# Patient Record
Sex: Female | Born: 1990 | Race: White | Hispanic: No | Marital: Married | Smoking: Never smoker
Health system: Southern US, Community
[De-identification: ages and names within clinical notes are randomized; demographics above are authoritative.]

## PROBLEM LIST (undated history)

## (undated) ENCOUNTER — Inpatient Hospital Stay (HOSPITAL_COMMUNITY): Payer: Self-pay

## (undated) DIAGNOSIS — O1490 Unspecified pre-eclampsia, unspecified trimester: Secondary | ICD-10-CM

## (undated) DIAGNOSIS — Z87442 Personal history of urinary calculi: Secondary | ICD-10-CM

## (undated) DIAGNOSIS — Z8759 Personal history of other complications of pregnancy, childbirth and the puerperium: Secondary | ICD-10-CM

## (undated) DIAGNOSIS — N921 Excessive and frequent menstruation with irregular cycle: Secondary | ICD-10-CM

## (undated) DIAGNOSIS — O2300 Infections of kidney in pregnancy, unspecified trimester: Secondary | ICD-10-CM

## (undated) DIAGNOSIS — D649 Anemia, unspecified: Secondary | ICD-10-CM

## (undated) DIAGNOSIS — N200 Calculus of kidney: Secondary | ICD-10-CM

## (undated) DIAGNOSIS — K5900 Constipation, unspecified: Secondary | ICD-10-CM

## (undated) DIAGNOSIS — N939 Abnormal uterine and vaginal bleeding, unspecified: Secondary | ICD-10-CM

## (undated) DIAGNOSIS — N83209 Unspecified ovarian cyst, unspecified side: Secondary | ICD-10-CM

## (undated) DIAGNOSIS — O139 Gestational [pregnancy-induced] hypertension without significant proteinuria, unspecified trimester: Secondary | ICD-10-CM

## (undated) HISTORY — PX: DILATION AND CURETTAGE OF UTERUS: SHX78

## (undated) HISTORY — DX: Gestational (pregnancy-induced) hypertension without significant proteinuria, unspecified trimester: O13.9

## (undated) HISTORY — DX: Unspecified pre-eclampsia, unspecified trimester: O14.90

## (undated) HISTORY — DX: Calculus of kidney: N20.0

## (undated) HISTORY — PX: TONSILLECTOMY: SUR1361

## (undated) HISTORY — PX: WISDOM TOOTH EXTRACTION: SHX21

---
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
## 2008-06-18 ENCOUNTER — Emergency Department: Payer: Self-pay | Admitting: Unknown Physician Specialty

## 2008-10-05 HISTORY — PX: TONSILLECTOMY AND ADENOIDECTOMY: SUR1326

## 2009-06-19 ENCOUNTER — Ambulatory Visit: Payer: Self-pay | Admitting: Otolaryngology

## 2009-11-22 ENCOUNTER — Ambulatory Visit: Payer: Self-pay | Admitting: Family Medicine

## 2010-01-16 ENCOUNTER — Ambulatory Visit: Payer: Self-pay | Admitting: Obstetrics & Gynecology

## 2010-01-20 ENCOUNTER — Encounter: Payer: Self-pay | Admitting: Obstetrics & Gynecology

## 2010-01-20 ENCOUNTER — Ambulatory Visit: Payer: Self-pay | Admitting: Obstetrics and Gynecology

## 2010-01-20 LAB — CONVERTED CEMR LAB
Antibody Screen: NEGATIVE
Basophils Absolute: 0 10*3/uL (ref 0.0–0.1)
Eosinophils Relative: 1 % (ref 0–5)
HCT: 42.5 % (ref 36.0–46.0)
Hemoglobin: 13.9 g/dL (ref 12.0–15.0)
Lymphocytes Relative: 22 % (ref 12–46)
MCHC: 32.7 g/dL (ref 30.0–36.0)
Monocytes Absolute: 0.4 10*3/uL (ref 0.1–1.0)
RDW: 15.7 % — ABNORMAL HIGH (ref 11.5–15.5)
Rh Type: NEGATIVE
hCG, Beta Chain, Quant, S: 212.6 milliintl units/mL

## 2010-02-03 ENCOUNTER — Encounter: Payer: Self-pay | Admitting: Obstetrics & Gynecology

## 2010-02-03 ENCOUNTER — Ambulatory Visit: Payer: Self-pay | Admitting: Obstetrics & Gynecology

## 2010-02-13 ENCOUNTER — Ambulatory Visit (HOSPITAL_COMMUNITY)
Admission: RE | Admit: 2010-02-13 | Discharge: 2010-02-13 | Payer: Self-pay | Source: Home / Self Care | Admitting: Obstetrics & Gynecology

## 2010-02-17 ENCOUNTER — Ambulatory Visit: Payer: Self-pay | Admitting: Obstetrics and Gynecology

## 2010-02-27 ENCOUNTER — Ambulatory Visit: Payer: Self-pay | Admitting: Obstetrics & Gynecology

## 2010-03-24 ENCOUNTER — Ambulatory Visit: Payer: Self-pay | Admitting: Obstetrics & Gynecology

## 2010-04-23 ENCOUNTER — Ambulatory Visit: Payer: Self-pay | Admitting: Obstetrics and Gynecology

## 2010-04-24 ENCOUNTER — Encounter: Payer: Self-pay | Admitting: Obstetrics & Gynecology

## 2010-04-30 ENCOUNTER — Ambulatory Visit (HOSPITAL_COMMUNITY)
Admission: RE | Admit: 2010-04-30 | Discharge: 2010-04-30 | Payer: Self-pay | Source: Home / Self Care | Admitting: Obstetrics & Gynecology

## 2010-05-22 ENCOUNTER — Ambulatory Visit: Payer: Self-pay | Admitting: Obstetrics & Gynecology

## 2010-06-24 ENCOUNTER — Ambulatory Visit: Payer: Self-pay | Admitting: Family Medicine

## 2010-06-24 ENCOUNTER — Encounter (INDEPENDENT_AMBULATORY_CARE_PROVIDER_SITE_OTHER): Payer: Self-pay | Admitting: *Deleted

## 2010-06-24 LAB — CONVERTED CEMR LAB
MCHC: 31.9 g/dL (ref 30.0–36.0)
MCV: 83.3 fL (ref 78.0–100.0)
Platelets: 285 10*3/uL (ref 150–400)
RDW: 14.8 % (ref 11.5–15.5)
WBC: 12 10*3/uL — ABNORMAL HIGH (ref 4.0–10.5)

## 2010-07-09 ENCOUNTER — Ambulatory Visit: Payer: Self-pay | Admitting: Family Medicine

## 2010-07-11 ENCOUNTER — Ambulatory Visit: Payer: Self-pay | Admitting: Family Medicine

## 2010-07-30 ENCOUNTER — Ambulatory Visit: Payer: Self-pay | Admitting: Obstetrics & Gynecology

## 2010-08-04 ENCOUNTER — Inpatient Hospital Stay (HOSPITAL_COMMUNITY): Admission: AD | Admit: 2010-08-04 | Discharge: 2010-08-04 | Payer: Self-pay | Admitting: Obstetrics and Gynecology

## 2010-08-04 ENCOUNTER — Ambulatory Visit: Payer: Self-pay | Admitting: Obstetrics and Gynecology

## 2010-08-05 ENCOUNTER — Encounter (INDEPENDENT_AMBULATORY_CARE_PROVIDER_SITE_OTHER): Payer: Self-pay | Admitting: *Deleted

## 2010-08-06 ENCOUNTER — Ambulatory Visit: Payer: Self-pay | Admitting: Obstetrics & Gynecology

## 2010-08-07 ENCOUNTER — Encounter (INDEPENDENT_AMBULATORY_CARE_PROVIDER_SITE_OTHER): Payer: Self-pay | Admitting: *Deleted

## 2010-08-07 ENCOUNTER — Ambulatory Visit: Payer: Self-pay | Admitting: Obstetrics & Gynecology

## 2010-08-07 LAB — CONVERTED CEMR LAB
Collection Interval-CRCL: 24 hr
Protein, Ur: 108 mg/24hr — ABNORMAL HIGH (ref 50–100)

## 2010-08-08 ENCOUNTER — Ambulatory Visit: Payer: Self-pay | Admitting: Obstetrics & Gynecology

## 2010-08-12 ENCOUNTER — Ambulatory Visit (HOSPITAL_COMMUNITY)
Admission: RE | Admit: 2010-08-12 | Discharge: 2010-08-12 | Payer: Self-pay | Source: Home / Self Care | Admitting: Family Medicine

## 2010-08-14 ENCOUNTER — Ambulatory Visit: Payer: Self-pay | Admitting: Obstetrics & Gynecology

## 2010-08-21 ENCOUNTER — Ambulatory Visit: Payer: Self-pay | Admitting: Obstetrics & Gynecology

## 2010-09-02 ENCOUNTER — Encounter (INDEPENDENT_AMBULATORY_CARE_PROVIDER_SITE_OTHER): Payer: Self-pay | Admitting: *Deleted

## 2010-09-02 ENCOUNTER — Ambulatory Visit: Payer: Self-pay | Admitting: Obstetrics & Gynecology

## 2010-09-02 LAB — CONVERTED CEMR LAB
Chlamydia, DNA Probe: NEGATIVE
GC Probe Amp, Genital: NEGATIVE

## 2010-09-08 ENCOUNTER — Ambulatory Visit: Payer: Self-pay | Admitting: Obstetrics and Gynecology

## 2010-09-11 ENCOUNTER — Ambulatory Visit: Payer: Self-pay | Admitting: Obstetrics & Gynecology

## 2010-09-14 ENCOUNTER — Inpatient Hospital Stay (HOSPITAL_COMMUNITY)
Admission: AD | Admit: 2010-09-14 | Discharge: 2010-09-14 | Payer: Self-pay | Source: Home / Self Care | Attending: Family Medicine | Admitting: Family Medicine

## 2010-09-16 ENCOUNTER — Ambulatory Visit: Payer: Self-pay | Admitting: Family Medicine

## 2010-09-17 ENCOUNTER — Ambulatory Visit: Payer: Self-pay | Admitting: Obstetrics & Gynecology

## 2010-09-18 ENCOUNTER — Inpatient Hospital Stay (HOSPITAL_COMMUNITY)
Admission: AD | Admit: 2010-09-18 | Discharge: 2010-09-21 | Payer: Self-pay | Source: Home / Self Care | Attending: Obstetrics & Gynecology | Admitting: Obstetrics & Gynecology

## 2010-09-18 ENCOUNTER — Ambulatory Visit: Payer: Self-pay | Admitting: Obstetrics and Gynecology

## 2010-10-26 ENCOUNTER — Encounter: Payer: Self-pay | Admitting: Obstetrics & Gynecology

## 2010-11-06 ENCOUNTER — Ambulatory Visit: Payer: Self-pay | Admitting: Obstetrics & Gynecology

## 2010-11-18 ENCOUNTER — Encounter: Payer: Self-pay | Admitting: Nurse Practitioner

## 2010-12-15 LAB — RH IMMUNE GLOB WKUP(>/=20WKS)(NOT WOMEN'S HOSP)
Fetal Screen: NEGATIVE
Unit division: 0

## 2010-12-15 LAB — PROTEIN / CREATININE RATIO, URINE
Creatinine, Urine: 116.8 mg/dL
Total Protein, Urine: 132 mg/dL

## 2010-12-15 LAB — URINALYSIS, ROUTINE W REFLEX MICROSCOPIC
Nitrite: NEGATIVE
Protein, ur: 100 mg/dL — AB
Urobilinogen, UA: 0.2 mg/dL (ref 0.0–1.0)

## 2010-12-15 LAB — CBC
HCT: 25.8 % — ABNORMAL LOW (ref 36.0–46.0)
Hemoglobin: 10.1 g/dL — ABNORMAL LOW (ref 12.0–15.0)
Hemoglobin: 9.8 g/dL — ABNORMAL LOW (ref 12.0–15.0)
MCH: 23.6 pg — ABNORMAL LOW (ref 26.0–34.0)
MCH: 23.7 pg — ABNORMAL LOW (ref 26.0–34.0)
MCHC: 32 g/dL (ref 30.0–36.0)
MCV: 72.7 fL — ABNORMAL LOW (ref 78.0–100.0)
Platelets: 332 10*3/uL (ref 150–400)
RBC: 3.55 MIL/uL — ABNORMAL LOW (ref 3.87–5.11)
RBC: 4.15 MIL/uL (ref 3.87–5.11)
RDW: 16.4 % — ABNORMAL HIGH (ref 11.5–15.5)
WBC: 14.8 10*3/uL — ABNORMAL HIGH (ref 4.0–10.5)

## 2010-12-15 LAB — URINE MICROSCOPIC-ADD ON

## 2010-12-15 LAB — COMPREHENSIVE METABOLIC PANEL
AST: 12 U/L (ref 0–37)
CO2: 24 mEq/L (ref 19–32)
Calcium: 9.2 mg/dL (ref 8.4–10.5)
Creatinine, Ser: 0.78 mg/dL (ref 0.4–1.2)
GFR calc Af Amer: >60 mL/min (ref >60)
GFR calc non Af Amer: >60 mL/min (ref >60)
Total Protein: 7.4 g/dL (ref 6.0–8.3)

## 2010-12-15 LAB — RPR: RPR Ser Ql: NONREACTIVE

## 2010-12-16 LAB — URINALYSIS, ROUTINE W REFLEX MICROSCOPIC
Bilirubin Urine: NEGATIVE
Glucose, UA: NEGATIVE mg/dL
Ketones, ur: NEGATIVE mg/dL
pH: 6 (ref 5.0–8.0)

## 2010-12-16 LAB — URINE MICROSCOPIC-ADD ON

## 2010-12-17 LAB — URINALYSIS, ROUTINE W REFLEX MICROSCOPIC
Bilirubin Urine: NEGATIVE
Glucose, UA: NEGATIVE mg/dL
Hgb urine dipstick: NEGATIVE
Specific Gravity, Urine: 1.01 (ref 1.005–1.030)
Urobilinogen, UA: 0.2 mg/dL (ref 0.0–1.0)
pH: 7 (ref 5.0–8.0)

## 2010-12-17 LAB — COMPREHENSIVE METABOLIC PANEL
ALT: 14 U/L (ref 0–35)
Albumin: 2.9 g/dL — ABNORMAL LOW (ref 3.5–5.2)
Alkaline Phosphatase: 80 U/L (ref 39–117)
BUN: 2 mg/dL — ABNORMAL LOW (ref 6–23)
Chloride: 103 mEq/L (ref 96–112)
Potassium: 4 mEq/L (ref 3.5–5.1)
Total Bilirubin: 0.3 mg/dL (ref 0.3–1.2)

## 2010-12-17 LAB — URINE MICROSCOPIC-ADD ON

## 2010-12-17 LAB — CBC
HCT: 32.5 % — ABNORMAL LOW (ref 36.0–46.0)
MCV: 80.2 fL (ref 78.0–100.0)
Platelets: 299 10*3/uL (ref 150–400)
RBC: 4.05 MIL/uL (ref 3.87–5.11)
WBC: 12.5 10*3/uL — ABNORMAL HIGH (ref 4.0–10.5)

## 2010-12-17 LAB — TSH: TSH: 1.537 u[IU]/mL (ref 0.350–4.500)

## 2011-12-15 ENCOUNTER — Ambulatory Visit (INDEPENDENT_AMBULATORY_CARE_PROVIDER_SITE_OTHER): Payer: BC Managed Care – PPO | Admitting: Family Medicine

## 2011-12-15 ENCOUNTER — Encounter: Payer: Self-pay | Admitting: Family Medicine

## 2011-12-15 DIAGNOSIS — Z1272 Encounter for screening for malignant neoplasm of vagina: Secondary | ICD-10-CM

## 2011-12-15 DIAGNOSIS — G43909 Migraine, unspecified, not intractable, without status migrainosus: Secondary | ICD-10-CM

## 2011-12-15 DIAGNOSIS — Z01419 Encounter for gynecological examination (general) (routine) without abnormal findings: Secondary | ICD-10-CM

## 2011-12-15 DIAGNOSIS — N920 Excessive and frequent menstruation with regular cycle: Secondary | ICD-10-CM

## 2011-12-15 DIAGNOSIS — Z113 Encounter for screening for infections with a predominantly sexual mode of transmission: Secondary | ICD-10-CM

## 2011-12-15 NOTE — Progress Notes (Signed)
Here today for GYN exam.  Needs pap smear and STD testing. She had a recent episode of bleeding for many days and 2 cycles in one month.  Previous cycles are average, monthly.  She is using condoms for [facility].  Does not think she is pregnant.  Past Medical History  Diagnosis Date  . Preeclampsia    History reviewed. No pertinent past surgical history. Family History  Problem Relation Age of Onset  . Cancer Mother     cervial  . Ovarian cysts Mother   . Cancer Maternal Aunt     cervical  . Cancer Maternal Grandmother 40    Cervical and Breast one breast  . Ovarian cysts Maternal Grandmother    History   Social History  . Marital Status: Married    Spouse Name: N/A    Number of Children: N/A  . Years of Education: N/A   Social History Main Topics  . Smoking status: Never Smoker   . Smokeless tobacco: Never Used  . Alcohol Use: No  . Drug Use: No  . Sexually Active: Yes -- Female partner(s)    Birth Control/ Protection: Condom   Other Topics Concern  . None   Social History Narrative  . None   No current outpatient prescriptions on file prior to visit.   Allergies  Allergen Reactions  . Codeine Hives   Review of Systems  Constitutional: Negative for fever, chills, weight loss and malaise/fatigue.  HENT: Negative for congestion.   Respiratory: Negative for sputum production and wheezing.   Cardiovascular: Negative for palpitations and leg swelling.  Gastrointestinal: Negative for nausea, vomiting, abdominal pain and blood in stool.  Genitourinary: Negative for dysuria, urgency and hematuria.  Musculoskeletal: Negative for back pain and joint pain.  Neurological: Negative for dizziness, speech change, focal weakness and loss of consciousness.  Psychiatric/Behavioral: Negative for depression. The patient is not nervous/anxious.   Physical Exam  Vitals reviewed. Constitutional: She is oriented to person, place, and time. She appears well-developed and well-nourished.    HENT:  Head: Normocephalic and atraumatic.  Eyes: No scleral icterus.  Neck: Neck supple. No thyromegaly present.  Cardiovascular: Normal rate and regular rhythm.   No murmur heard. Pulmonary/Chest: Effort normal and breath sounds normal. She has no wheezes.  Abdominal: Soft. She exhibits no distension. There is no tenderness.  Musculoskeletal: Normal range of motion.  Neurological: She is alert and oriented to person, place, and time.  Skin: Skin is warm and dry. No rash noted.  Psychiatric: She has a normal mood and affect.  Breasts - breasts appear normal, no suspicious masses, no skin or nipple changes or axillary nodes Pelvic - normal external genitalia, vulva, vagina, cervix, uterus and adnexa, VULVA: normal appearing vulva with no masses, tenderness or lesions, VAGINA: normal appearing vagina with normal color and discharge, no lesions, CERVIX: normal appearing cervix without discharge or lesions, UTERUS: uterus is normal size, shape, consistency and nontender, ADNEXA: normal adnexa in size, nontender and no masses  1. Migraine    2. Menorrhagia  TSH  3. Routine gynecological examination  Cytology - PAP   Watch cycles, if continue to be abnl, consider hormonal manipulation.

## 2011-12-15 NOTE — Progress Notes (Signed)
Patient is here today because she had two cycles this past month.  She bled for 17 days accompanied by fatigue.  She is getting hot flashes that make her feel like she has a fever.  She also gets a bad headache along with all of this.

## 2011-12-15 NOTE — Patient Instructions (Signed)

## 2011-12-16 LAB — TSH: TSH: 1.784 u[IU]/mL (ref 0.350–4.500)

## 2011-12-16 NOTE — Progress Notes (Signed)
Try calling patient . Patient phone number not in service.

## 2012-01-12 ENCOUNTER — Ambulatory Visit: Payer: BC Managed Care – PPO | Admitting: Family Medicine

## 2012-06-28 ENCOUNTER — Ambulatory Visit: Payer: BC Managed Care – PPO | Admitting: Obstetrics & Gynecology

## 2012-10-05 NOTE — L&D Delivery Note (Signed)
Delivery Note At 8:04 AM a viable female was delivered via Vaginal, Spontaneous Delivery (Presentation: ; Occiput Anterior).  APGAR: 9, 9; weight 6 lb 12.5 oz (3075 g).   Placenta status: Intact, Spontaneous.  Cord: 3 vessels with the following complications: None.  Cord pH: NA  Anesthesia: Epidural  Episiotomy: None Lacerations: Vaginal, hemostatic, no repair Suture Repair: NA Est. Blood Loss (mL): 200  Mom to postpartum.  Baby to nursery-stable. Placenta to: BS Feeding: Breast Circ: NA  Jmarion Christiano 06/07/2013, 9:37 AM

## 2012-10-31 ENCOUNTER — Ambulatory Visit: Payer: BC Managed Care – PPO | Admitting: Gynecology

## 2012-10-31 VITALS — BP 112/87 | Wt 164.0 lb

## 2012-10-31 DIAGNOSIS — Z3480 Encounter for supervision of other normal pregnancy, unspecified trimester: Secondary | ICD-10-CM

## 2012-10-31 LAB — POCT URINE PREGNANCY: Preg Test, Ur: NEGATIVE

## 2012-10-31 NOTE — Patient Instructions (Signed)
Patient was seen today for prenatal work up visit. Blood work was done and patient history was taken. Patient has a follow up appointment on 11/15/12 to see the doctor for her initial visit.

## 2012-11-01 ENCOUNTER — Encounter: Payer: Self-pay | Admitting: Family Medicine

## 2012-11-01 DIAGNOSIS — Z6791 Unspecified blood type, Rh negative: Secondary | ICD-10-CM | POA: Insufficient documentation

## 2012-11-01 DIAGNOSIS — O26899 Other specified pregnancy related conditions, unspecified trimester: Secondary | ICD-10-CM

## 2012-11-01 LAB — CULTURE, URINE COMPREHENSIVE
Colony Count: NO GROWTH
Organism ID, Bacteria: NO GROWTH

## 2012-11-02 LAB — OBSTETRIC PANEL
Basophils Absolute: 0.1 10*3/uL (ref 0.0–0.1)
Hepatitis B Surface Ag: NEGATIVE
Lymphocytes Relative: 26 % (ref 12–46)
Lymphs Abs: 2.2 10*3/uL (ref 0.7–4.0)
Neutrophils Relative %: 65 % (ref 43–77)
Platelets: 289 10*3/uL (ref 150–400)
RBC: 5.01 MIL/uL (ref 3.87–5.11)
RDW: 15.3 % (ref 11.5–15.5)
Rubella: 13.4 Index — ABNORMAL HIGH (ref <0.90)
WBC: 8.5 10*3/uL (ref 4.0–10.5)

## 2012-11-04 LAB — CYSTIC FIBROSIS DIAGNOSTIC STUDY

## 2012-11-13 ENCOUNTER — Ambulatory Visit: Payer: Self-pay | Admitting: Family Medicine

## 2012-11-14 ENCOUNTER — Other Ambulatory Visit: Payer: Self-pay | Admitting: Obstetrics & Gynecology

## 2012-11-14 ENCOUNTER — Telehealth: Payer: Self-pay | Admitting: Gynecology

## 2012-11-14 ENCOUNTER — Ambulatory Visit (HOSPITAL_COMMUNITY)
Admission: RE | Admit: 2012-11-14 | Discharge: 2012-11-14 | Disposition: A | Discharge status: Home / Self Care | Payer: 59 | Source: Ambulatory Visit | Attending: Obstetrics & Gynecology | Admitting: Obstetrics & Gynecology

## 2012-11-14 DIAGNOSIS — Z3480 Encounter for supervision of other normal pregnancy, unspecified trimester: Secondary | ICD-10-CM

## 2012-11-14 DIAGNOSIS — O3680X0 Pregnancy with inconclusive fetal viability, not applicable or unspecified: Secondary | ICD-10-CM | POA: Insufficient documentation

## 2012-11-14 DIAGNOSIS — K829 Disease of gallbladder, unspecified: Secondary | ICD-10-CM

## 2012-11-14 DIAGNOSIS — O2620 Pregnancy care for patient with recurrent pregnancy loss, unspecified trimester: Secondary | ICD-10-CM | POA: Insufficient documentation

## 2012-11-14 NOTE — Telephone Encounter (Signed)
Message receive from Dr. Elsie Lincoln to order an tranvag. ultrasoud and ref. Patient to [facility].

## 2012-11-15 ENCOUNTER — Encounter: Payer: Self-pay | Admitting: Family Medicine

## 2012-11-15 ENCOUNTER — Ambulatory Visit (INDEPENDENT_AMBULATORY_CARE_PROVIDER_SITE_OTHER): Payer: 59 | Admitting: Family Medicine

## 2012-11-15 VITALS — BP 124/92 | Wt 163.0 lb

## 2012-11-15 DIAGNOSIS — O360990 Maternal care for other rhesus isoimmunization, unspecified trimester, not applicable or unspecified: Secondary | ICD-10-CM

## 2012-11-15 DIAGNOSIS — K8020 Calculus of gallbladder without cholecystitis without obstruction: Secondary | ICD-10-CM | POA: Insufficient documentation

## 2012-11-15 DIAGNOSIS — Z23 Encounter for immunization: Secondary | ICD-10-CM

## 2012-11-15 DIAGNOSIS — Z3480 Encounter for supervision of other normal pregnancy, unspecified trimester: Secondary | ICD-10-CM

## 2012-11-15 DIAGNOSIS — O0990 Supervision of high risk pregnancy, unspecified, unspecified trimester: Secondary | ICD-10-CM | POA: Insufficient documentation

## 2012-11-15 DIAGNOSIS — O09299 Supervision of pregnancy with other poor reproductive or obstetric history, unspecified trimester: Secondary | ICD-10-CM | POA: Insufficient documentation

## 2012-11-15 DIAGNOSIS — O26899 Other specified pregnancy related conditions, unspecified trimester: Secondary | ICD-10-CM

## 2012-11-15 MED ORDER — INFLUENZA VIRUS VACC SPLIT PF IM SUSP: 0.5000 mL | Freq: Once | INTRAMUSCULAR | Status: DC

## 2012-11-15 NOTE — Patient Instructions (Addendum)
Pregnancy - First Trimester During sexual intercourse, millions of sperm go into the vagina. Only 1 sperm will penetrate and fertilize the female egg while it is in the Fallopian tube. One week later, the fertilized egg implants into the wall of the uterus. An embryo begins to develop into a baby. At 6 to 8 weeks, the eyes and face are formed and the heartbeat can be seen on ultrasound. At the end of 12 weeks (first trimester), all the baby's organs are formed. Now that you are pregnant, you will want to do everything you can to have a healthy baby. Two of the most important things are to get good prenatal care and follow your caregiver's instructions. Prenatal care is all the medical care you receive before the baby's birth. It is given to prevent, find, and treat problems during the pregnancy and childbirth. PRENATAL EXAMS  During prenatal visits, your weight, blood pressure and urine are checked. This is done to make sure you are healthy and progressing normally during the pregnancy.  A pregnant woman should gain 25 to 35 pounds during the pregnancy. However, if you are over weight or underweight, your caregiver will advise you regarding your weight.  Your caregiver will ask and answer questions for you.  Blood work, cervical cultures, other necessary tests and a Pap test are done during your prenatal exams. These tests are done to check on your health and the probable health of your baby. Tests are strongly recommended and done for HIV with your permission. This is the virus that causes AIDS. These tests are done because medications can be given to help prevent your baby from being born with this infection should you have been infected without knowing it. Blood work is also used to find out your blood type, previous infections and follow your blood levels (hemoglobin).  Low hemoglobin (anemia) is common during pregnancy. Iron and vitamins are given to help prevent this. Later in the pregnancy,  blood tests for diabetes will be done along with any other tests if any problems develop. You may need tests to make sure you and the baby are doing well.  You may need other tests to make sure you and the baby are doing well. CHANGES DURING THE FIRST TRIMESTER (THE FIRST 3 MONTHS OF PREGNANCY) Your body goes through many changes during pregnancy. They vary from person to person. Talk to your caregiver about changes you notice and are concerned about. Changes can include:  Your menstrual period stops.  The egg and sperm carry the genes that determine what you look like. Genes from you and your partner are forming a baby. The female genes determine whether the baby is a boy or a girl.  Your body increases in girth and you may feel bloated.  Feeling sick to your stomach (nauseous) and throwing up (vomiting). If the vomiting is uncontrollable, call your caregiver.  Your breasts will begin to enlarge and become tender.  Your nipples may stick out more and become darker.  The need to urinate more. Painful urination may mean you have a bladder infection.  Tiring easily.  Loss of appetite.  Cravings for certain kinds of food.  At first, you may gain or lose a couple of pounds.  You may have changes in your emotions from day to day (excited to be pregnant or concerned something may go wrong with the pregnancy and baby).  You may have more vivid and strange dreams. HOME CARE INSTRUCTIONS   It is very important   to avoid all smoking, alcohol and un-prescribed drugs during your pregnancy. These affect the formation and growth of the baby. Avoid chemicals while pregnant to ensure the delivery of a healthy infant.  Start your prenatal visits by the 12th week of pregnancy. They are usually scheduled monthly at first, then more often in the last 2 months before delivery. Keep your caregiver's appointments. Follow your caregiver's instructions regarding medication use, blood and lab tests, exercise,  and diet.  During pregnancy, you are providing food for you and your baby. Eat regular, well-balanced meals. Choose foods such as meat, fish, milk and other low fat dairy products, vegetables, fruits, and whole-grain breads and cereals. Your caregiver will tell you of the ideal weight gain.  You can help morning sickness by keeping soda crackers at the bedside. Eat a couple before arising in the morning. You may want to use the crackers without salt on them.  Eating 4 to 5 small meals rather than 3 large meals a day also may help the nausea and vomiting.  Drinking liquids between meals instead of during meals also seems to help nausea and vomiting.  A physical sexual relationship may be continued throughout pregnancy if there are no other problems. Problems may be early (premature) leaking of amniotic fluid from the membranes, vaginal bleeding, or belly (abdominal) pain.  Exercise regularly if there are no restrictions. Check with your caregiver or physical therapist if you are unsure of the safety of some of your exercises. Greater weight gain will occur in the last 2 trimesters of pregnancy. Exercising will help:  Control your weight.  Keep you in shape.  Prepare you for labor and delivery.  Help you lose your pregnancy weight after you deliver your baby.  Wear a good support or jogging bra for breast tenderness during pregnancy. This may help if worn during sleep too.  Ask when prenatal classes are available. Begin classes when they are offered.  Do not use hot tubs, steam rooms or saunas.  Wear your seat belt when driving. This protects you and your baby if you are in an accident.  Avoid raw meat, uncooked cheese, cat litter boxes and soil used by cats throughout the pregnancy. These carry germs that can cause birth defects in the baby.  The first trimester is a good time to visit your dentist for your dental health. Getting your teeth cleaned is OK. Use a softer toothbrush and  brush gently during pregnancy.  Ask for help if you have financial, counseling or nutritional needs during pregnancy. Your caregiver will be able to offer counseling for these needs as well as refer you for other special needs.  Do not take any medications or herbs unless told by your caregiver.  Inform your caregiver if there is any mental or physical domestic violence.  Make a list of emergency phone numbers of family, friends, hospital, and police and fire departments.  Write down your questions. Take them to your prenatal visit.  Do not douche.  Do not cross your legs.  If you have to stand for long periods of time, rotate you feet or take small steps in a circle.  You may have more vaginal secretions that may require a sanitary pad. Do not use tampons or scented sanitary pads. MEDICATIONS AND DRUG USE IN PREGNANCY  Take prenatal vitamins as directed. The vitamin should contain 1 milligram of folic acid. Keep all vitamins out of reach of children. Only a couple vitamins or tablets containing iron may be   fatal to a baby or young child when ingested.  Avoid use of all medications, including herbs, over-the-counter medications, not prescribed or suggested by your caregiver. Only take over-the-counter or prescription medicines for pain, discomfort, or fever as directed by your caregiver. Do not use aspirin, ibuprofen, or naproxen unless directed by your caregiver.  Let your caregiver also know about herbs you may be using.  Alcohol is related to a number of birth defects. This includes fetal alcohol syndrome. All alcohol, in any form, should be avoided completely. Smoking will cause low birth rate and premature babies.  Street or illegal drugs are very harmful to the baby. They are absolutely forbidden. A baby born to an addicted mother will be addicted at birth. The baby will go through the same withdrawal an adult does.  Let your caregiver know about any medications that you have to  take and for what reason you take them. MISCARRIAGE IS COMMON DURING PREGNANCY A miscarriage does not mean you did something wrong. It is not a reason to worry about getting pregnant again. Your caregiver will help you with questions you may have. If you have a miscarriage, you may need minor surgery. SEEK MEDICAL CARE IF:  You have any concerns or worries during your pregnancy. It is better to call with your questions if you feel they cannot wait, rather than worry about them. SEEK IMMEDIATE MEDICAL CARE IF:   An unexplained oral temperature above 102 F (38.9 C) develops, or as your caregiver suggests.  You have leaking of fluid from the vagina (birth canal). If leaking membranes are suspected, take your temperature and inform your caregiver of this when you call.  There is vaginal spotting or bleeding. Notify your caregiver of the amount and how many pads are used.  You develop a bad smelling vaginal discharge with a change in the color.  You continue to feel sick to your stomach (nauseated) and have no relief from remedies suggested. You vomit blood or coffee ground-like materials.  You lose more than 2 pounds of weight in 1 week.  You gain more than 2 pounds of weight in 1 week and you notice swelling of your face, hands, feet, or legs.  You gain 5 pounds or more in 1 week (even if you do not have swelling of your hands, face, legs, or feet).  You get exposed to German measles and have never had them.  You are exposed to fifth disease or chickenpox.  You develop belly (abdominal) pain. Round ligament discomfort is a common non-cancerous (benign) cause of abdominal pain in pregnancy. Your caregiver still must evaluate this.  You develop headache, fever, diarrhea, pain with urination, or shortness of breath.  You fall or are in a car accident or have any kind of trauma.  There is mental or physical violence in your home. Document Released: 09/15/2001 Document Revised: 12/14/2011  Document Reviewed: 03/19/2009 Exit[employer] Patient Information 2013 [employer].  Breastfeeding Deciding to breastfeed is one of the best choices you can make for you and your baby. The information that follows gives a brief overview of the benefits of breastfeeding as well as common topics surrounding breastfeeding. BENEFITS OF BREASTFEEDING For the baby  The first milk (colostrum) helps the baby's digestive system function better.   There are antibodies in the mother's milk that help the baby fight off infections.   The baby has a lower incidence of asthma, allergies, and sudden infant death syndrome (SIDS).   The nutrients in breast milk   are better for the baby than infant formulas, and breast milk helps the baby's brain grow better.   Babies who breastfeed have less gas, colic, and constipation.  For the mother  Breastfeeding helps develop a very special bond between the mother and her baby.   Breastfeeding is convenient, always available at the correct temperature, and costs nothing.   Breastfeeding burns calories in the mother and helps her lose weight that was gained during pregnancy.   Breastfeeding makes the uterus contract back down to normal size faster and slows bleeding following delivery.   Breastfeeding mothers have a lower risk of developing breast cancer.  BREASTFEEDING FREQUENCY  A healthy, full-term baby may breastfeed as often as every hour or space his or her feedings to every 3 hours.   Watch your baby for signs of hunger. Nurse your baby if he or she shows signs of hunger. How often you nurse will vary from baby to baby.   Nurse as often as the baby requests, or when you feel the need to reduce the fullness of your breasts.   Awaken the baby if it has been 3 4 hours since the last feeding.   Frequent feeding will help the mother make more milk and will help prevent problems, such as sore nipples and engorgement of the breasts.  BABY'S  POSITION AT THE BREAST  Whether lying down or sitting, be sure that the baby's tummy is facing your tummy.   Support the breast with 4 fingers underneath the breast and the thumb above. Make sure your fingers are well away from the nipple and baby's mouth.   Stroke the baby's lips gently with your finger or nipple.   When the baby's mouth is open wide enough, place all of your nipple and as much of the areola as possible into your baby's mouth.   Pull the baby in close so the tip of the nose and the baby's cheeks touch the breast during the feeding.  FEEDINGS AND SUCTION  The length of each feeding varies from baby to baby and from feeding to feeding.   The baby must suck about 2 3 minutes for your milk to get to him or her. This is called a "let down." For this reason, allow the baby to feed on each breast as long as he or she wants. Your baby will end the feeding when he or she has received the right balance of nutrients.   To break the suction, put your finger into the corner of the baby's mouth and slide it between his or her gums before removing your breast from his or her mouth. This will help prevent sore nipples.  HOW TO TELL WHETHER YOUR BABY IS GETTING ENOUGH BREAST MILK. Wondering whether or not your baby is getting enough milk is a common concern among mothers. You can be assured that your baby is getting enough milk if:   Your baby is actively sucking and you hear swallowing.   Your baby seems relaxed and satisfied after a feeding.   Your baby nurses at least 8 12 times in a 24 hour time period. Nurse your baby until he or she unlatches or falls asleep at the first breast (at least 10 20 minutes), then offer the second side.   Your baby is wetting 5 6 disposable diapers (6 8 cloth diapers) in a 24 hour period by 5 6 days of age.   Your baby is having at least 3 4 stools every 24 hours   for the first 6 weeks. The stool should be soft and yellow.   Your baby  should gain 4 7 ounces per week after he or she is 4 days old.   Your breasts feel softer after nursing.  REDUCING BREAST ENGORGEMENT  In the first week after your baby is born, you may experience signs of breast engorgement. When breasts are engorged, they feel heavy, warm, full, and may be tender to the touch. You can reduce engorgement if you:   Nurse frequently, every 2 3 hours. Mothers who breastfeed early and often have fewer problems with engorgement.   Place light ice packs on your breasts for 10 20 minutes between feedings. This reduces swelling. Wrap the ice packs in a lightweight towel to protect your skin. Bags of frozen vegetables work well for this purpose.   Take a warm shower or apply warm, moist heat to your breast for 5 10 minutes just before each feeding. This increases circulation and helps the milk flow.   Gently massage your breast before and during the feeding. Using your finger tips, massage from the chest wall towards your nipple in a circular motion.   Make sure that the baby empties at least one breast at every feeding before switching sides.   Use a breast pump to empty the breasts if your baby is sleepy or not nursing well. You may also want to pump if you are returning to work oryou feel you are getting engorged.   Avoid bottle feeds, pacifiers, or supplemental feedings of water or juice in place of breastfeeding. Breast milk is all the food your baby needs. It is not necessary for your baby to have water or formula. In fact, to help your breasts make more milk, it is best not to give your baby supplemental feedings during the early weeks.   Be sure the baby is latched on and positioned properly while breastfeeding.   Wear a supportive bra, avoiding underwire styles.   Eat a balanced diet with enough fluids.   Rest often, relax, and take your prenatal vitamins to prevent fatigue, stress, and anemia.  If you follow these suggestions, your  engorgement should improve in 24 48 hours. If you are still experiencing difficulty, call your lactation consultant or caregiver.  CARING FOR YOURSELF Take care of your breasts  Bathe or shower daily.   Avoid using soap on your nipples.   Start feedings on your left breast at one feeding and on your right breast at the next feeding.   You will notice an increase in your milk supply 2 5 days after delivery. You may feel some discomfort from engorgement, which makes your breasts very firm and often tender. Engorgement "peaks" out within 24 48 hours. In the meantime, apply warm moist towels to your breasts for 5 10 minutes before feeding. Gentle massage and expression of some milk before feeding will soften your breasts, making it easier for your baby to latch on.   Wear a well-fitting nursing bra, and air dry your nipples for a 3 4minutes after each feeding.   Only use cotton bra pads.   Only use pure lanolin on your nipples after nursing. You do not need to wash it off before feeding the baby again. Another option is to express a few drops of breast milk and gently massage it into your nipples.  Take care of yourself  Eat well-balanced meals and nutritious snacks.   Drinking milk, fruit juice, and water to satisfy your thirst (  about 8 glasses a day).   Get plenty of rest.  Avoid foods that you notice affect the baby in a bad way.  SEEK MEDICAL CARE IF:   You have difficulty with breastfeeding and need help.   You have a hard, red, sore area on your breast that is accompanied by a fever.   Your baby is too sleepy to eat well or is having trouble sleeping.   Your baby is wetting less than 6 diapers a day, by 5 days of age.   Your baby's skin or white part of his or her eyes is more yellow than it was in the hospital.   You feel depressed.  Document Released: 09/21/2005 Document Revised: 03/22/2012 Document Reviewed: 12/20/2011 [employer] Patient Information 2013  [employer].  

## 2012-11-15 NOTE — Progress Notes (Signed)
  Subjective:    Angel Pratt is a 22 y.o. [phone] [redacted]w[redacted]d being seen today for her obstetrical visit.  Patient reports nausea, vomiting and RUQ pain and requiring Vicodin.  Reports being seen in [facility] for gall stones..   Fetal movement: absent.  Past Medical History  Diagnosis Date  . Preeclampsia   . Pregnancy induced hypertension   . Cholelithiasis    Past Surgical History  Procedure Laterality Date  . Tonsillectomy      2009   Family History  Problem Relation Age of Onset  . Cancer Mother     cervial dysplasia  . Ovarian cysts Mother   . Cancer Maternal Aunt     cervical  . Cancer Maternal Grandmother 40    Cervical and Breast one breast  . Ovarian cysts Maternal Grandmother   . Diabetes Maternal Grandfather    History   Social History  . Marital Status: Married    Spouse Name: N/A    Number of Children: N/A  . Years of Education: N/A   Occupational History  . Not on file.   Social History Main Topics  . Smoking status: Never Smoker   . Smokeless tobacco: Never Used  . Alcohol Use: No  . Drug Use: No  . Sexually Active: Yes -- Female partner(s)    Birth Control/ Protection: Condom   Other Topics Concern  . Not on file   Social History Narrative  . No narrative on file   No current outpatient prescriptions on file prior to visit.   No current facility-administered medications on file prior to visit.   Allergies  Allergen Reactions  . Codeine Hives  . Latex Hives   OB History   Grav Para Term Preterm Abortions TAB SAB Ect Mult Living   5 1 1  3  3   1      Genetic and OB hx reviewed.  Objective:    BP 124/92  Wt 163 lb (73.936 kg)  BMI 29.81 kg/m2  LMP 09/01/2012  Physical Exam  Exam Physical Examination: General appearance - alert, well appearing, and in no distress CV-RRR no r/g/m Lungs- clear bilaterally Abd-soft, + RUQ tenderness, no rebound, no guarding. Ext-no c/c/e  Assessment:    Pregnancy:  [id number]    Plan:    Patient  Active Problem List  Diagnosis  . Rh negative state in antepartum period  . Cholelithiasis  . Supervision of other normal pregnancy  . Hx of preeclampsia, prior pregnancy, currently pregnant   To consider genetic testing. Discussed surgical options with Gen Surg.  We prefer elective cases in the second trimester after 13 wks, if possible.  For emergencies, please do everything necessary to treat the patient accordingly.

## 2012-11-15 NOTE — Progress Notes (Signed)
p-88 

## 2012-11-21 ENCOUNTER — Telehealth: Payer: Self-pay | Admitting: *Deleted

## 2012-11-21 DIAGNOSIS — IMO0002 Reserved for concepts with insufficient information to code with codable children: Secondary | ICD-10-CM

## 2012-11-21 MED ORDER — RABEPRAZOLE SODIUM 20 MG PO TBEC: 20.0000 mg | DELAYED_RELEASE_TABLET | Freq: Every day | ORAL | Status: DC

## 2012-11-21 NOTE — Telephone Encounter (Signed)
Patient called and stated that she was not going to need surgery, that her gallbladder was just inflamed due to an ulcer and Dr. Wynelle Bourgeois would be calling to confirm this and to let us know what medication he would like to prescribe to her.    Dr. Wynelle Bourgeois the surgeon called and did confirm this information and asked if we could give her a prescription for Aciphex or any other high strenth acid reducer to help heal the ulcer.

## 2012-12-14 ENCOUNTER — Encounter: Payer: Self-pay | Admitting: Family Medicine

## 2012-12-14 ENCOUNTER — Ambulatory Visit (INDEPENDENT_AMBULATORY_CARE_PROVIDER_SITE_OTHER): Payer: 59 | Admitting: Family Medicine

## 2012-12-14 VITALS — BP 129/82 | Wt 161.6 lb

## 2012-12-14 DIAGNOSIS — Z3481 Encounter for supervision of other normal pregnancy, first trimester: Secondary | ICD-10-CM

## 2012-12-14 DIAGNOSIS — Z3480 Encounter for supervision of other normal pregnancy, unspecified trimester: Secondary | ICD-10-CM

## 2012-12-14 NOTE — Progress Notes (Signed)
P = 97  Pt reports daily vomiting and nausea after eating

## 2012-12-14 NOTE — Progress Notes (Signed)
Surgeon put her on Aciphex for presumed PUD. Not taking anything for nausea at present.--Unisom and b6 3 lb weight loss total. Schedule first screen.

## 2012-12-14 NOTE — Patient Instructions (Addendum)
Morning Sickness Morning sickness is when you feel sick to your stomach (nauseous) during pregnancy. This nauseous feeling may or may not come with throwing up (vomiting). It often occurs in the morning, but can be a problem any time of day. While morning sickness is unpleasant, it is usually harmless unless you develop severe and continual vomiting (hyperemesis gravidarum). This condition requires more intense treatment. CAUSES  The cause of morning sickness is not completely known but seems to be related to a sudden increase of two hormones:   Human chorionic gonadotropin (hCG).  Estrogen hormone. These are elevated in the first part of the pregnancy. TREATMENT  Do not use any medicines (prescription, over-the-counter, or herbal) for morning sickness without first talking to your caregiver. Some patients are helped by the following:  Vitamin B6 (25mg  every 8 hours)   An antihistamine called doxylamine Unisom (10mg  every 8 hours).  The herbal medication ginger. HOME CARE INSTRUCTIONS   Taking multivitamins before getting pregnant can prevent or decrease the severity of morning sickness in most women.  Eat a piece of dry toast or unsalted crackers before getting out of bed in the morning.  Eat 5 or 6 small meals a day.  Eat dry and bland foods (rice, baked potato).  Do not drink liquids with your meals. Drink liquids between meals.  Avoid greasy, fatty, and spicy foods.  Get someone to cook for you if the smell of any food causes nausea and vomiting.  Avoid vitamin pills with iron because iron can cause nausea.  Snack on protein foods between meals if you are hungry.  Eat unsweetened gelatins for deserts.  Wear an acupressure wristband (worn for sea sickness) may be helpful.  Acupuncture may be helpful.  Do not smoke.  Get a humidifier to keep the air in your house free of odors. SEEK MEDICAL CARE IF:   Your home remedies are not working and you need medication.  You  feel dizzy or lightheaded.  You are losing weight.  You need help with your diet. SEEK IMMEDIATE MEDICAL CARE IF:   You have persistent and uncontrolled nausea and vomiting.  You pass out (faint).  You have a fever. MAKE SURE YOU:   Understand these instructions.  Will watch your condition.  Will get help right away if you are not doing well or get worse. Document Released: 11/12/2006 Document Revised: 12/14/2011 Document Reviewed: 09/09/2007 [facility] Patient Information 2013 [place], [place].  Pregnancy - Second Trimester The second trimester of pregnancy (3 to 6 months) is a period of rapid growth for you and your baby. At the end of the sixth month, your baby is about 9 inches long and weighs 1 1/2 pounds. You will begin to feel the baby move between 18 and 20 weeks of the pregnancy. This is called quickening. Weight gain is faster. A clear fluid (colostrum) may leak out of your breasts. You may feel small contractions of the womb (uterus). This is known as false labor or Braxton-Hicks contractions. This is like a practice for labor when the baby is ready to be born. Usually, the problems with morning sickness have usually passed by the end of your first trimester. Some women develop small dark blotches (called cholasma, mask of pregnancy) on their face that usually goes away after the baby is born. Exposure to the sun makes the blotches worse. Acne may also develop in some pregnant women and pregnant women who have acne, may find that it goes away. PRENATAL EXAMS  Blood  work may continue to be done during prenatal exams. These tests are done to check on your health and the probable health of your baby. Blood work is used to follow your blood levels (hemoglobin). Anemia (low hemoglobin) is common during pregnancy. Iron and vitamins are given to help prevent this. You will also be checked for diabetes between 24 and 28 weeks of the pregnancy. Some of the previous blood tests may be  repeated.  The size of the uterus is measured during each visit. This is to make sure that the baby is continuing to grow properly according to the dates of the pregnancy.  Your blood pressure is checked every prenatal visit. This is to make sure you are not getting toxemia.  Your urine is checked to make sure you do not have an infection, diabetes or protein in the urine.  Your weight is checked often to make sure gains are happening at the suggested rate. This is to ensure that both you and your baby are growing normally.  Sometimes, an ultrasound is performed to confirm the proper growth and development of the baby. This is a test which bounces harmless sound waves off the baby so your caregiver can more accurately determine due dates. Sometimes, a specialized test is done on the amniotic fluid surrounding the baby. This test is called an amniocentesis. The amniotic fluid is obtained by sticking a needle into the belly (abdomen). This is done to check the chromosomes in instances where there is a concern about possible genetic problems with the baby. It is also sometimes done near the end of pregnancy if an early delivery is required. In this case, it is done to help make sure the baby's lungs are mature enough for the baby to live outside of the womb. CHANGES OCCURING IN THE SECOND TRIMESTER OF PREGNANCY Your body goes through many changes during pregnancy. They vary from person to person. Talk to your caregiver about changes you notice that you are concerned about.  During the second trimester, you will likely have an increase in your appetite. It is normal to have cravings for certain foods. This varies from person to person and pregnancy to pregnancy.  Your lower abdomen will begin to bulge.  You may have to urinate more often because the uterus and baby are pressing on your bladder. It is also common to get more bladder infections during pregnancy (pain with urination). You can help this  by drinking lots of fluids and emptying your bladder before and after intercourse.  You may begin to get stretch marks on your hips, abdomen, and breasts. These are normal changes in the body during pregnancy. There are no exercises or medications to take that prevent this change.  You may begin to develop swollen and bulging veins (varicose veins) in your legs. Wearing support hose, elevating your feet for 15 minutes, 3 to 4 times a day and limiting salt in your diet helps lessen the problem.  Heartburn may develop as the uterus grows and pushes up against the stomach. Antacids recommended by your caregiver helps with this problem. Also, eating smaller meals 4 to 5 times a day helps.  Constipation can be treated with a stool softener or adding bulk to your diet. Drinking lots of fluids, vegetables, fruits, and whole grains are helpful.  Exercising is also helpful. If you have been very active up until your pregnancy, most of these activities can be continued during your pregnancy. If you have been less active, it is  helpful to start an exercise program such as walking.  Hemorrhoids (varicose veins in the rectum) may develop at the end of the second trimester. Warm sitz baths and hemorrhoid cream recommended by your caregiver helps hemorrhoid problems.  Backaches may develop during this time of your pregnancy. Avoid heavy lifting, wear low heal shoes and practice good posture to help with backache problems.  Some pregnant women develop tingling and numbness of their hand and fingers because of swelling and tightening of ligaments in the wrist (carpel tunnel syndrome). This goes away after the baby is born.  As your breasts enlarge, you may have to get a bigger bra. Get a comfortable, cotton, support bra. Do not get a nursing bra until the last month of the pregnancy if you will be nursing the baby.  You may get a dark line from your belly button to the pubic area called the linea nigra.  You  may develop rosy cheeks because of increase blood flow to the face.  You may develop spider looking lines of the face, neck, arms and chest. These go away after the baby is born. HOME CARE INSTRUCTIONS   It is extremely important to avoid all smoking, herbs, alcohol, and unprescribed drugs during your pregnancy. These chemicals affect the formation and growth of the baby. Avoid these chemicals throughout the pregnancy to ensure the delivery of a healthy infant.  Most of your home care instructions are the same as suggested for the first trimester of your pregnancy. Keep your caregiver's appointments. Follow your caregiver's instructions regarding medication use, exercise and diet.  During pregnancy, you are providing food for you and your baby. Continue to eat regular, well-balanced meals. Choose foods such as meat, fish, milk and other low fat dairy products, vegetables, fruits, and whole-grain breads and cereals. Your caregiver will tell you of the ideal weight gain.  A physical sexual relationship may be continued up until near the end of pregnancy if there are no other problems. Problems could include early (premature) leaking of amniotic fluid from the membranes, vaginal bleeding, abdominal pain, or other medical or pregnancy problems.  Exercise regularly if there are no restrictions. Check with your caregiver if you are unsure of the safety of some of your exercises. The greatest weight gain will occur in the last 2 trimesters of pregnancy. Exercise will help you:  Control your weight.  Get you in shape for labor and delivery.  Lose weight after you have the baby.  Wear a good support or jogging bra for breast tenderness during pregnancy. This may help if worn during sleep. Pads or tissues may be used in the bra if you are leaking colostrum.  Do not use hot tubs, steam rooms or saunas throughout the pregnancy.  Wear your seat belt at all times when driving. This protects you and your  baby if you are in an accident.  Avoid raw meat, uncooked cheese, cat litter boxes and soil used by cats. These carry germs that can cause birth defects in the baby.  The second trimester is also a good time to visit your dentist for your dental health if this has not been done yet. Getting your teeth cleaned is OK. Use a soft toothbrush. Brush gently during pregnancy.  It is easier to loose urine during pregnancy. Tightening up and strengthening the pelvic muscles will help with this problem. Practice stopping your urination while you are going to the bathroom. These are the same muscles you need to strengthen. It is  also the muscles you would use as if you were trying to stop from passing gas. You can practice tightening these muscles up 10 times a set and repeating this about 3 times per day. Once you know what muscles to tighten up, do not perform these exercises during urination. It is more likely to contribute to an infection by backing up the urine.  Ask for help if you have financial, counseling or nutritional needs during pregnancy. Your caregiver will be able to offer counseling for these needs as well as refer you for other special needs.  Your skin may become oily. If so, wash your face with mild soap, use non-greasy moisturizer and oil or cream based makeup. MEDICATIONS AND DRUG USE IN PREGNANCY  Take prenatal vitamins as directed. The vitamin should contain 1 milligram of folic acid. Keep all vitamins out of reach of children. Only a couple vitamins or tablets containing iron may be fatal to a baby or young child when ingested.  Avoid use of all medications, including herbs, over-the-counter medications, not prescribed or suggested by your caregiver. Only take over-the-counter or prescription medicines for pain, discomfort, or fever as directed by your caregiver. Do not use aspirin.  Let your caregiver also know about herbs you may be using.  Alcohol is related to a number of birth  defects. This includes fetal alcohol syndrome. All alcohol, in any form, should be avoided completely. Smoking will cause low birth rate and premature babies.  Street or illegal drugs are very harmful to the baby. They are absolutely forbidden. A baby born to an addicted mother will be addicted at birth. The baby will go through the same withdrawal an adult does. SEEK MEDICAL CARE IF:  You have any concerns or worries during your pregnancy. It is better to call with your questions if you feel they cannot wait, rather than worry about them. SEEK IMMEDIATE MEDICAL CARE IF:   An unexplained oral temperature above 102 F (38.9 C) develops, or as your caregiver suggests.  You have leaking of fluid from the vagina (birth canal). If leaking membranes are suspected, take your temperature and tell your caregiver of this when you call.  There is vaginal spotting, bleeding, or passing clots. Tell your caregiver of the amount and how many pads are used. Light spotting in pregnancy is common, especially following intercourse.  You develop a bad smelling vaginal discharge with a change in the color from clear to white.  You continue to feel sick to your stomach (nauseated) and have no relief from remedies suggested. You vomit blood or coffee ground-like materials.  You lose more than 2 pounds of weight or gain more than 2 pounds of weight over 1 week, or as suggested by your caregiver.  You notice swelling of your face, hands, feet, or legs.  You get exposed to [place] measles and have never had them.  You are exposed to fifth disease or chickenpox.  You develop belly (abdominal) pain. Round ligament discomfort is a common non-cancerous (benign) cause of abdominal pain in pregnancy. Your caregiver still must evaluate you.  You develop a bad headache that does not go away.  You develop fever, diarrhea, pain with urination, or shortness of breath.  You develop visual problems, blurry, or double  vision.  You fall or are in a car accident or any kind of trauma.  There is mental or physical violence at home. Document Released: 09/15/2001 Document Revised: 12/14/2011 Document Reviewed: 03/20/2009 [facility] Patient Information 2013 [place], [place].  Breastfeeding Deciding to breastfeed is one of the best choices you can make for you and your baby. The information that follows gives a brief overview of the benefits of breastfeeding as well as common topics surrounding breastfeeding. BENEFITS OF BREASTFEEDING For the baby  The first milk (colostrum) helps the baby's digestive system function better.   There are antibodies in the mother's milk that help the baby fight off infections.   The baby has a lower incidence of asthma, allergies, and sudden infant death syndrome (SIDS).   The nutrients in breast milk are better for the baby than infant formulas, and breast milk helps the baby's brain grow better.   Babies who breastfeed have less gas, colic, and constipation.  For the mother  Breastfeeding helps develop a very special bond between the mother and her baby.   Breastfeeding is convenient, always available at the correct temperature, and costs nothing.   Breastfeeding burns calories in the mother and helps her lose weight that was gained during pregnancy.   Breastfeeding makes the uterus contract back down to normal size faster and slows bleeding following delivery.   Breastfeeding mothers have a lower risk of developing breast cancer.  BREASTFEEDING FREQUENCY  A healthy, full-term baby may breastfeed as often as every hour or space his or her feedings to every 3 hours.   Watch your baby for signs of hunger. Nurse your baby if he or she shows signs of hunger. How often you nurse will vary from baby to baby.   Nurse as often as the baby requests, or when you feel the need to reduce the fullness of your breasts.   Awaken the baby if it has been 3 4 hours since  the last feeding.   Frequent feeding will help the mother make more milk and will help prevent problems, such as sore nipples and engorgement of the breasts.  BABY'S POSITION AT THE BREAST  Whether lying down or sitting, be sure that the baby's tummy is facing your tummy.   Support the breast with 4 fingers underneath the breast and the thumb above. Make sure your fingers are well away from the nipple and baby's mouth.   Stroke the baby's lips gently with your finger or nipple.   When the baby's mouth is open wide enough, place all of your nipple and as much of the areola as possible into your baby's mouth.   Pull the baby in close so the tip of the nose and the baby's cheeks touch the breast during the feeding.  FEEDINGS AND SUCTION  The length of each feeding varies from baby to baby and from feeding to feeding.   The baby must suck about 2 3 minutes for your milk to get to him or her. This is called a "let down." For this reason, allow the baby to feed on each breast as long as he or she wants. Your baby will end the feeding when he or she has received the right balance of nutrients.   To break the suction, put your finger into the corner of the baby's mouth and slide it between his or her gums before removing your breast from his or her mouth. This will help prevent sore nipples.  HOW TO TELL WHETHER YOUR BABY IS GETTING ENOUGH BREAST MILK. Wondering whether or not your baby is getting enough milk is a common concern among mothers. You can be assured that your baby is getting enough milk if:   Your baby is actively  sucking and you hear swallowing.   Your baby seems relaxed and satisfied after a feeding.   Your baby nurses at least 8 12 times in a 24 hour time period. Nurse your baby until he or she unlatches or falls asleep at the first breast (at least 10 20 minutes), then offer the second side.   Your baby is wetting 5 6 disposable diapers (6 8 cloth diapers) in a 24  hour period by 58 54 days of age.   Your baby is having at least 3 4 stools every 24 hours for the first 6 weeks. The stool should be soft and yellow.   Your baby should gain 4 7 ounces per week after he or she is 58 days old.   Your breasts feel softer after nursing.  REDUCING BREAST ENGORGEMENT  In the first week after your baby is born, you may experience signs of breast engorgement. When breasts are engorged, they feel heavy, warm, full, and may be tender to the touch. You can reduce engorgement if you:   Nurse frequently, every 2 3 hours. Mothers who breastfeed early and often have fewer problems with engorgement.   Place light ice packs on your breasts for 10 20 minutes between feedings. This reduces swelling. Wrap the ice packs in a lightweight towel to protect your skin. Bags of frozen vegetables work well for this purpose.   Take a warm shower or apply warm, moist heat to your breast for 5 10 minutes just before each feeding. This increases circulation and helps the milk flow.   Gently massage your breast before and during the feeding. Using your finger tips, massage from the chest wall towards your nipple in a circular motion.   Make sure that the baby empties at least one breast at every feeding before switching sides.   Use a breast pump to empty the breasts if your baby is sleepy or not nursing well. You may also want to pump if you are returning to work oryou feel you are getting engorged.   Avoid bottle feeds, pacifiers, or supplemental feedings of water or juice in place of breastfeeding. Breast milk is all the food your baby needs. It is not necessary for your baby to have water or formula. In fact, to help your breasts make more milk, it is best not to give your baby supplemental feedings during the early weeks.   Be sure the baby is latched on and positioned properly while breastfeeding.   Wear a supportive bra, avoiding underwire styles.   Eat a balanced  diet with enough fluids.   Rest often, relax, and take your prenatal vitamins to prevent fatigue, stress, and anemia.  If you follow these suggestions, your engorgement should improve in 24 48 hours. If you are still experiencing difficulty, call your lactation consultant or caregiver.  CARING FOR YOURSELF Take care of your breasts  Bathe or shower daily.   Avoid using soap on your nipples.   Start feedings on your left breast at one feeding and on your right breast at the next feeding.   You will notice an increase in your milk supply 2 5 days after delivery. You may feel some discomfort from engorgement, which makes your breasts very firm and often tender. Engorgement "peaks" out within 24 48 hours. In the meantime, apply warm moist towels to your breasts for 5 10 minutes before feeding. Gentle massage and expression of some milk before feeding will soften your breasts, making it easier for your  baby to latch on.   Wear a well-fitting nursing bra, and air dry your nipples for a 3 after each feeding.   Only use cotton bra pads.   Only use pure lanolin on your nipples after nursing. You do not need to wash it off before feeding the baby again. Another option is to express a few drops of breast milk and gently massage it into your nipples.  Take care of yourself  Eat well-balanced meals and nutritious snacks.   Drinking milk, fruit juice, and water to satisfy your thirst (about 8 glasses a day).   Get plenty of rest.  Avoid foods that you notice affect the baby in a bad way.  SEEK MEDICAL CARE IF:   You have difficulty with breastfeeding and need help.   You have a hard, red, sore area on your breast that is accompanied by a fever.   Your baby is too sleepy to eat well or is having trouble sleeping.   Your baby is wetting less than 6 diapers a day, by 13 days of age.   Your baby's skin or white part of his or her eyes is more yellow than it was in the  hospital.   You feel depressed.  Document Released: 09/21/2005 Document Revised: 03/22/2012 Document Reviewed: 12/20/2011 [facility] Patient Information 2013 [place], [place].

## 2012-12-16 ENCOUNTER — Other Ambulatory Visit: Payer: Self-pay

## 2012-12-16 ENCOUNTER — Ambulatory Visit (HOSPITAL_COMMUNITY)
Admission: RE | Admit: 2012-12-16 | Discharge: 2012-12-16 | Disposition: A | Discharge status: Home / Self Care | Payer: 59 | Source: Ambulatory Visit | Attending: Family Medicine | Admitting: Family Medicine

## 2012-12-16 ENCOUNTER — Encounter: Payer: Self-pay | Admitting: Family Medicine

## 2012-12-16 DIAGNOSIS — O351XX0 Maternal care for (suspected) chromosomal abnormality in fetus, not applicable or unspecified: Secondary | ICD-10-CM | POA: Insufficient documentation

## 2012-12-16 DIAGNOSIS — O09299 Supervision of pregnancy with other poor reproductive or obstetric history, unspecified trimester: Secondary | ICD-10-CM | POA: Insufficient documentation

## 2012-12-16 DIAGNOSIS — O2620 Pregnancy care for patient with recurrent pregnancy loss, unspecified trimester: Secondary | ICD-10-CM | POA: Insufficient documentation

## 2012-12-16 DIAGNOSIS — Z3481 Encounter for supervision of other normal pregnancy, first trimester: Secondary | ICD-10-CM

## 2012-12-16 DIAGNOSIS — Z3689 Encounter for other specified antenatal screening: Secondary | ICD-10-CM | POA: Insufficient documentation

## 2012-12-16 DIAGNOSIS — O3510X0 Maternal care for (suspected) chromosomal abnormality in fetus, unspecified, not applicable or unspecified: Secondary | ICD-10-CM | POA: Insufficient documentation

## 2012-12-16 NOTE — Progress Notes (Signed)
DANASHA MELMAN  was seen today for an ultrasound appointment.  See full report in [facility].  Impression: Single IUP at 12 4/7 weeks NT of 1.5 mm noted; nasal bone visualized First trimester aneuploidy screen performed as noted above.    Recommendations: Please do not draw triple/quad screen, though patient should be offered MSAFP for neural tube defect screening.  Recommend ultrasound for fetal anatomy at approximately [redacted] weeks gestation.  Alpha Gula, MD

## 2012-12-26 ENCOUNTER — Other Ambulatory Visit: Payer: Self-pay

## 2013-01-10 ENCOUNTER — Ambulatory Visit (INDEPENDENT_AMBULATORY_CARE_PROVIDER_SITE_OTHER): Payer: 59 | Admitting: Family Medicine

## 2013-01-10 VITALS — BP 115/82 | Wt 166.0 lb

## 2013-01-10 DIAGNOSIS — Z3480 Encounter for supervision of other normal pregnancy, unspecified trimester: Secondary | ICD-10-CM

## 2013-01-10 DIAGNOSIS — Z3482 Encounter for supervision of other normal pregnancy, second trimester: Secondary | ICD-10-CM

## 2013-01-10 NOTE — Progress Notes (Signed)
Stomach pain is improved.  Nml first trim. Screen.  AFP today.  Anatomy u/s scheduled.

## 2013-01-10 NOTE — Patient Instructions (Signed)
Pregnancy - Second Trimester The second trimester of pregnancy (3 to 6 months) is a period of rapid growth for you and your baby. At the end of the sixth month, your baby is about 9 inches long and weighs 1 1/2 pounds. You will begin to feel the baby move between 18 and 20 weeks of the pregnancy. This is called quickening. Weight gain is faster. A clear fluid (colostrum) may leak out of your breasts. You may feel small contractions of the womb (uterus). This is known as false labor or Braxton-Hicks contractions. This is like a practice for labor when the baby is ready to be born. Usually, the problems with morning sickness have usually passed by the end of your first trimester. Some women develop small dark blotches (called cholasma, mask of pregnancy) on their face that usually goes away after the baby is born. Exposure to the sun makes the blotches worse. Acne may also develop in some pregnant women and pregnant women who have acne, may find that it goes away. PRENATAL EXAMS  Blood work may continue to be done during prenatal exams. These tests are done to check on your health and the probable health of your baby. Blood work is used to follow your blood levels (hemoglobin). Anemia (low hemoglobin) is common during pregnancy. Iron and vitamins are given to help prevent this. You will also be checked for diabetes between 24 and 28 weeks of the pregnancy. Some of the previous blood tests may be repeated.  The size of the uterus is measured during each visit. This is to make sure that the baby is continuing to grow properly according to the dates of the pregnancy.  Your blood pressure is checked every prenatal visit. This is to make sure you are not getting toxemia.  Your urine is checked to make sure you do not have an infection, diabetes or protein in the urine.  Your weight is checked often to make sure gains are happening at the suggested rate. This is to ensure that both you and your baby are  growing normally.  Sometimes, an ultrasound is performed to confirm the proper growth and development of the baby. This is a test which bounces harmless sound waves off the baby so your caregiver can more accurately determine due dates. Sometimes, a specialized test is done on the amniotic fluid surrounding the baby. This test is called an amniocentesis. The amniotic fluid is obtained by sticking a needle into the belly (abdomen). This is done to check the chromosomes in instances where there is a concern about possible genetic problems with the baby. It is also sometimes done near the end of pregnancy if an early delivery is required. In this case, it is done to help make sure the baby's lungs are mature enough for the baby to live outside of the womb. CHANGES OCCURING IN THE SECOND TRIMESTER OF PREGNANCY Your body goes through many changes during pregnancy. They vary from person to person. Talk to your caregiver about changes you notice that you are concerned about.  During the second trimester, you will likely have an increase in your appetite. It is normal to have cravings for certain foods. This varies from person to person and pregnancy to pregnancy.  Your lower abdomen will begin to bulge.  You may have to urinate more often because the uterus and baby are pressing on your bladder. It is also common to get more bladder infections during pregnancy (pain with urination). You can help this by   drinking lots of fluids and emptying your bladder before and after intercourse.  You may begin to get stretch marks on your hips, abdomen, and breasts. These are normal changes in the body during pregnancy. There are no exercises or medications to take that prevent this change.  You may begin to develop swollen and bulging veins (varicose veins) in your legs. Wearing support hose, elevating your feet for 15 minutes, 3 to 4 times a day and limiting salt in your diet helps lessen the problem.  Heartburn may  develop as the uterus grows and pushes up against the stomach. Antacids recommended by your caregiver helps with this problem. Also, eating smaller meals 4 to 5 times a day helps.  Constipation can be treated with a stool softener or adding bulk to your diet. Drinking lots of fluids, vegetables, fruits, and whole grains are helpful.  Exercising is also helpful. If you have been very active up until your pregnancy, most of these activities can be continued during your pregnancy. If you have been less active, it is helpful to start an exercise program such as walking.  Hemorrhoids (varicose veins in the rectum) may develop at the end of the second trimester. Warm sitz baths and hemorrhoid cream recommended by your caregiver helps hemorrhoid problems.  Backaches may develop during this time of your pregnancy. Avoid heavy lifting, wear low heal shoes and practice good posture to help with backache problems.  Some pregnant women develop tingling and numbness of their hand and fingers because of swelling and tightening of ligaments in the wrist (carpel tunnel syndrome). This goes away after the baby is born.  As your breasts enlarge, you may have to get a bigger bra. Get a comfortable, cotton, support bra. Do not get a nursing bra until the last month of the pregnancy if you will be nursing the baby.  You may get a dark line from your belly button to the pubic area called the linea nigra.  You may develop rosy cheeks because of increase blood flow to the face.  You may develop spider looking lines of the face, neck, arms and chest. These go away after the baby is born. HOME CARE INSTRUCTIONS   It is extremely important to avoid all smoking, herbs, alcohol, and unprescribed drugs during your pregnancy. These chemicals affect the formation and growth of the baby. Avoid these chemicals throughout the pregnancy to ensure the delivery of a healthy infant.  Most of your home care instructions are the same  as suggested for the first trimester of your pregnancy. Keep your caregiver's appointments. Follow your caregiver's instructions regarding medication use, exercise and diet.  During pregnancy, you are providing food for you and your baby. Continue to eat regular, well-balanced meals. Choose foods such as meat, fish, milk and other low fat dairy products, vegetables, fruits, and whole-grain breads and cereals. Your caregiver will tell you of the ideal weight gain.  A physical sexual relationship may be continued up until near the end of pregnancy if there are no other problems. Problems could include early (premature) leaking of amniotic fluid from the membranes, vaginal bleeding, abdominal pain, or other medical or pregnancy problems.  Exercise regularly if there are no restrictions. Check with your caregiver if you are unsure of the safety of some of your exercises. The greatest weight gain will occur in the last 2 trimesters of pregnancy. Exercise will help you:  Control your weight.  Get you in shape for labor and delivery.  Lose weight   after you have the baby.  Wear a good support or jogging bra for breast tenderness during pregnancy. This may help if worn during sleep. Pads or tissues may be used in the bra if you are leaking colostrum.  Do not use hot tubs, steam rooms or saunas throughout the pregnancy.  Wear your seat belt at all times when driving. This protects you and your baby if you are in an accident.  Avoid raw meat, uncooked cheese, cat litter boxes and soil used by cats. These carry germs that can cause birth defects in the baby.  The second trimester is also a good time to visit your dentist for your dental health if this has not been done yet. Getting your teeth cleaned is OK. Use a soft toothbrush. Brush gently during pregnancy.  It is easier to loose urine during pregnancy. Tightening up and strengthening the pelvic muscles will help with this problem. Practice stopping  your urination while you are going to the bathroom. These are the same muscles you need to strengthen. It is also the muscles you would use as if you were trying to stop from passing gas. You can practice tightening these muscles up 10 times a set and repeating this about 3 times per day. Once you know what muscles to tighten up, do not perform these exercises during urination. It is more likely to contribute to an infection by backing up the urine.  Ask for help if you have financial, counseling or nutritional needs during pregnancy. Your caregiver will be able to offer counseling for these needs as well as refer you for other special needs.  Your skin may become oily. If so, wash your face with mild soap, use non-greasy moisturizer and oil or cream based makeup. MEDICATIONS AND DRUG USE IN PREGNANCY  Take prenatal vitamins as directed. The vitamin should contain 1 milligram of folic acid. Keep all vitamins out of reach of children. Only a couple vitamins or tablets containing iron may be fatal to a baby or young child when ingested.  Avoid use of all medications, including herbs, over-the-counter medications, not prescribed or suggested by your caregiver. Only take over-the-counter or prescription medicines for pain, discomfort, or fever as directed by your caregiver. Do not use aspirin.  Let your caregiver also know about herbs you may be using.  Alcohol is related to a number of birth defects. This includes fetal alcohol syndrome. All alcohol, in any form, should be avoided completely. Smoking will cause low birth rate and premature babies.  Street or illegal drugs are very harmful to the baby. They are absolutely forbidden. A baby born to an addicted mother will be addicted at birth. The baby will go through the same withdrawal an adult does. SEEK MEDICAL CARE IF:  You have any concerns or worries during your pregnancy. It is better to call with your questions if you feel they cannot wait,  rather than worry about them. SEEK IMMEDIATE MEDICAL CARE IF:   An unexplained oral temperature above 102 F (38.9 C) develops, or as your caregiver suggests.  You have leaking of fluid from the vagina (birth canal). If leaking membranes are suspected, take your temperature and tell your caregiver of this when you call.  There is vaginal spotting, bleeding, or passing clots. Tell your caregiver of the amount and how many pads are used. Light spotting in pregnancy is common, especially following intercourse.  You develop a bad smelling vaginal discharge with a change in the color from clear   to white.  You continue to feel sick to your stomach (nauseated) and have no relief from remedies suggested. You vomit blood or coffee ground-like materials.  You lose more than 2 pounds of weight or gain more than 2 pounds of weight over 1 week, or as suggested by your caregiver.  You notice swelling of your face, hands, feet, or legs.  You get exposed to German measles and have never had them.  You are exposed to fifth disease or chickenpox.  You develop belly (abdominal) pain. Round ligament discomfort is a common non-cancerous (benign) cause of abdominal pain in pregnancy. Your caregiver still must evaluate you.  You develop a bad headache that does not go away.  You develop fever, diarrhea, pain with urination, or shortness of breath.  You develop visual problems, blurry, or double vision.  You fall or are in a car accident or any kind of trauma.  There is mental or physical violence at home. Document Released: 09/15/2001 Document Revised: 12/14/2011 Document Reviewed: 03/20/2009 [employer] Patient Information 2013 [employer].  Breastfeeding Deciding to breastfeed is one of the best choices you can make for you and your baby. The information that follows gives a brief overview of the benefits of breastfeeding as well as common topics surrounding breastfeeding. BENEFITS OF  BREASTFEEDING For the baby  The first milk (colostrum) helps the baby's digestive system function better.   There are antibodies in the mother's milk that help the baby fight off infections.   The baby has a lower incidence of asthma, allergies, and sudden infant death syndrome (SIDS).   The nutrients in breast milk are better for the baby than infant formulas, and breast milk helps the baby's brain grow better.   Babies who breastfeed have less gas, colic, and constipation.  For the mother  Breastfeeding helps develop a very special bond between the mother and her baby.   Breastfeeding is convenient, always available at the correct temperature, and costs nothing.   Breastfeeding burns calories in the mother and helps her lose weight that was gained during pregnancy.   Breastfeeding makes the uterus contract back down to normal size faster and slows bleeding following delivery.   Breastfeeding mothers have a lower risk of developing breast cancer.  BREASTFEEDING FREQUENCY  A healthy, full-term baby may breastfeed as often as every hour or space his or her feedings to every 3 hours.   Watch your baby for signs of hunger. Nurse your baby if he or she shows signs of hunger. How often you nurse will vary from baby to baby.   Nurse as often as the baby requests, or when you feel the need to reduce the fullness of your breasts.   Awaken the baby if it has been 3 4 hours since the last feeding.   Frequent feeding will help the mother make more milk and will help prevent problems, such as sore nipples and engorgement of the breasts.  BABY'S POSITION AT THE BREAST  Whether lying down or sitting, be sure that the baby's tummy is facing your tummy.   Support the breast with 4 fingers underneath the breast and the thumb above. Make sure your fingers are well away from the nipple and baby's mouth.   Stroke the baby's lips gently with your finger or nipple.   When the  baby's mouth is open wide enough, place all of your nipple and as much of the areola as possible into your baby's mouth.   Pull the baby in   close so the tip of the nose and the baby's cheeks touch the breast during the feeding.  FEEDINGS AND SUCTION  The length of each feeding varies from baby to baby and from feeding to feeding.   The baby must suck about 2 3 minutes for your milk to get to him or her. This is called a "let down." For this reason, allow the baby to feed on each breast as long as he or she wants. Your baby will end the feeding when he or she has received the right balance of nutrients.   To break the suction, put your finger into the corner of the baby's mouth and slide it between his or her gums before removing your breast from his or her mouth. This will help prevent sore nipples.  HOW TO TELL WHETHER YOUR BABY IS GETTING ENOUGH BREAST MILK. Wondering whether or not your baby is getting enough milk is a common concern among mothers. You can be assured that your baby is getting enough milk if:   Your baby is actively sucking and you hear swallowing.   Your baby seems relaxed and satisfied after a feeding.   Your baby nurses at least 8 12 times in a 24 hour time period. Nurse your baby until he or she unlatches or falls asleep at the first breast (at least 10 20 minutes), then offer the second side.   Your baby is wetting 5 6 disposable diapers (6 8 cloth diapers) in a 24 hour period by 5 6 days of age.   Your baby is having at least 3 4 stools every 24 hours for the first 6 weeks. The stool should be soft and yellow.   Your baby should gain 4 7 ounces per week after he or she is 4 days old.   Your breasts feel softer after nursing.  REDUCING BREAST ENGORGEMENT  In the first week after your baby is born, you may experience signs of breast engorgement. When breasts are engorged, they feel heavy, warm, full, and may be tender to the touch. You can reduce  engorgement if you:   Nurse frequently, every 2 3 hours. Mothers who breastfeed early and often have fewer problems with engorgement.   Place light ice packs on your breasts for 10 20 minutes between feedings. This reduces swelling. Wrap the ice packs in a lightweight towel to protect your skin. Bags of frozen vegetables work well for this purpose.   Take a warm shower or apply warm, moist heat to your breast for 5 10 minutes just before each feeding. This increases circulation and helps the milk flow.   Gently massage your breast before and during the feeding. Using your finger tips, massage from the chest wall towards your nipple in a circular motion.   Make sure that the baby empties at least one breast at every feeding before switching sides.   Use a breast pump to empty the breasts if your baby is sleepy or not nursing well. You may also want to pump if you are returning to work oryou feel you are getting engorged.   Avoid bottle feeds, pacifiers, or supplemental feedings of water or juice in place of breastfeeding. Breast milk is all the food your baby needs. It is not necessary for your baby to have water or formula. In fact, to help your breasts make more milk, it is best not to give your baby supplemental feedings during the early weeks.   Be sure the baby is latched   on and positioned properly while breastfeeding.   Wear a supportive bra, avoiding underwire styles.   Eat a balanced diet with enough fluids.   Rest often, relax, and take your prenatal vitamins to prevent fatigue, stress, and anemia.  If you follow these suggestions, your engorgement should improve in 24 48 hours. If you are still experiencing difficulty, call your lactation consultant or caregiver.  CARING FOR YOURSELF Take care of your breasts  Bathe or shower daily.   Avoid using soap on your nipples.   Start feedings on your left breast at one feeding and on your right breast at the next  feeding.   You will notice an increase in your milk supply 2 5 days after delivery. You may feel some discomfort from engorgement, which makes your breasts very firm and often tender. Engorgement "peaks" out within 24 48 hours. In the meantime, apply warm moist towels to your breasts for 5 10 minutes before feeding. Gentle massage and expression of some milk before feeding will soften your breasts, making it easier for your baby to latch on.   Wear a well-fitting nursing bra, and air dry your nipples for a 3 4minutes after each feeding.   Only use cotton bra pads.   Only use pure lanolin on your nipples after nursing. You do not need to wash it off before feeding the baby again. Another option is to express a few drops of breast milk and gently massage it into your nipples.  Take care of yourself  Eat well-balanced meals and nutritious snacks.   Drinking milk, fruit juice, and water to satisfy your thirst (about 8 glasses a day).   Get plenty of rest.  Avoid foods that you notice affect the baby in a bad way.  SEEK MEDICAL CARE IF:   You have difficulty with breastfeeding and need help.   You have a hard, red, sore area on your breast that is accompanied by a fever.   Your baby is too sleepy to eat well or is having trouble sleeping.   Your baby is wetting less than 6 diapers a day, by 5 days of age.   Your baby's skin or white part of his or her eyes is more yellow than it was in the hospital.   You feel depressed.  Document Released: 09/21/2005 Document Revised: 03/22/2012 Document Reviewed: 12/20/2011 [employer] Patient Information 2013 [employer].  

## 2013-01-13 LAB — ALPHA FETOPROTEIN, MATERNAL
Curr Gest Age: 16 wks.days
Osb Risk: 1:14300 {titer}

## 2013-01-17 ENCOUNTER — Encounter: Payer: Self-pay | Admitting: Family Medicine

## 2013-01-27 ENCOUNTER — Ambulatory Visit (HOSPITAL_COMMUNITY)
Admission: RE | Admit: 2013-01-27 | Discharge: 2013-01-27 | Disposition: A | Discharge status: Home / Self Care | Payer: 59 | Source: Ambulatory Visit | Attending: Family Medicine | Admitting: Family Medicine

## 2013-01-27 ENCOUNTER — Encounter: Payer: Self-pay | Admitting: Family Medicine

## 2013-01-27 DIAGNOSIS — Z3482 Encounter for supervision of other normal pregnancy, second trimester: Secondary | ICD-10-CM

## 2013-01-27 DIAGNOSIS — Z3689 Encounter for other specified antenatal screening: Secondary | ICD-10-CM | POA: Insufficient documentation

## 2013-02-08 ENCOUNTER — Ambulatory Visit (INDEPENDENT_AMBULATORY_CARE_PROVIDER_SITE_OTHER): Payer: 59 | Admitting: Family Medicine

## 2013-02-08 ENCOUNTER — Encounter: Payer: Self-pay | Admitting: Family Medicine

## 2013-02-08 VITALS — BP 121/83 | Wt 171.0 lb

## 2013-02-08 DIAGNOSIS — Z3480 Encounter for supervision of other normal pregnancy, unspecified trimester: Secondary | ICD-10-CM

## 2013-02-08 DIAGNOSIS — Z3482 Encounter for supervision of other normal pregnancy, second trimester: Secondary | ICD-10-CM

## 2013-02-08 NOTE — Patient Instructions (Signed)
Pregnancy - Second Trimester The second trimester of pregnancy (3 to 6 months) is a period of rapid growth for you and your baby. At the end of the sixth month, your baby is about 9 inches long and weighs 1 1/2 pounds. You will begin to feel the baby move between 18 and 20 weeks of the pregnancy. This is called quickening. Weight gain is faster. A clear fluid (colostrum) may leak out of your breasts. You may feel small contractions of the womb (uterus). This is known as false labor or Braxton-Hicks contractions. This is like a practice for labor when the baby is ready to be born. Usually, the problems with morning sickness have usually passed by the end of your first trimester. Some women develop small dark blotches (called cholasma, mask of pregnancy) on their face that usually goes away after the baby is born. Exposure to the sun makes the blotches worse. Acne may also develop in some pregnant women and pregnant women who have acne, may find that it goes away. PRENATAL EXAMS  Blood work may continue to be done during prenatal exams. These tests are done to check on your health and the probable health of your baby. Blood work is used to follow your blood levels (hemoglobin). Anemia (low hemoglobin) is common during pregnancy. Iron and vitamins are given to help prevent this. You will also be checked for diabetes between 24 and 28 weeks of the pregnancy. Some of the previous blood tests may be repeated.  The size of the uterus is measured during each visit. This is to make sure that the baby is continuing to grow properly according to the dates of the pregnancy.  Your blood pressure is checked every prenatal visit. This is to make sure you are not getting toxemia.  Your urine is checked to make sure you do not have an infection, diabetes or protein in the urine.  Your weight is checked often to make sure gains are happening at the suggested rate. This is to ensure that both you and your baby are  growing normally.  Sometimes, an ultrasound is performed to confirm the proper growth and development of the baby. This is a test which bounces harmless sound waves off the baby so your caregiver can more accurately determine due dates. Sometimes, a specialized test is done on the amniotic fluid surrounding the baby. This test is called an amniocentesis. The amniotic fluid is obtained by sticking a needle into the belly (abdomen). This is done to check the chromosomes in instances where there is a concern about possible genetic problems with the baby. It is also sometimes done near the end of pregnancy if an early delivery is required. In this case, it is done to help make sure the baby's lungs are mature enough for the baby to live outside of the womb. CHANGES OCCURING IN THE SECOND TRIMESTER OF PREGNANCY Your body goes through many changes during pregnancy. They vary from person to person. Talk to your caregiver about changes you notice that you are concerned about.  During the second trimester, you will likely have an increase in your appetite. It is normal to have cravings for certain foods. This varies from person to person and pregnancy to pregnancy.  Your lower abdomen will begin to bulge.  You may have to urinate more often because the uterus and baby are pressing on your bladder. It is also common to get more bladder infections during pregnancy (pain with urination). You can help this by   drinking lots of fluids and emptying your bladder before and after intercourse.  You may begin to get stretch marks on your hips, abdomen, and breasts. These are normal changes in the body during pregnancy. There are no exercises or medications to take that prevent this change.  You may begin to develop swollen and bulging veins (varicose veins) in your legs. Wearing support hose, elevating your feet for 15 minutes, 3 to 4 times a day and limiting salt in your diet helps lessen the problem.  Heartburn may  develop as the uterus grows and pushes up against the stomach. Antacids recommended by your caregiver helps with this problem. Also, eating smaller meals 4 to 5 times a day helps.  Constipation can be treated with a stool softener or adding bulk to your diet. Drinking lots of fluids, vegetables, fruits, and whole grains are helpful.  Exercising is also helpful. If you have been very active up until your pregnancy, most of these activities can be continued during your pregnancy. If you have been less active, it is helpful to start an exercise program such as walking.  Hemorrhoids (varicose veins in the rectum) may develop at the end of the second trimester. Warm sitz baths and hemorrhoid cream recommended by your caregiver helps hemorrhoid problems.  Backaches may develop during this time of your pregnancy. Avoid heavy lifting, wear low heal shoes and practice good posture to help with backache problems.  Some pregnant women develop tingling and numbness of their hand and fingers because of swelling and tightening of ligaments in the wrist (carpel tunnel syndrome). This goes away after the baby is born.  As your breasts enlarge, you may have to get a bigger bra. Get a comfortable, cotton, support bra. Do not get a nursing bra until the last month of the pregnancy if you will be nursing the baby.  You may get a dark line from your belly button to the pubic area called the linea nigra.  You may develop rosy cheeks because of increase blood flow to the face.  You may develop spider looking lines of the face, neck, arms and chest. These go away after the baby is born. HOME CARE INSTRUCTIONS   It is extremely important to avoid all smoking, herbs, alcohol, and unprescribed drugs during your pregnancy. These chemicals affect the formation and growth of the baby. Avoid these chemicals throughout the pregnancy to ensure the delivery of a healthy infant.  Most of your home care instructions are the same  as suggested for the first trimester of your pregnancy. Keep your caregiver's appointments. Follow your caregiver's instructions regarding medication use, exercise and diet.  During pregnancy, you are providing food for you and your baby. Continue to eat regular, well-balanced meals. Choose foods such as meat, fish, milk and other low fat dairy products, vegetables, fruits, and whole-grain breads and cereals. Your caregiver will tell you of the ideal weight gain.  A physical sexual relationship may be continued up until near the end of pregnancy if there are no other problems. Problems could include early (premature) leaking of amniotic fluid from the membranes, vaginal bleeding, abdominal pain, or other medical or pregnancy problems.  Exercise regularly if there are no restrictions. Check with your caregiver if you are unsure of the safety of some of your exercises. The greatest weight gain will occur in the last 2 trimesters of pregnancy. Exercise will help you:  Control your weight.  Get you in shape for labor and delivery.  Lose weight   after you have the baby.  Wear a good support or jogging bra for breast tenderness during pregnancy. This may help if worn during sleep. Pads or tissues may be used in the bra if you are leaking colostrum.  Do not use hot tubs, steam rooms or saunas throughout the pregnancy.  Wear your seat belt at all times when driving. This protects you and your baby if you are in an accident.  Avoid raw meat, uncooked cheese, cat litter boxes and soil used by cats. These carry germs that can cause birth defects in the baby.  The second trimester is also a good time to visit your dentist for your dental health if this has not been done yet. Getting your teeth cleaned is OK. Use a soft toothbrush. Brush gently during pregnancy.  It is easier to loose urine during pregnancy. Tightening up and strengthening the pelvic muscles will help with this problem. Practice stopping  your urination while you are going to the bathroom. These are the same muscles you need to strengthen. It is also the muscles you would use as if you were trying to stop from passing gas. You can practice tightening these muscles up 10 times a set and repeating this about 3 times per day. Once you know what muscles to tighten up, do not perform these exercises during urination. It is more likely to contribute to an infection by backing up the urine.  Ask for help if you have financial, counseling or nutritional needs during pregnancy. Your caregiver will be able to offer counseling for these needs as well as refer you for other special needs.  Your skin may become oily. If so, wash your face with mild soap, use non-greasy moisturizer and oil or cream based makeup. MEDICATIONS AND DRUG USE IN PREGNANCY  Take prenatal vitamins as directed. The vitamin should contain 1 milligram of folic acid. Keep all vitamins out of reach of children. Only a couple vitamins or tablets containing iron may be fatal to a baby or young child when ingested.  Avoid use of all medications, including herbs, over-the-counter medications, not prescribed or suggested by your caregiver. Only take over-the-counter or prescription medicines for pain, discomfort, or fever as directed by your caregiver. Do not use aspirin.  Let your caregiver also know about herbs you may be using.  Alcohol is related to a number of birth defects. This includes fetal alcohol syndrome. All alcohol, in any form, should be avoided completely. Smoking will cause low birth rate and premature babies.  Street or illegal drugs are very harmful to the baby. They are absolutely forbidden. A baby born to an addicted mother will be addicted at birth. The baby will go through the same withdrawal an adult does. SEEK MEDICAL CARE IF:  You have any concerns or worries during your pregnancy. It is better to call with your questions if you feel they cannot wait,  rather than worry about them. SEEK IMMEDIATE MEDICAL CARE IF:   An unexplained oral temperature above 102 F (38.9 C) develops, or as your caregiver suggests.  You have leaking of fluid from the vagina (birth canal). If leaking membranes are suspected, take your temperature and tell your caregiver of this when you call.  There is vaginal spotting, bleeding, or passing clots. Tell your caregiver of the amount and how many pads are used. Light spotting in pregnancy is common, especially following intercourse.  You develop a bad smelling vaginal discharge with a change in the color from clear   to white.  You continue to feel sick to your stomach (nauseated) and have no relief from remedies suggested. You vomit blood or coffee ground-like materials.  You lose more than 2 pounds of weight or gain more than 2 pounds of weight over 1 week, or as suggested by your caregiver.  You notice swelling of your face, hands, feet, or legs.  You get exposed to German measles and have never had them.  You are exposed to fifth disease or chickenpox.  You develop belly (abdominal) pain. Round ligament discomfort is a common non-cancerous (benign) cause of abdominal pain in pregnancy. Your caregiver still must evaluate you.  You develop a bad headache that does not go away.  You develop fever, diarrhea, pain with urination, or shortness of breath.  You develop visual problems, blurry, or double vision.  You fall or are in a car accident or any kind of trauma.  There is mental or physical violence at home. Document Released: 09/15/2001 Document Revised: 12/14/2011 Document Reviewed: 03/20/2009 [employer] Patient Information 2013 [employer].  Breastfeeding Deciding to breastfeed is one of the best choices you can make for you and your baby. The information that follows gives a brief overview of the benefits of breastfeeding as well as common topics surrounding breastfeeding. BENEFITS OF  BREASTFEEDING For the baby  The first milk (colostrum) helps the baby's digestive system function better.   There are antibodies in the mother's milk that help the baby fight off infections.   The baby has a lower incidence of asthma, allergies, and sudden infant death syndrome (SIDS).   The nutrients in breast milk are better for the baby than infant formulas, and breast milk helps the baby's brain grow better.   Babies who breastfeed have less gas, colic, and constipation.  For the mother  Breastfeeding helps develop a very special bond between the mother and her baby.   Breastfeeding is convenient, always available at the correct temperature, and costs nothing.   Breastfeeding burns calories in the mother and helps her lose weight that was gained during pregnancy.   Breastfeeding makes the uterus contract back down to normal size faster and slows bleeding following delivery.   Breastfeeding mothers have a lower risk of developing breast cancer.  BREASTFEEDING FREQUENCY  A healthy, full-term baby may breastfeed as often as every hour or space his or her feedings to every 3 hours.   Watch your baby for signs of hunger. Nurse your baby if he or she shows signs of hunger. How often you nurse will vary from baby to baby.   Nurse as often as the baby requests, or when you feel the need to reduce the fullness of your breasts.   Awaken the baby if it has been 3 4 hours since the last feeding.   Frequent feeding will help the mother make more milk and will help prevent problems, such as sore nipples and engorgement of the breasts.  BABY'S POSITION AT THE BREAST  Whether lying down or sitting, be sure that the baby's tummy is facing your tummy.   Support the breast with 4 fingers underneath the breast and the thumb above. Make sure your fingers are well away from the nipple and baby's mouth.   Stroke the baby's lips gently with your finger or nipple.   When the  baby's mouth is open wide enough, place all of your nipple and as much of the areola as possible into your baby's mouth.   Pull the baby in   close so the tip of the nose and the baby's cheeks touch the breast during the feeding.  FEEDINGS AND SUCTION  The length of each feeding varies from baby to baby and from feeding to feeding.   The baby must suck about 2 3 minutes for your milk to get to him or her. This is called a "let down." For this reason, allow the baby to feed on each breast as long as he or she wants. Your baby will end the feeding when he or she has received the right balance of nutrients.   To break the suction, put your finger into the corner of the baby's mouth and slide it between his or her gums before removing your breast from his or her mouth. This will help prevent sore nipples.  HOW TO TELL WHETHER YOUR BABY IS GETTING ENOUGH BREAST MILK. Wondering whether or not your baby is getting enough milk is a common concern among mothers. You can be assured that your baby is getting enough milk if:   Your baby is actively sucking and you hear swallowing.   Your baby seems relaxed and satisfied after a feeding.   Your baby nurses at least 8 12 times in a 24 hour time period. Nurse your baby until he or she unlatches or falls asleep at the first breast (at least 10 20 minutes), then offer the second side.   Your baby is wetting 5 6 disposable diapers (6 8 cloth diapers) in a 24 hour period by 5 6 days of age.   Your baby is having at least 3 4 stools every 24 hours for the first 6 weeks. The stool should be soft and yellow.   Your baby should gain 4 7 ounces per week after he or she is 4 days old.   Your breasts feel softer after nursing.  REDUCING BREAST ENGORGEMENT  In the first week after your baby is born, you may experience signs of breast engorgement. When breasts are engorged, they feel heavy, warm, full, and may be tender to the touch. You can reduce  engorgement if you:   Nurse frequently, every 2 3 hours. Mothers who breastfeed early and often have fewer problems with engorgement.   Place light ice packs on your breasts for 10 20 minutes between feedings. This reduces swelling. Wrap the ice packs in a lightweight towel to protect your skin. Bags of frozen vegetables work well for this purpose.   Take a warm shower or apply warm, moist heat to your breast for 5 10 minutes just before each feeding. This increases circulation and helps the milk flow.   Gently massage your breast before and during the feeding. Using your finger tips, massage from the chest wall towards your nipple in a circular motion.   Make sure that the baby empties at least one breast at every feeding before switching sides.   Use a breast pump to empty the breasts if your baby is sleepy or not nursing well. You may also want to pump if you are returning to work oryou feel you are getting engorged.   Avoid bottle feeds, pacifiers, or supplemental feedings of water or juice in place of breastfeeding. Breast milk is all the food your baby needs. It is not necessary for your baby to have water or formula. In fact, to help your breasts make more milk, it is best not to give your baby supplemental feedings during the early weeks.   Be sure the baby is latched   on and positioned properly while breastfeeding.   Wear a supportive bra, avoiding underwire styles.   Eat a balanced diet with enough fluids.   Rest often, relax, and take your prenatal vitamins to prevent fatigue, stress, and anemia.  If you follow these suggestions, your engorgement should improve in 24 48 hours. If you are still experiencing difficulty, call your lactation consultant or caregiver.  CARING FOR YOURSELF Take care of your breasts  Bathe or shower daily.   Avoid using soap on your nipples.   Start feedings on your left breast at one feeding and on your right breast at the next  feeding.   You will notice an increase in your milk supply 2 5 days after delivery. You may feel some discomfort from engorgement, which makes your breasts very firm and often tender. Engorgement "peaks" out within 24 48 hours. In the meantime, apply warm moist towels to your breasts for 5 10 minutes before feeding. Gentle massage and expression of some milk before feeding will soften your breasts, making it easier for your baby to latch on.   Wear a well-fitting nursing bra, and air dry your nipples for a 3 4minutes after each feeding.   Only use cotton bra pads.   Only use pure lanolin on your nipples after nursing. You do not need to wash it off before feeding the baby again. Another option is to express a few drops of breast milk and gently massage it into your nipples.  Take care of yourself  Eat well-balanced meals and nutritious snacks.   Drinking milk, fruit juice, and water to satisfy your thirst (about 8 glasses a day).   Get plenty of rest.  Avoid foods that you notice affect the baby in a bad way.  SEEK MEDICAL CARE IF:   You have difficulty with breastfeeding and need help.   You have a hard, red, sore area on your breast that is accompanied by a fever.   Your baby is too sleepy to eat well or is having trouble sleeping.   Your baby is wetting less than 6 diapers a day, by 5 days of age.   Your baby's skin or white part of his or her eyes is more yellow than it was in the hospital.   You feel depressed.  Document Released: 09/21/2005 Document Revised: 03/22/2012 Document Reviewed: 12/20/2011 [employer] Patient Information 2013 [employer].  

## 2013-02-08 NOTE — Assessment & Plan Note (Signed)
Doing well 

## 2013-02-08 NOTE — Progress Notes (Signed)
Doing well.  Reports good movement--Nml anatomy scan.

## 2013-03-05 DIAGNOSIS — Z87440 Personal history of urinary (tract) infections: Secondary | ICD-10-CM

## 2013-03-05 DIAGNOSIS — O2300 Infections of kidney in pregnancy, unspecified trimester: Secondary | ICD-10-CM

## 2013-03-05 HISTORY — DX: Infections of kidney in pregnancy, unspecified trimester: O23.00

## 2013-03-05 HISTORY — DX: Personal history of urinary (tract) infections: Z87.440

## 2013-03-08 ENCOUNTER — Encounter: Payer: 59 | Admitting: Family Medicine

## 2013-03-14 ENCOUNTER — Ambulatory Visit (INDEPENDENT_AMBULATORY_CARE_PROVIDER_SITE_OTHER): Payer: 59 | Admitting: Family Medicine

## 2013-03-14 ENCOUNTER — Inpatient Hospital Stay (HOSPITAL_COMMUNITY)
Admission: EM | Admit: 2013-03-14 | Discharge: 2013-03-16 | DRG: 781 | Disposition: A | Discharge status: Home / Self Care | Payer: 59 | Attending: Obstetrics & Gynecology | Admitting: Obstetrics & Gynecology

## 2013-03-14 ENCOUNTER — Emergency Department (HOSPITAL_COMMUNITY): Payer: 59

## 2013-03-14 ENCOUNTER — Encounter (HOSPITAL_COMMUNITY): Payer: Self-pay | Admitting: Emergency Medicine

## 2013-03-14 VITALS — BP 120/86 | Temp 101.5°F | Wt 166.0 lb

## 2013-03-14 DIAGNOSIS — K8020 Calculus of gallbladder without cholecystitis without obstruction: Secondary | ICD-10-CM | POA: Diagnosis present

## 2013-03-14 DIAGNOSIS — R509 Fever, unspecified: Secondary | ICD-10-CM

## 2013-03-14 DIAGNOSIS — N1330 Unspecified hydronephrosis: Secondary | ICD-10-CM | POA: Diagnosis present

## 2013-03-14 DIAGNOSIS — Z3480 Encounter for supervision of other normal pregnancy, unspecified trimester: Secondary | ICD-10-CM

## 2013-03-14 DIAGNOSIS — A419 Sepsis, unspecified organism: Secondary | ICD-10-CM

## 2013-03-14 DIAGNOSIS — O368390 Maternal care for abnormalities of the fetal heart rate or rhythm, unspecified trimester, not applicable or unspecified: Secondary | ICD-10-CM | POA: Diagnosis present

## 2013-03-14 DIAGNOSIS — O2302 Infections of kidney in pregnancy, second trimester: Secondary | ICD-10-CM | POA: Diagnosis present

## 2013-03-14 DIAGNOSIS — O9989 Other specified diseases and conditions complicating pregnancy, childbirth and the puerperium: Secondary | ICD-10-CM | POA: Diagnosis present

## 2013-03-14 DIAGNOSIS — N10 Acute tubulo-interstitial nephritis: Secondary | ICD-10-CM | POA: Diagnosis present

## 2013-03-14 DIAGNOSIS — O2390 Unspecified genitourinary tract infection in pregnancy, unspecified trimester: Principal | ICD-10-CM | POA: Diagnosis present

## 2013-03-14 DIAGNOSIS — N12 Tubulo-interstitial nephritis, not specified as acute or chronic: Secondary | ICD-10-CM | POA: Diagnosis present

## 2013-03-14 DIAGNOSIS — Z3482 Encounter for supervision of other normal pregnancy, second trimester: Secondary | ICD-10-CM

## 2013-03-14 HISTORY — DX: Infections of kidney in pregnancy, unspecified trimester: O23.00

## 2013-03-14 LAB — COMPREHENSIVE METABOLIC PANEL
CO2: 24 mEq/L (ref 19–32)
Calcium: 9.7 mg/dL (ref 8.4–10.5)
Creatinine, Ser: 0.64 mg/dL (ref 0.50–1.10)
GFR calc Af Amer: >90 mL/min (ref >90)
GFR calc non Af Amer: >90 mL/min (ref >90)
Glucose, Bld: 91 mg/dL (ref 70–99)

## 2013-03-14 LAB — POCT URINALYSIS DIPSTICK
Nitrite, UA: NEGATIVE
pH, UA: 7

## 2013-03-14 LAB — URINALYSIS, ROUTINE W REFLEX MICROSCOPIC
Bilirubin Urine: NEGATIVE
Ketones, ur: NEGATIVE mg/dL
Nitrite: NEGATIVE
Protein, ur: NEGATIVE mg/dL
Specific Gravity, Urine: 1.012 (ref 1.005–1.030)
Urobilinogen, UA: 2 mg/dL — ABNORMAL HIGH (ref 0.0–1.0)

## 2013-03-14 LAB — CBC WITH DIFFERENTIAL/PLATELET
Eosinophils Absolute: 0 10*3/uL (ref 0.0–0.7)
Lymphocytes Relative: 8 % — ABNORMAL LOW (ref 12–46)
Lymphs Abs: 1.1 10*3/uL (ref 0.7–4.0)
MCH: 25.7 pg — ABNORMAL LOW (ref 26.0–34.0)
Neutrophils Relative %: 86 % — ABNORMAL HIGH (ref 43–77)
Platelets: 438 10*3/uL — ABNORMAL HIGH (ref 150–400)
RBC: 4.47 MIL/uL (ref 3.87–5.11)
WBC: 13.2 10*3/uL — ABNORMAL HIGH (ref 4.0–10.5)

## 2013-03-14 LAB — URINE MICROSCOPIC-ADD ON

## 2013-03-14 LAB — CG4 I-STAT (LACTIC ACID): Lactic Acid, Venous: 1.98 mmol/L (ref 0.5–2.2)

## 2013-03-14 MED ORDER — PROMETHAZINE HCL 25 MG/ML IJ SOLN
25.0000 mg | Freq: Four times a day (QID) | INTRAMUSCULAR | Status: DC | PRN
  Administered 2013-03-14 – 2013-03-15 (×2): 25 mg via INTRAVENOUS
  Filled 2013-03-14 (×2): qty 1

## 2013-03-14 MED ORDER — CIPROFLOXACIN HCL 500 MG PO TABS: 500.0000 mg | ORAL_TABLET | Freq: Two times a day (BID) | ORAL | Status: DC

## 2013-03-14 MED ORDER — DEXTROSE 5 % IV SOLN
1.0000 g | Freq: Two times a day (BID) | INTRAVENOUS | Status: DC
  Administered 2013-03-15 – 2013-03-16 (×3): 1 g via INTRAVENOUS
  Filled 2013-03-14 (×4): qty 10

## 2013-03-14 MED ORDER — LACTATED RINGERS IV BOLUS (SEPSIS)
1000.0000 mL | Freq: Once | INTRAVENOUS | Status: AC
  Administered 2013-03-14: 1000 mL via INTRAVENOUS

## 2013-03-14 MED ORDER — ZOLPIDEM TARTRATE 5 MG PO TABS: 5.0000 mg | ORAL_TABLET | Freq: Every evening | ORAL | Status: DC | PRN

## 2013-03-14 MED ORDER — SODIUM CHLORIDE 0.9 % IV BOLUS (SEPSIS)
1000.0000 mL | INTRAVENOUS | Status: AC
  Administered 2013-03-14: 1000 mL via INTRAVENOUS

## 2013-03-14 MED ORDER — HYDROMORPHONE HCL 2 MG PO TABS
2.0000 mg | ORAL_TABLET | ORAL | Status: DC | PRN
  Administered 2013-03-15: 2 mg via ORAL
  Filled 2013-03-14: qty 1

## 2013-03-14 MED ORDER — ACETAMINOPHEN 325 MG PO TABS: 650.0000 mg | ORAL_TABLET | ORAL | Status: DC | PRN

## 2013-03-14 MED ORDER — CALCIUM CARBONATE ANTACID 500 MG PO CHEW: 2.0000 | CHEWABLE_TABLET | ORAL | Status: DC | PRN

## 2013-03-14 MED ORDER — HYDROMORPHONE HCL PF 1 MG/ML IJ SOLN
1.0000 mg | INTRAMUSCULAR | Status: DC | PRN
  Administered 2013-03-14: 2 mg via INTRAVENOUS
  Administered 2013-03-14 – 2013-03-15 (×2): 1 mg via INTRAVENOUS
  Filled 2013-03-14: qty 2
  Filled 2013-03-14 (×2): qty 1
  Filled 2013-03-14: qty 2

## 2013-03-14 MED ORDER — HYDROCODONE-ACETAMINOPHEN 5-325 MG PO TABS: 2.0000 | ORAL_TABLET | ORAL | Status: DC | PRN

## 2013-03-14 MED ORDER — PRENATAL MULTIVITAMIN CH
1.0000 | ORAL_TABLET | Freq: Every day | ORAL | Status: DC
  Administered 2013-03-15 – 2013-03-16 (×2): 1 via ORAL
  Filled 2013-03-14 (×2): qty 1

## 2013-03-14 MED ORDER — DIPHENHYDRAMINE HCL 25 MG PO TABS: 25.0000 mg | ORAL_TABLET | Freq: Four times a day (QID) | ORAL | Status: DC | PRN

## 2013-03-14 MED ORDER — DEXTROSE 5 % IV SOLN
1.0000 g | Freq: Once | INTRAVENOUS | Status: AC
  Administered 2013-03-14: 1 g via INTRAVENOUS
  Filled 2013-03-14: qty 10

## 2013-03-14 MED ORDER — DOCUSATE SODIUM 100 MG PO CAPS
100.0000 mg | ORAL_CAPSULE | Freq: Every day | ORAL | Status: DC
  Administered 2013-03-14 – 2013-03-16 (×2): 100 mg via ORAL
  Filled 2013-03-14 (×3): qty 1

## 2013-03-14 MED ORDER — MORPHINE SULFATE 4 MG/ML IJ SOLN
2.0000 mg | Freq: Once | INTRAMUSCULAR | Status: AC
  Administered 2013-03-14: 2 mg via INTRAVENOUS
  Filled 2013-03-14: qty 1

## 2013-03-14 MED ORDER — ONDANSETRON HCL 4 MG PO TABS: 4.0000 mg | ORAL_TABLET | Freq: Four times a day (QID) | ORAL | Status: DC

## 2013-03-14 MED ORDER — CIPROFLOXACIN HCL 500 MG PO TABS: 500.0000 mg | ORAL_TABLET | Freq: Once | ORAL | Status: DC

## 2013-03-14 NOTE — Progress Notes (Signed)
Call from [facility] regarding 25wk pt with c/o abd pain, R/O gallbladder disease. OB RR RN in route.

## 2013-03-14 NOTE — Progress Notes (Signed)
P-[id number].  Pt has had fever x 4 days.  Pt complains of vomiting/diarrhea/constipation and chills x 1 week.

## 2013-03-14 NOTE — H&P (Signed)
Antenatal Admission History and Physical  History of Present Illness MICCA MATURA is a 22 y.o. [id number] at [redacted]w[redacted]d admitted for possible acute pyelonephritis.  She was evaluated in [facility] clinic and was noted to have a fever of 101.5 F and right sided pain in RUQ and right flank area. Given her history of cholelithiasis, there was concern about possible cholecystitis or appendicitis.  She was sent to [facility] ER for evaluation.  She underwent complete abdominal ultrasound that was negative for cholecystitis but showed moderate right hydronephrosis.  WBC was 13.6 with a left shift and her UA  Showed amber, cloudy urine with large LE but no nitrites and few bacteria was noted on microscopic analysis .  She was noted to have right flank pain on exam and the presumptive diagnosis was for pyelonephritis; urine culture pending,  Patient was started on Rocephin and sent to [facility] for admission, further evaluation and management.  Patient denies any vaginal bleeding, contractions, LOF and endorses good FM.  Fetus was tachycardic to the 170s in the [facility] ED secondary to the fever, no abdominal pain or other signs concerning for chorioamnionitis.     Patient Active Problem List   Diagnosis Date Noted  . Maternal fevers and leukocytosis, antepartum 03/14/2013  . Possible acute pyelonephritis in second trimester, antepartum 03/14/2013  . Supervision of other normal pregnancy 11/15/2012  . Hx of preeclampsia, prior pregnancy, currently pregnant 11/15/2012  . Cholelithiasis   . Rh negative state in antepartum period 11/01/2012    Past Medical History  Diagnosis Date  . Preeclampsia   . Pregnancy induced hypertension   . Cholelithiasis   . Pyelonephritis complicating pregnancy 03/2013    Past Surgical History  Procedure Laterality Date  . Tonsillectomy      2009    OB History   Grav Para Term Preterm Abortions TAB SAB Ect Mult Living   5 1 1  3  3   1       History   Social History  . Marital  Status: Married    Spouse Name: N/A    Number of Children: N/A  . Years of Education: N/A   Social History Main Topics  . Smoking status: Never Smoker   . Smokeless tobacco: Never Used  . Alcohol Use: No  . Drug Use: No  . Sexually Active: Yes -- Female partner(s)    Birth Control/ Protection: Condom   Other Topics Concern  . None   Social History Narrative  . None    Family History  Problem Relation Age of Onset  . Cancer Mother     cervial dysplasia  . Ovarian cysts Mother   . Cancer Maternal Aunt     cervical  . Cancer Maternal Grandmother 40    Cervical and Breast one breast  . Ovarian cysts Maternal Grandmother   . Diabetes Maternal Grandfather     Allergies  Allergen Reactions  . Codeine Hives  . Latex Hives    Prescriptions prior to admission  Medication Sig Dispense Refill  . loratadine (CLARITIN) 10 MG tablet Take 10 mg by mouth daily.      Marland Kitchen omeprazole (PRILOSEC) 20 MG capsule Take 20 mg by mouth daily.      . vitamin B-12 (CYANOCOBALAMIN) 100 MCG tablet Take 50 mcg by mouth daily.        Review of Systems - Negative except what is mentioned in HPI  Vitals:  BP 119/70  Pulse 127  Temp(Src) 99 F (37.2 C) (  Oral)  Resp 18  Ht 5\' 2"  (1.575 m)  Wt 166 lb (75.297 kg)  BMI 30.35 kg/m2  SpO2 97%  LMP 09/01/2012 Fetal Monitoring:Baseline: 170 bpm, Variability: moderate, Accelerations: Non-reactive but appropriate for gestational age and Decelerations: Absent Tocomotry: Flat, no contractions Physical Examination: General: No acute distress Heart: Tachycardic, regular rhythm Lungs: CTAB Abdomen: gravid, currently non-tender, no RUQ tenderness, and fundal height  is size equals dates Back: Right CVAT noted, no CVAT on left sid Pelvic Exam:examination not indicated Cervix: Not evaluated.  Extremities: extremities normal, atraumatic, no cyanosis or edema and Homans sign is negative, no sign of DVT with DTRs 2+ bilaterally  Labs:  Results for orders  placed during the hospital encounter of 03/14/13 (from the past 24 hour(s))  COMPREHENSIVE METABOLIC PANEL   Collection Time    03/14/13  3:45 PM      Result Value Range   Sodium 130 (*) 135 - 145 mEq/L   Potassium 3.5  3.5 - 5.1 mEq/L   Chloride 95 (*) 96 - 112 mEq/L   CO2 24  19 - 32 mEq/L   Glucose, Bld 91  70 - 99 mg/dL   BUN 5 (*) 6 - 23 mg/dL   Creatinine, Ser 4.13  0.50 - 1.10 mg/dL   Calcium 9.7  8.4 - 24.4 mg/dL   Total Protein 8.1  6.0 - 8.3 g/dL   Albumin 2.6 (*) 3.5 - 5.2 g/dL   AST 10  0 - 37 U/L   ALT 11  0 - 35 U/L   Alkaline Phosphatase 134 (*) 39 - 117 U/L   Total Bilirubin 0.4  0.3 - 1.2 mg/dL   GFR calc non Af Amer >90  >90 mL/min   GFR calc Af Amer >90  >90 mL/min  LIPASE, BLOOD   Collection Time    03/14/13  3:45 PM      Result Value Range   Lipase 31  11 - 59 U/L  CBC WITH DIFFERENTIAL   Collection Time    03/14/13  3:45 PM      Result Value Range   WBC 13.2 (*) 4.0 - 10.5 K/uL   RBC 4.47  3.87 - 5.11 MIL/uL   Hemoglobin 11.5 (*) 12.0 - 15.0 g/dL   HCT 01.0 (*) 27.2 - 53.6 %   MCV 79.2  78.0 - 100.0 fL   MCH 25.7 (*) 26.0 - 34.0 pg   MCHC 32.5  30.0 - 36.0 g/dL   RDW 64.4  03.4 - 74.2 %   Platelets 438 (*) 150 - 400 K/uL   Neutrophils Relative % 86 (*) 43 - 77 %   Neutro Abs 11.3 (*) 1.7 - 7.7 K/uL   Lymphocytes Relative 8 (*) 12 - 46 %   Lymphs Abs 1.1  0.7 - 4.0 K/uL   Monocytes Relative 6  3 - 12 %   Monocytes Absolute 0.8  0.1 - 1.0 K/uL   Eosinophils Relative 0  0 - 5 %   Eosinophils Absolute 0.0  0.0 - 0.7 K/uL   Basophils Relative 0  0 - 1 %   Basophils Absolute 0.0  0.0 - 0.1 K/uL  URINALYSIS, ROUTINE W REFLEX MICROSCOPIC   Collection Time    03/14/13  3:50 PM      Result Value Range   Color, Urine AMBER (*) YELLOW   APPearance CLOUDY (*) CLEAR   Specific Gravity, Urine 1.012  1.005 - 1.030   pH 6.5  5.0 - 8.0   Glucose,  UA NEGATIVE  NEGATIVE mg/dL   Hgb urine dipstick NEGATIVE  NEGATIVE   Bilirubin Urine NEGATIVE  NEGATIVE    Ketones, ur NEGATIVE  NEGATIVE mg/dL   Protein, ur NEGATIVE  NEGATIVE mg/dL   Urobilinogen, UA 2.0 (*) 0.0 - 1.0 mg/dL   Nitrite NEGATIVE  NEGATIVE   Leukocytes, UA LARGE (*) NEGATIVE  URINE MICROSCOPIC-ADD ON   Collection Time    03/14/13  3:50 PM      Result Value Range   Squamous Epithelial / LPF RARE  RARE   WBC, UA 21-50  <3 WBC/hpf   Bacteria, UA RARE  RARE  CG4 I-STAT (LACTIC ACID)   Collection Time    03/14/13  3:54 PM      Result Value Range   Lactic Acid, Venous 1.98  0.5 - 2.2 mmol/L  POCT URINALYSIS DIPSTICK   Collection Time    03/14/13  1:23 PM      Result Value Range   Color, UA amber     Clarity, UA clear     Glucose, UA neg     Bilirubin, UA small +1     Ketones, UA neg     Spec Grav, UA 1.010     Blood, UA trace     pH, UA 7.0     Protein, UA trace     Urobilinogen, UA 0.2     Nitrite, UA neg     Leukocytes, UA large (3+)      Imaging Studies: 03/14/2013  ABDOMINAL ULTRASOUND COMPLETE Clinical Data:  History of abdominal pain.  History of pregnancy.    Comparison:  None  Findings:  Gallbladder: No shadowing gallstones or echogenic sludge. No gallbladder wall thickening or pericholecystic fluid. The gallbladder wall thickness measured 2.3 mm. No sonographic Murphy's sign according to the ultrasound technologist.  CBD: Normal in caliber measuring 5 mm. No choledocholithiasis is evident.  Liver:  Normal size and echotexture without focal parenchymal abnormality.  IVC:  Patent throughout its visualized course in the abdomen.  Pancreas:  Although the pancreas is difficult to visualize in its entirety, no focal pancreatic abnormality is identified. The head of the pancreas was obscured by overlying bowel gas.  Spleen:  Normal size and echotexture without focal abnormality. Length is 7 cm.  Right kidney: There is moderate hydronephrosis.  .  Well-preserved cortex.  Normal parenchymal echotexture without focal abnormalities.  Right renal length is 13.2 cm.  Left kidney:   No hydronephrosis.  Well-preserved cortex.  Normal parenchymal echotexture without focal abnormalities.  Left renal length is 13.3 cm.  Aorta:  Maximum diameter is 1.7 cm.  No aneurysm is evident. The distal portion of the abdominal aorta and the bifurcation were obscured by overlying bowel gas.  Ascites:  None.  IMPRESSION: There is a moderate degree of right hydronephrosis.  No left hydronephrosis is evident. No other abnormalities are evident.   Original Report Authenticated By: Onalee Hua Call    ASSESSMENT: Patient Active Problem List   Diagnosis Date Noted  . Maternal fevers and leukocytosis, antepartum 03/14/2013  . Possible acute pyelonephritis in second trimester, antepartum 03/14/2013  . Supervision of other normal pregnancy 11/15/2012  . Hx of preeclampsia, prior pregnancy, currently pregnant 11/15/2012  . Cholelithiasis   . Rh negative state in antepartum period 11/01/2012  Will admit to Antenatal Unit Continue Rocephin 1g IV q12h, analgesia as needed for possible pyelonephritis Plan to observe until at least 48 hours afebrile, last fever was around 1300 on 03/14/13 Moderate hydronephrosis routinely  seen in pregnancy; if fevers persist, may need evaluation for nephrolithiasis or other further imaging for evaluation  Routine antenatal care.  [name], MD, FACOG Attending Obstetrician & Gynecologist Faculty Practice, [facility]

## 2013-03-14 NOTE — ED Provider Notes (Signed)
History     CSN: [id number]  Arrival date & time 03/14/13  1420   First MD Initiated Contact with Patient 03/14/13 1451      Chief Complaint  Patient presents with  . Abdominal Pain    right side    (Consider location/radiation/quality/duration/timing/severity/associated sxs/prior treatment) HPI  Patient is a 22 yo F G5P1 PMHx significant for preeclampsia, PIH, cholelithiasis presenting to the ED for constant sharp RUQ non-radiating pain that began a few days ago. Pt has had associated fevers, chills, nausea, diarrhea. Pt denies alleviating factors and states eating aggravates pain. Pt rates pain 10/10. Pt states she has had an incidence of this pain prior to today back in February when she was treated for "ulcers" at [facility], but the GI doctor is "unwilling to see her again while she is pregnant." Pt is currently [redacted] weeks pregnant with uncomplicated course so far being followed by Dr. Shawnie Pons. Denies CP.   Past Medical History  Diagnosis Date  . Preeclampsia   . Pregnancy induced hypertension   . Cholelithiasis   . Pyelonephritis complicating pregnancy 03/2013    Past Surgical History  Procedure Laterality Date  . Tonsillectomy      2009    Family History  Problem Relation Age of Onset  . Cancer Mother     cervial dysplasia  . Ovarian cysts Mother   . Cancer Maternal Aunt     cervical  . Cancer Maternal Grandmother 40    Cervical and Breast one breast  . Ovarian cysts Maternal Grandmother   . Diabetes Maternal Grandfather     History  Substance Use Topics  . Smoking status: Never Smoker   . Smokeless tobacco: Never Used  . Alcohol Use: No    OB History   Grav Para Term Preterm Abortions TAB SAB Ect Mult Living   5 1 1  3  3   1       Review of Systems  Constitutional: Positive for fever, chills and fatigue.  HENT: Negative for neck pain.   Eyes: Negative for visual disturbance.  Respiratory: Positive for shortness of breath.   Cardiovascular: Negative  for chest pain.  Gastrointestinal: Positive for nausea, abdominal pain and diarrhea.  Genitourinary: Positive for frequency. Negative for dysuria, flank pain, vaginal bleeding and vaginal discharge.  Musculoskeletal: Negative for back pain.  Skin: Negative.   Neurological: Negative for headaches.    Allergies  Codeine and Latex  Home Medications   No current outpatient prescriptions on file.  BP 115/63  Pulse 90  Temp(Src) 97.9 F (36.6 C) (Oral)  Resp 18  Ht 5\' 2"  (1.575 m)  Wt 172 lb (78.019 kg)  BMI 31.45 kg/m2  SpO2 97%  LMP 09/01/2012  Physical Exam  Constitutional: She is oriented to person, place, and time. She appears well-developed and well-nourished.  HENT:  Head: Normocephalic and atraumatic.  Mouth/Throat: Oropharynx is clear and moist.  Eyes: EOM are normal. Pupils are equal, round, and reactive to light.  Neck: Neck supple.  Cardiovascular: Normal rate, regular rhythm and normal heart sounds.   Pulmonary/Chest: Effort normal and breath sounds normal.  Abdominal: Soft. Bowel sounds are normal. There is tenderness in the right upper quadrant.  Pregnant abdomen.   Musculoskeletal: She exhibits no edema.  Neurological: She is alert and oriented to person, place, and time.  Skin: Skin is warm and dry.  Psychiatric: She has a normal mood and affect.    ED Course  Procedures (including critical care time)  Labs  Reviewed  URINALYSIS, ROUTINE W REFLEX MICROSCOPIC - Abnormal; Notable for the following:    Color, Urine AMBER (*)    APPearance CLOUDY (*)    Urobilinogen, UA 2.0 (*)    Leukocytes, UA LARGE (*)    All other components within normal limits  COMPREHENSIVE METABOLIC PANEL - Abnormal; Notable for the following:    Sodium 130 (*)    Chloride 95 (*)    BUN 5 (*)    Albumin 2.6 (*)    Alkaline Phosphatase 134 (*)    All other components within normal limits  CBC WITH DIFFERENTIAL - Abnormal; Notable for the following:    WBC 13.2 (*)     Hemoglobin 11.5 (*)    HCT 35.4 (*)    MCH 25.7 (*)    Platelets 438 (*)    Neutrophils Relative % 86 (*)    Neutro Abs 11.3 (*)    Lymphocytes Relative 8 (*)    All other components within normal limits  CBC WITH DIFFERENTIAL - Abnormal; Notable for the following:    WBC 14.9 (*)    Hemoglobin 10.0 (*)    HCT 30.7 (*)    MCH 25.8 (*)    Neutrophils Relative % 81 (*)    Neutro Abs 12.1 (*)    All other components within normal limits  URINE CULTURE  LIPASE, BLOOD  URINE MICROSCOPIC-ADD ON  CG4 I-STAT (LACTIC ACID)   US Abdomen Complete  03/14/2013   *RADIOLOGY REPORT*  Clinical Data:  History of abdominal pain.  History of pregnancy.  ABDOMINAL ULTRASOUND COMPLETE  Comparison:  None  Findings:  Gallbladder: No shadowing gallstones or echogenic sludge. No gallbladder wall thickening or pericholecystic fluid. The gallbladder wall thickness measured 2.3 mm. No sonographic Murphy's sign according to the ultrasound technologist.  CBD: Normal in caliber measuring 5 mm. No choledocholithiasis is evident.  Liver:  Normal size and echotexture without focal parenchymal abnormality.  IVC:  Patent throughout its visualized course in the abdomen.  Pancreas:  Although the pancreas is difficult to visualize in its entirety, no focal pancreatic abnormality is identified. The head of the pancreas was obscured by overlying bowel gas.  Spleen:  Normal size and echotexture without focal abnormality. Length is 7 cm.  Right kidney: There is moderate hydronephrosis.  .  Well-preserved cortex.  Normal parenchymal echotexture without focal abnormalities.  Right renal length is 13.2 cm.  Left kidney:  No hydronephrosis.  Well-preserved cortex.  Normal parenchymal echotexture without focal abnormalities.  Left renal length is 13.3 cm.  Aorta:  Maximum diameter is 1.7 cm.  No aneurysm is evident. The distal portion of the abdominal aorta and the bifurcation were obscured by overlying bowel gas.  Ascites:  None.   IMPRESSION: There is a moderate degree of right hydronephrosis.  No left hydronephrosis is evident. No other abnormalities are evident. .   Original Report Authenticated By: Onalee Hua Call     1. Supervision of other normal pregnancy, second trimester   2. Sepsis   3. Hydronephrosis, right   4. Pyelonephritis   5. Acute pyelonephritis in second trimester, antepartum   6. Cholelithiasis   7. Maternal pyrexia, antepartum       MDM  Pt with acute pyelonephrosis, cholelithiasis, and right sided hydronephrosis. OB rapid response cleared patient on evaluation. Labs, vitals, and imaging reviewed. Urology consulted. Pt will be transported to [facility] for admission and further management. Patient is reasonably stable at time of transport. The patient appears reasonably stabilized for  admission considering the current resources, flow, and capabilities available in the ED at this time, and I doubt any other [facility] requiring further screening and/or treatment in the ED prior to admission. Patient d/w with Dr. Bebe Shaggy, agrees with plan.               Lise Auer Arthella Headings, PA-C 03/16/13 0901

## 2013-03-14 NOTE — ED Notes (Signed)
Pt states that she does have a hx of ulcers and states that they are inflamed at the moment. States that whenever she eats the pain gets worse.

## 2013-03-14 NOTE — Progress Notes (Signed)
At pt bedside. Pt is G5P1 with history of PIH with last pregnancy. Pt states she has had positive fetal movement, no contractions, abd cramping, no vaginal bleeding or leakage of fluid. Pt c/o is right upper lateral quadrant pain radiating to the side. Pt states she has had a fever for 4 days as high as 101.5 today at the MD office. Pt states she was sent from OB/GYN office from Dr. Shawnie Pons to [facility] for evaluation.

## 2013-03-14 NOTE — Progress Notes (Signed)
Fever and chills.  Has known gallbladder, low grade temp.  Down 5 pounds.  Dirty urine.  To hospital for evaluation. Discussed with Dr. Macon Large and she advised pt. To go to [facility].  [facility] called and advised pt. Was on her way. 28 wk labs next visit + rhogam, TDaP

## 2013-03-14 NOTE — ED Provider Notes (Signed)
I spoke to dr Marcello Fennel about [place] finding and u/a Concern for pyelo vs obstructed ureteral stone causing hydro He will see patient in the ER Her OBGYN has been called IV fluids and antibiotics have been ordered   Joya Gaskins, MD 03/14/13 1827

## 2013-03-14 NOTE — Patient Instructions (Signed)
Pregnancy - Second Trimester The second trimester of pregnancy (3 to 6 months) is a period of rapid growth for you and your baby. At the end of the sixth month, your baby is about 9 inches long and weighs 1 1/2 pounds. You will begin to feel the baby move between 18 and 20 weeks of the pregnancy. This is called quickening. Weight gain is faster. A clear fluid (colostrum) may leak out of your breasts. You may feel small contractions of the womb (uterus). This is known as false labor or Braxton-Hicks contractions. This is like a practice for labor when the baby is ready to be born. Usually, the problems with morning sickness have usually passed by the end of your first trimester. Some women develop small dark blotches (called cholasma, mask of pregnancy) on their face that usually goes away after the baby is born. Exposure to the sun makes the blotches worse. Acne may also develop in some pregnant women and pregnant women who have acne, may find that it goes away. PRENATAL EXAMS  Blood work may continue to be done during prenatal exams. These tests are done to check on your health and the probable health of your baby. Blood work is used to follow your blood levels (hemoglobin). Anemia (low hemoglobin) is common during pregnancy. Iron and vitamins are given to help prevent this. You will also be checked for diabetes between 24 and 28 weeks of the pregnancy. Some of the previous blood tests may be repeated.  The size of the uterus is measured during each visit. This is to make sure that the baby is continuing to grow properly according to the dates of the pregnancy.  Your blood pressure is checked every prenatal visit. This is to make sure you are not getting toxemia.  Your urine is checked to make sure you do not have an infection, diabetes or protein in the urine.  Your weight is checked often to make sure gains are happening at the suggested rate. This is to ensure that both you and your baby are  growing normally.  Sometimes, an ultrasound is performed to confirm the proper growth and development of the baby. This is a test which bounces harmless sound waves off the baby so your caregiver can more accurately determine due dates. Sometimes, a test is done on the amniotic fluid surrounding the baby. This test is called an amniocentesis. The amniotic fluid is obtained by sticking a needle into the belly (abdomen). This is done to check the chromosomes in instances where there is a concern about possible genetic problems with the baby. It is also sometimes done near the end of pregnancy if an early delivery is required. In this case, it is done to help make sure the baby's lungs are mature enough for the baby to live outside of the womb. CHANGES OCCURING IN THE SECOND TRIMESTER OF PREGNANCY Your body goes through many changes during pregnancy. They vary from person to person. Talk to your caregiver about changes you notice that you are concerned about.  During the second trimester, you will likely have an increase in your appetite. It is normal to have cravings for certain foods. This varies from person to person and pregnancy to pregnancy.  Your lower abdomen will begin to bulge.  You may have to urinate more often because the uterus and baby are pressing on your bladder. It is also common to get more bladder infections during pregnancy. You can help this by drinking lots of fluids   and emptying your bladder before and after intercourse.  You may begin to get stretch marks on your hips, abdomen, and breasts. These are normal changes in the body during pregnancy. There are no exercises or medicines to take that prevent this change.  You may begin to develop swollen and bulging veins (varicose veins) in your legs. Wearing support hose, elevating your feet for 15 minutes, 3 to 4 times a day and limiting salt in your diet helps lessen the problem.  Heartburn may develop as the uterus grows and  pushes up against the stomach. Antacids recommended by your caregiver helps with this problem. Also, eating smaller meals 4 to 5 times a day helps.  Constipation can be treated with a stool softener or adding bulk to your diet. Drinking lots of fluids, and eating vegetables, fruits, and whole grains are helpful.  Exercising is also helpful. If you have been very active up until your pregnancy, most of these activities can be continued during your pregnancy. If you have been less active, it is helpful to start an exercise program such as walking.  Hemorrhoids may develop at the end of the second trimester. Warm sitz baths and hemorrhoid cream recommended by your caregiver helps hemorrhoid problems.  Backaches may develop during this time of your pregnancy. Avoid heavy lifting, wear low heal shoes, and practice good posture to help with backache problems.  Some pregnant women develop tingling and numbness of their hand and fingers because of swelling and tightening of ligaments in the wrist (carpel tunnel syndrome). This goes away after the baby is born.  As your breasts enlarge, you may have to get a bigger bra. Get a comfortable, cotton, support bra. Do not get a nursing bra until the last month of the pregnancy if you will be nursing the baby.  You may get a dark line from your belly button to the pubic area called the linea nigra.  You may develop rosy cheeks because of increase blood flow to the face.  You may develop spider looking lines of the face, neck, arms, and chest. These go away after the baby is born. HOME CARE INSTRUCTIONS   It is extremely important to avoid all smoking, herbs, alcohol, and unprescribed drugs during your pregnancy. These chemicals affect the formation and growth of the baby. Avoid these chemicals throughout the pregnancy to ensure the delivery of a healthy infant.  Most of your home care instructions are the same as suggested for the first trimester of your  pregnancy. Keep your caregiver's appointments. Follow your caregiver's instructions regarding medicine use, exercise, and diet.  During pregnancy, you are providing food for you and your baby. Continue to eat regular, well-balanced meals. Choose foods such as meat, fish, milk and other low fat dairy products, vegetables, fruits, and whole-grain breads and cereals. Your caregiver will tell you of the ideal weight gain.  A physical sexual relationship may be continued up until near the end of pregnancy if there are no other problems. Problems could include early (premature) leaking of amniotic fluid from the membranes, vaginal bleeding, abdominal pain, or other medical or pregnancy problems.  Exercise regularly if there are no restrictions. Check with your caregiver if you are unsure of the safety of some of your exercises. The greatest weight gain will occur in the last 2 trimesters of pregnancy. Exercise will help you:  Control your weight.  Get you in shape for labor and delivery.  Lose weight after you have the baby.  Wear   a good support or jogging bra for breast tenderness during pregnancy. This may help if worn during sleep. Pads or tissues may be used in the bra if you are leaking colostrum.  Do not use hot tubs, steam rooms or saunas throughout the pregnancy.  Wear your seat belt at all times when driving. This protects you and your baby if you are in an accident.  Avoid raw meat, uncooked cheese, cat litter boxes, and soil used by cats. These carry germs that can cause birth defects in the baby.  The second trimester is also a good time to visit your dentist for your dental health if this has not been done yet. Getting your teeth cleaned is okay. Use a soft toothbrush. Brush gently during pregnancy.  It is easier to leak urine during pregnancy. Tightening up and strengthening the pelvic muscles will help with this problem. Practice stopping your urination while you are going to the  bathroom. These are the same muscles you need to strengthen. It is also the muscles you would use as if you were trying to stop from passing gas. You can practice tightening these muscles up 10 times a set and repeating this about 3 times per day. Once you know what muscles to tighten up, do not perform these exercises during urination. It is more likely to contribute to an infection by backing up the urine.  Ask for help if you have financial, counseling, or nutritional needs during pregnancy. Your caregiver will be able to offer counseling for these needs as well as refer you for other special needs.  Your skin may become oily. If so, wash your face with mild soap, use non-greasy moisturizer and oil or cream based makeup. MEDICINES AND DRUG USE IN PREGNANCY  Take prenatal vitamins as directed. The vitamin should contain 1 milligram of folic acid. Keep all vitamins out of reach of children. Only a couple vitamins or tablets containing iron may be fatal to a baby or young child when ingested.  Avoid use of all medicines, including herbs, over-the-counter medicines, not prescribed or suggested by your caregiver. Only take over-the-counter or prescription medicines for pain, discomfort, or fever as directed by your caregiver. Do not use aspirin.  Let your caregiver also know about herbs you may be using.  Alcohol is related to a number of birth defects. This includes fetal alcohol syndrome. All alcohol, in any form, should be avoided completely. Smoking will cause low birth rate and premature babies.  Street or illegal drugs are very harmful to the baby. They are absolutely forbidden. A baby born to an addicted mother will be addicted at birth. The baby will go through the same withdrawal an adult does. SEEK MEDICAL CARE IF:  You have any concerns or worries during your pregnancy. It is better to call with your questions if you feel they cannot wait, rather than worry about them. SEEK IMMEDIATE  MEDICAL CARE IF:   An unexplained oral temperature above 102 F (38.9 C) develops, or as your caregiver suggests.  You have leaking of fluid from the vagina (birth canal). If leaking membranes are suspected, take your temperature and tell your caregiver of this when you call.  There is vaginal spotting, bleeding, or passing clots. Tell your caregiver of the amount and how many pads are used. Light spotting in pregnancy is common, especially following intercourse.  You develop a bad smelling vaginal discharge with a change in the color from clear to white.  You continue to feel   sick to your stomach (nauseated) and have no relief from remedies suggested. You vomit blood or coffee ground-like materials.  You lose more than 2 pounds of weight or gain more than 2 pounds of weight over 1 week, or as suggested by your caregiver.  You notice swelling of your face, hands, feet, or legs.  You get exposed to German measles and have never had them.  You are exposed to fifth disease or chickenpox.  You develop belly (abdominal) pain. Round ligament discomfort is a common non-cancerous (benign) cause of abdominal pain in pregnancy. Your caregiver still must evaluate you.  You develop a bad headache that does not go away.  You develop fever, diarrhea, pain with urination, or shortness of breath.  You develop visual problems, blurry, or double vision.  You fall or are in a car accident or any kind of trauma.  There is mental or physical violence at home. Document Released: 09/15/2001 Document Revised: 06/15/2012 Document Reviewed: 03/20/2009 [employer] Patient Information 2014 [employer].  Breastfeeding A change in hormones during your pregnancy causes growth of your breast tissue and an increase in number and size of milk ducts. The hormone prolactin allows proteins, sugars, and fats from your blood supply to make breast milk in your milk-producing glands. The hormone progesterone prevents  breast milk from being released before the birth of your baby. After the birth of your baby, your progesterone level decreases allowing breast milk to be released. Thoughts of your baby, as well as his or her sucking or crying, can stimulate the release of milk from the milk-producing glands. Deciding to breastfeed (nurse) is one of the best choices you can make for you and your baby. The information that follows gives a brief review of the benefits, as well as other important skills to know about breastfeeding. BENEFITS OF BREASTFEEDING For your baby  The first milk (colostrum) helps your baby's digestive system function better.   There are antibodies in your milk that help your baby fight off infections.   Your baby has a lower incidence of asthma, allergies, and sudden infant death syndrome (SIDS).   The nutrients in breast milk are better for your baby than infant formulas.  Breast milk improves your baby's brain development.   Your baby will have less gas, colic, and constipation.  Your baby is less likely to develop other conditions, such as childhood obesity, asthma, or diabetes mellitus. For you  Breastfeeding helps develop a very special bond between you and your baby.   Breastfeeding is convenient, always available at the correct temperature, and costs nothing.   Breastfeeding helps to burn calories and helps you lose the weight gained during pregnancy.   Breastfeeding makes your uterus contract back down to normal size faster and slows bleeding following delivery.   Breastfeeding mothers have a lower risk of developing osteoporosis or breast or ovarian cancer later in life.  BREASTFEEDING FREQUENCY  A healthy, full-term baby may breastfeed as often as every hour or space his or her feedings to every 3 hours. Breastfeeding frequency will vary from baby to baby.   Newborns should be fed no less than every 2 3 hours during the day and every 4 5 hours during the  night. You should breastfeed a minimum of 8 feedings in a 24 hour period.  Awaken your baby to breastfeed if it has been 3 4 hours since the last feeding.  Breastfeed when you feel the need to reduce the fullness of your breasts or when   your newborn shows signs of hunger. Signs that your baby may be hungry include:  Increased alertness or activity.  Stretching.  Movement of the head from side to side.  Movement of the head and opening of the mouth when the corner of the mouth or cheek is stroked (rooting).  Increased sucking sounds, smacking lips, cooing, sighing, or squeaking.  Hand-to-mouth movements.  Increased sucking of fingers or hands.  Fussing.  Intermittent crying.  Signs of extreme hunger will require calming and consoling before you try to feed your baby. Signs of extreme hunger may include:  Restlessness.  A loud, strong cry.  Screaming.  Frequent feeding will help you make more milk and will help prevent problems, such as sore nipples and engorgement of the breasts.  BREASTFEEDING   Whether lying down or sitting, be sure that the baby's abdomen is facing your abdomen.   Support your breast with 4 fingers under your breast and your thumb above your nipple. Make sure your fingers are well away from your nipple and your baby's mouth.   Stroke your baby's lips gently with your finger or nipple.   When your baby's mouth is open wide enough, place all of your nipple and as much of the colored area around your nipple (areola) as possible into your baby's mouth.  More areola should be visible above his or her upper lip than below his or her lower lip.  Your baby's tongue should be between his or her lower gum and your breast.  Ensure that your baby's mouth is correctly positioned around the nipple (latched). Your baby's lips should create a seal on your breast.  Signs that your baby has effectively latched onto your nipple include:  Tugging or sucking  without pain.  Swallowing heard between sucks.  Absent click or smacking sound.  Muscle movement above and in front of his or her ears with sucking.  Your baby must suck about 2 3 minutes in order to get your milk. Allow your baby to feed on each breast as long as he or she wants. Nurse your baby until he or she unlatches or falls asleep at the first breast, then offer the second breast.  Signs that your baby is full and satisfied include:  A gradual decrease in the number of sucks or complete cessation of sucking.  Falling asleep.  Extension or relaxation of his or her body.  Retention of a small amount of milk in his or her mouth.  Letting go of your breast by himself or herself.  Signs of effective breastfeeding in you include:  Breasts that have increased firmness, weight, and size prior to feeding.  Breasts that are softer after nursing.  Increased milk volume, as well as a change in milk consistency and color by the 5th day of breastfeeding.  Breast fullness relieved by breastfeeding.  Nipples are not sore, cracked, or bleeding.  If needed, break the suction by putting your finger into the corner of your baby's mouth and sliding your finger between his or her gums. Then, remove your breast from his or her mouth.  It is common for babies to spit up a small amount after a feeding.  Babies often swallow air during feeding. This can make babies fussy. Burping your baby between breasts can help with this.  Vitamin D supplements are recommended for babies who get only breast milk.  Avoid using a pacifier during your baby's first 4 6 weeks.  Avoid supplemental feedings of water, formula, or   juice in place of breastfeeding. Breast milk is all the food your baby needs. It is not necessary for your baby to have water or formula. Your breasts will make more milk if supplemental feedings are avoided during the early weeks. HOW TO TELL WHETHER YOUR BABY IS GETTING ENOUGH BREAST  MILK Wondering whether or not your baby is getting enough milk is a common concern among mothers. You can be assured that your baby is getting enough milk if:   Your baby is actively sucking and you hear swallowing.   Your baby seems relaxed and satisfied after a feeding.   Your baby nurses at least 8 12 times in a 24 hour time period.  During the first 3 5 days of age:  Your baby is wetting at least 3 5 diapers in a 24 hour period. The urine should be clear and pale yellow.  Your baby is having at least 3 4 stools in a 24 hour period. The stool should be soft and yellow.  At 5 7 days of age, your baby is having at least 3 6 stools in a 24 hour period. The stool should be seedy and yellow by 5 days of age.  Your baby has a weight loss less than 7 10% during the first 3 days of age.  Your baby does not lose weight after 3 7 days of age.  Your baby gains 4 7 ounces each week after he or she is 4 days of age.  Your baby gains weight by 5 days of age and is back to birth weight within 2 weeks. ENGORGEMENT In the first week after your baby is born, you may experience extremely full breasts (engorgement). When engorged, your breasts may feel heavy, warm, or tender to the touch. Engorgement peaks within 24 48 hours after delivery of your baby.  Engorgement may be reduced by:  Continuing to breastfeed.  Increasing the frequency of breastfeeding.  Taking warm showers or applying warm, moist heat to your breasts just before each feeding. This increases circulation and helps the milk flow.   Gently massaging your breast before and during the feedings. With your fingertips, massage from your chest wall towards your nipple in a circular motion.   Ensuring that your baby empties at least one breast at every feeding. It also helps to start the next feeding on the opposite breast.   Expressing breast milk by hand or by using a breast pump to empty the breasts if your baby is sleepy, or  not nursing well. You may also want to express milk if you are returning to work oryou feel you are getting engorged.  Ensuring your baby is latched on and positioned properly while breastfeeding. If you follow these suggestions, your engorgement should improve in 24 48 hours. If you are still experiencing difficulty, call your lactation consultant or caregiver.  CARING FOR YOURSELF Take care of your breasts.  Bathe or shower daily.   Avoid using soap on your nipples.   Wear a supportive bra. Avoid wearing underwire style bras.  Air dry your nipples for a 3 4minutes after each feeding.   Use only cotton bra pads to absorb breast milk leakage. Leaking of breast milk between feedings is normal.   Use only pure lanolin on your nipples after nursing. You do not need to wash it off before feeding your baby again. Another option is to express a few drops of breast milk and gently massage that milk into your nipples.  Continue   breast self-awareness checks. Take care of yourself.  Eat healthy foods. Alternate 3 meals with 3 snacks.  Avoid foods that you notice affect your baby in a bad way.  Drink milk, fruit juice, and water to satisfy your thirst (about 8 glasses a day).   Rest often, relax, and take your prenatal vitamins to prevent fatigue, stress, and anemia.  Avoid chewing and smoking tobacco.  Avoid alcohol and drug use.  Take over-the-counter and prescribed medicine only as directed by your caregiver or pharmacist. You should always check with your caregiver or pharmacist before taking any new medicine, vitamin, or herbal supplement.  Know that pregnancy is possible while breastfeeding. If desired, talk to your caregiver about family planning and safe birth control methods that may be used while breastfeeding. SEEK MEDICAL CARE IF:   You feel like you want to stop breastfeeding or have become frustrated with breastfeeding.  You have painful breasts or nipples.  Your  nipples are cracked or bleeding.  Your breasts are red, tender, or warm.  You have a swollen area on either breast.  You have a fever or chills.  You have nausea or vomiting.  You have drainage from your nipples.  Your breasts do not become full before feedings by the 5th day after delivery.  You feel sad and depressed.  Your baby is too sleepy to eat well.  Your baby is having trouble sleeping.   Your baby is wetting less than 3 diapers in a 24 hour period.  Your baby has less than 3 stools in a 24 hour period.  Your baby's skin or the white part of his or her eyes becomes more yellow.   Your baby is not gaining weight by 5 days of age. MAKE SURE YOU:   Understand these instructions.  Will watch your condition.  Will get help right away if you are not doing well or get worse. Document Released: 09/21/2005 Document Revised: 06/15/2012 Document Reviewed: 04/27/2012 [employer] Patient Information 2014 [employer].  

## 2013-03-14 NOTE — Assessment & Plan Note (Signed)
Not feeling well, fever here--sent for eval.

## 2013-03-14 NOTE — Progress Notes (Signed)
Dr. Macon Large notified of fetal tachycardia, maternal tachycardia, and fever. Notified c/o of abd pain. Stated that patient did not need EFM, but needed care that [facility] could provide. Dr. Macon Large cleared pt OB related.

## 2013-03-14 NOTE — ED Provider Notes (Signed)
Patient seen/examined in the Emergency Department in conjunction with Midlevel Provider [facility] Patient reports abdominal pain - RUQ Exam : tenderness in RUQ Plan: labs and US imaging Pt is pregnant at [redacted] weeks but she has been seen by OB nurse and no acute OB issues at this time Pt denies lower abd pain/cramping and no vag bleeding   Joya Gaskins, MD 03/14/13 1608

## 2013-03-14 NOTE — ED Provider Notes (Signed)
I spoke to dr Gordy Savers with OBGYN Will transfer to [facility] Patient has been given IV fluids, IV rocephin and IV pain meds She has been stabilized in the ED   Medications  lactated ringers bolus 1,000 mL (0 mLs Intravenous Stopped 03/14/13 1645)  morphine 4 MG/ML injection 2 mg (2 mg Intravenous Given 03/14/13 1647)  cefTRIAXone (ROCEPHIN) 1 g in dextrose 5 % 50 mL IVPB (0 g Intravenous Stopped 03/14/13 1826)  sodium chloride 0.9 % bolus 1,000 mL (1,000 mLs Intravenous New Bag/Given 03/14/13 1826)   CRITICAL CARE Performed by: Joya Gaskins Total critical care time: 31 Critical care time was exclusive of separately billable procedures and treating other patients. Critical care was necessary to treat or prevent imminent or life-threatening deterioration. Critical care was time spent personally by me on the following activities: development of treatment plan with patient and/or surrogate as well as nursing, discussions with consultants, evaluation of patient's response to treatment, examination of patient, obtaining history from patient or surrogate, ordering and performing treatments and interventions, ordering and review of laboratory studies, ordering and review of radiographic studies, pulse oximetry and re-evaluation of patient's condition.   Joya Gaskins, MD 03/14/13 [phone]

## 2013-03-14 NOTE — Progress Notes (Signed)
Bernita Buffy, CNM notified of FHR tachycardia, order for IVF bolus and EFM.

## 2013-03-14 NOTE — ED Notes (Signed)
OB rapid response nurse called and will come to assess pt

## 2013-03-14 NOTE — Progress Notes (Signed)
   CARE MANAGEMENT ED NOTE 03/14/2013  Patient:  Angel Pratt, Angel Pratt   Account Number:  [id number]  Date Initiated:  03/14/2013  Documentation initiated by:  Radford Pax  Subjective/Objective Assessment:   Patient presented to ED with fever for four days nausea, vomitting an diarrhea.     Subjective/Objective Assessment Detail:     Action/Plan:   Action/Plan Detail:   Anticipated DC Date:       Status Recommendation to Physician:   Result of Recommendation:    Other ED Services  Consult Working Plan    DC Planning Services  Other  PCP issues    Choice offered to / List presented to:            Status of service:  Completed, signed off  ED Comments:   ED Comments Detail:  Patient listed as not having a PCP.  EDCM spoke to patient who stated that her primary doctor was Dr. Loma Sender of [facility].  She also stated that her OBGYN was Dr. Tinnie Gens of [facility]. Offered support to patient.  No further needs at this time.

## 2013-03-15 LAB — CBC WITH DIFFERENTIAL/PLATELET
Basophils Absolute: 0 10*3/uL (ref 0.0–0.1)
Basophils Relative: 0 % (ref 0–1)
Eosinophils Absolute: 0 10*3/uL (ref 0.0–0.7)
Eosinophils Relative: 0 % (ref 0–5)
MCH: 25.8 pg — ABNORMAL LOW (ref 26.0–34.0)
MCV: 79.3 fL (ref 78.0–100.0)
Platelets: 323 10*3/uL (ref 150–400)
RDW: 13.8 % (ref 11.5–15.5)

## 2013-03-15 MED ORDER — SODIUM CHLORIDE 0.9 % IV SOLN
INTRAVENOUS | Status: DC
  Administered 2013-03-15 – 2013-03-16 (×3): via INTRAVENOUS

## 2013-03-15 NOTE — Progress Notes (Signed)
Patient ID: Angel Pratt, female   DOB: [birth year], 22 y.o.   MRN: [id number]  FACULTY PRACTICE ANTEPARTUM COMPREHENSIVE PROGRESS NOTE  OMEKA HOLBEN is a 22 y.o. [id number] at 102w0d  who is admitted for Pyelonephritis.  Estimated Date of Delivery: 06/28/13 Fetal presentation is unsure.  Length of Stay:  1 Days. 03/14/2013  Subjective: Right side pain, improved with dilaudid. Nausea and emesis last night but feeling hungry today. Patient reports good fetal movement.  She reports no uterine contractions, no bleeding and no loss of fluid per vagina.  Vitals:  Blood pressure 109/70, pulse 98, temperature 97.9 F (36.6 C), temperature source Oral, resp. rate 18, height 5\' 2"  (1.575 m), weight 75.297 kg (166 lb), last menstrual period 09/01/2012, SpO2 97.00%.   Filed Vitals:   03/14/13 1842 03/14/13 1846 03/14/13 2015 03/15/13 0003  BP:  124/68 119/70 109/70  Pulse:  124 127 98  Temp: 100 F (37.8 C) 100 F (37.8 C) 99 F (37.2 C) 97.9 F (36.6 C)  TempSrc:   Oral Oral  Resp:  18 18 18   Height:   5\' 2"  (1.575 m)   Weight:   75.297 kg (166 lb)   SpO2:         Physical Examination: GEN:  WNWD, no distress HEENT:  NCAT, EOMI, conjunctiva clear CV: RRR, no murmur RESP:  CTAB ABD:  R CVA and upper quadrant tenderness, no guarding or rebound, normal bowel sounds, gravid EXTREM:  Warm, well perfused, no edema or tenderness NEURO:  Alert, oriented, no focal deficits GU:  Deferred   Fetal Monitoring:  170s-->145 after fluids/rocephin/pain meds, var 1-6 bpm, no accels, no decels  Labs:  Results for orders placed during the hospital encounter of 03/14/13 (from the past 24 hour(s))  COMPREHENSIVE METABOLIC PANEL   Collection Time    03/14/13  3:45 PM      Result Value Range   Sodium 130 (*) 135 - 145 mEq/L   Potassium 3.5  3.5 - 5.1 mEq/L   Chloride 95 (*) 96 - 112 mEq/L   CO2 24  19 - 32 mEq/L   Glucose, Bld 91  70 - 99 mg/dL   BUN 5 (*) 6 - 23 mg/dL   Creatinine, Ser 6.96   0.50 - 1.10 mg/dL   Calcium 9.7  8.4 - 29.5 mg/dL   Total Protein 8.1  6.0 - 8.3 g/dL   Albumin 2.6 (*) 3.5 - 5.2 g/dL   AST 10  0 - 37 U/L   ALT 11  0 - 35 U/L   Alkaline Phosphatase 134 (*) 39 - 117 U/L   Total Bilirubin 0.4  0.3 - 1.2 mg/dL   GFR calc non Af Amer >90  >90 mL/min   GFR calc Af Amer >90  >90 mL/min  LIPASE, BLOOD   Collection Time    03/14/13  3:45 PM      Result Value Range   Lipase 31  11 - 59 U/L  CBC WITH DIFFERENTIAL   Collection Time    03/14/13  3:45 PM      Result Value Range   WBC 13.2 (*) 4.0 - 10.5 K/uL   RBC 4.47  3.87 - 5.11 MIL/uL   Hemoglobin 11.5 (*) 12.0 - 15.0 g/dL   HCT 28.4 (*) 13.2 - 44.0 %   MCV 79.2  78.0 - 100.0 fL   MCH 25.7 (*) 26.0 - 34.0 pg   MCHC 32.5  30.0 - 36.0 g/dL   RDW 13.6  11.5 - 15.5 %   Platelets 438 (*) 150 - 400 K/uL   Neutrophils Relative % 86 (*) 43 - 77 %   Neutro Abs 11.3 (*) 1.7 - 7.7 K/uL   Lymphocytes Relative 8 (*) 12 - 46 %   Lymphs Abs 1.1  0.7 - 4.0 K/uL   Monocytes Relative 6  3 - 12 %   Monocytes Absolute 0.8  0.1 - 1.0 K/uL   Eosinophils Relative 0  0 - 5 %   Eosinophils Absolute 0.0  0.0 - 0.7 K/uL   Basophils Relative 0  0 - 1 %   Basophils Absolute 0.0  0.0 - 0.1 K/uL  URINALYSIS, ROUTINE W REFLEX MICROSCOPIC   Collection Time    03/14/13  3:50 PM      Result Value Range   Color, Urine AMBER (*) YELLOW   APPearance CLOUDY (*) CLEAR   Specific Gravity, Urine 1.012  1.005 - 1.030   pH 6.5  5.0 - 8.0   Glucose, UA NEGATIVE  NEGATIVE mg/dL   Hgb urine dipstick NEGATIVE  NEGATIVE   Bilirubin Urine NEGATIVE  NEGATIVE   Ketones, ur NEGATIVE  NEGATIVE mg/dL   Protein, ur NEGATIVE  NEGATIVE mg/dL   Urobilinogen, UA 2.0 (*) 0.0 - 1.0 mg/dL   Nitrite NEGATIVE  NEGATIVE   Leukocytes, UA LARGE (*) NEGATIVE  URINE MICROSCOPIC-ADD ON   Collection Time    03/14/13  3:50 PM      Result Value Range   Squamous Epithelial / LPF RARE  RARE   WBC, UA 21-50  <3 WBC/hpf   Bacteria, UA RARE  RARE  CG4  I-STAT (LACTIC ACID)   Collection Time    03/14/13  3:54 PM      Result Value Range   Lactic Acid, Venous 1.98  0.5 - 2.2 mmol/L  CBC WITH DIFFERENTIAL   Collection Time    03/15/13  5:15 AM      Result Value Range   WBC 14.9 (*) 4.0 - 10.5 K/uL   RBC 3.87  3.87 - 5.11 MIL/uL   Hemoglobin 10.0 (*) 12.0 - 15.0 g/dL   HCT 19.1 (*) 47.8 - 29.5 %   MCV 79.3  78.0 - 100.0 fL   MCH 25.8 (*) 26.0 - 34.0 pg   MCHC 32.6  30.0 - 36.0 g/dL   RDW 62.1  30.8 - 65.7 %   Platelets 323  150 - 400 K/uL   Neutrophils Relative % 81 (*) 43 - 77 %   Neutro Abs 12.1 (*) 1.7 - 7.7 K/uL   Lymphocytes Relative 13  12 - 46 %   Lymphs Abs 1.9  0.7 - 4.0 K/uL   Monocytes Relative 6  3 - 12 %   Monocytes Absolute 0.9  0.1 - 1.0 K/uL   Eosinophils Relative 0  0 - 5 %   Eosinophils Absolute 0.0  0.0 - 0.7 K/uL   Basophils Relative 0  0 - 1 %   Basophils Absolute 0.0  0.0 - 0.1 K/uL  POCT URINALYSIS DIPSTICK   Collection Time    03/14/13  1:23 PM      Result Value Range   Color, UA amber     Clarity, UA clear     Glucose, UA neg     Bilirubin, UA small +1     Ketones, UA neg     Spec Grav, UA 1.010     Blood, UA trace     pH, UA 7.0  Protein, UA trace     Urobilinogen, UA 0.2     Nitrite, UA neg     Leukocytes, UA large (3+)      Imaging Studies:    No new  Medications:  Scheduled . cefTRIAXone (ROCEPHIN)  IV  1 g Intravenous Q12H  . docusate sodium  100 mg Oral Daily  . prenatal multivitamin  1 tablet Oral Q1200   I have reviewed the patient's current medications.  ASSESSMENT: Patient Active Problem List   Diagnosis Date Noted  . Maternal fevers and leukocytosis, antepartum 03/14/2013  . Possible acute pyelonephritis in second trimester, antepartum 03/14/2013  . Supervision of other normal pregnancy 11/15/2012  . Hx of preeclampsia, prior pregnancy, currently pregnant 11/15/2012  . Cholelithiasis   . Rh negative state in antepartum period 11/01/2012   -- Presumed  pyelonephritis  PLAN: IUP:  Fetal tachycardia resolved last night. q shift monitoring Continue rocephin, pain and nausea control Last fever 17:29 last night - afebrile for 12 hours Continue routine antenatal care.  Napoleon Form, MD

## 2013-03-15 NOTE — Progress Notes (Signed)
UR completed 

## 2013-03-16 DIAGNOSIS — N1330 Unspecified hydronephrosis: Secondary | ICD-10-CM | POA: Diagnosis present

## 2013-03-16 DIAGNOSIS — N12 Tubulo-interstitial nephritis, not specified as acute or chronic: Secondary | ICD-10-CM

## 2013-03-16 DIAGNOSIS — O2390 Unspecified genitourinary tract infection in pregnancy, unspecified trimester: Principal | ICD-10-CM

## 2013-03-16 DIAGNOSIS — N10 Acute tubulo-interstitial nephritis: Secondary | ICD-10-CM

## 2013-03-16 LAB — URINE CULTURE

## 2013-03-16 MED ORDER — CEPHALEXIN 500 MG PO CAPS: 500.0000 mg | ORAL_CAPSULE | Freq: Four times a day (QID) | ORAL | Status: DC

## 2013-03-16 MED ORDER — HYDROCODONE-ACETAMINOPHEN 5-325 MG PO TABS: 1.0000 | ORAL_TABLET | Freq: Four times a day (QID) | ORAL | Status: DC | PRN

## 2013-03-16 MED ORDER — OXYCODONE-ACETAMINOPHEN 5-325 MG PO TABS: 1.0000 | ORAL_TABLET | Freq: Four times a day (QID) | ORAL | Status: DC | PRN

## 2013-03-16 NOTE — ED Provider Notes (Signed)
Medical screening examination/treatment/procedure(s) were conducted as a shared visit with non-physician practitioner(s) and myself.  I personally evaluated the patient during the encounter  Pt checked on frequently, given IV fluids and rocephin, stable for transfer   Joya Gaskins, MD 03/16/13 1022

## 2013-03-16 NOTE — Discharge Summary (Signed)
Physician Discharge Summary  Patient ID: Angel Pratt MRN: [id number] DOB/AGE: [birth year] 22 y.o.  Admit date: 03/14/2013 Discharge date: 03/16/2013   Discharge Diagnoses:  Principal Problem:   Pyelonephritis Active Problems:   Cholelithiasis   Maternal fevers and leukocytosis, antepartum   Possible acute pyelonephritis in second trimester, antepartum   Hydronephrosis, right   Consults: None  Significant Diagnostic Studies: labs: elevated WBC, Large LE on UA, > 100,000K cfu of gram negative rods and radiology: Ultrasound: see below  US Abdomen Complete  03/14/2013   *RADIOLOGY REPORT*  Clinical Data:  History of abdominal pain.  History of pregnancy.  ABDOMINAL ULTRASOUND COMPLETE  Comparison:  None  Findings:  Gallbladder: No shadowing gallstones or echogenic sludge. No gallbladder wall thickening or pericholecystic fluid. The gallbladder wall thickness measured 2.3 mm. No sonographic Murphy's sign according to the ultrasound technologist.  CBD: Normal in caliber measuring 5 mm. No choledocholithiasis is evident.  Liver:  Normal size and echotexture without focal parenchymal abnormality.  IVC:  Patent throughout its visualized course in the abdomen.  Pancreas:  Although the pancreas is difficult to visualize in its entirety, no focal pancreatic abnormality is identified. The head of the pancreas was obscured by overlying bowel gas.  Spleen:  Normal size and echotexture without focal abnormality. Length is 7 cm.  Right kidney: There is moderate hydronephrosis.  .  Well-preserved cortex.  Normal parenchymal echotexture without focal abnormalities.  Right renal length is 13.2 cm.  Left kidney:  No hydronephrosis.  Well-preserved cortex.  Normal parenchymal echotexture without focal abnormalities.  Left renal length is 13.3 cm.  Aorta:  Maximum diameter is 1.7 cm.  No aneurysm is evident. The distal portion of the abdominal aorta and the bifurcation were obscured by overlying bowel gas.   Ascites:  None.  IMPRESSION: There is a moderate degree of right hydronephrosis.  No left hydronephrosis is evident. No other abnormalities are evident. .   Original Report Authenticated By: [facility] Course: Admitted after several day-one week h/o malaise, N/V, fever to 101.5 in the office, chills. Found to have pyelonephritis and treated.  Completed 48 hours of IV therapy and remained afebrile during that time.  Felt much better and was stable for discharge.   Disposition: Discharged home  Discharged Condition: improved  Discharge Orders   Future Orders Complete By Expires     Discharge activity:  Bathroom / Shower only  As directed     Discharge activity: Bedrest  As directed     Discharge diet:  No restrictions  As directed     Fetal Kick Count:  Lie on our left side for one hour after a meal, and count the number of times your baby kicks.  If it is less than 5 times, get up, move around and drink some juice.  Repeat the test 30 minutes later.  If it is still less than 5 kicks in an hour, notify your doctor.  As directed     No sexual activity restrictions  As directed         Medication List    TAKE these medications       cephALEXin 500 MG capsule  Commonly known as:  KEFLEX  Take 1 capsule (500 mg total) by mouth 4 (four) times daily.     loratadine 10 MG tablet  Commonly known as:  CLARITIN  Take 10 mg by mouth daily.     omeprazole 20 MG capsule  Commonly known as:  PRILOSEC  Take 20 mg by mouth daily.     vitamin B-12 100 MCG tablet  Commonly known as:  CYANOCOBALAMIN  Take 50 mcg by mouth daily.           Follow-up Information   Follow up with [facility] In 1 week. (They will call you with appointment)    Contact information:   [address] [place] [address] [phone]      Signed: Reva Bores 03/16/2013, 4:46 PM

## 2013-03-16 NOTE — Progress Notes (Signed)
Patient ID: Angel Pratt, female   DOB: Oct 12, 1990, 22 y.o.   MRN: [id number]  FACULTY PRACTICE ANTEPARTUM COMPREHENSIVE PROGRESS NOTE  Angel Pratt is a 22 y.o. [id number] at [redacted]w[redacted]d  who is admitted for Pyelonephritis.  Estimated Date of Delivery: 06/28/13 Fetal presentation is unsure.  Length of Stay:  2 Days. 03/14/2013  Subjective: Pt still has mild R flank/side pain/tenderness but has not needed pain medication overnight. No nausea/vomiting. No fever/chills. Tolerating PO. Patient reports good fetal movement.  She reports no uterine contractions, no bleeding and no loss of fluid per vagina.  Vitals:  Blood pressure 115/63, pulse 90, temperature 97.9 F (36.6 C), temperature source Oral, resp. rate 18, height 5\' 2"  (1.575 m), weight 78.019 kg (172 lb), last menstrual period 09/01/2012, SpO2 97.00%. Physical Examination: GEN:  WNWD, no distress HEENT:  NCAT, EOMI, conjunctiva clear NECK:  Supple, non-tender, no thyromegaly, trachea midline CV: RRR, no murmur RESP:  CTAB ABD:  Soft, no guarding or rebound, normal bowel sounds, mild R CVA and RUQ tenderness EXTREM:  Warm, well perfused, no edema or tenderness NEURO:  Alert, oriented, no focal deficits GU:  Deferred   Fetal Monitoring:  Baseline: 135 bpm, Variability: Fair (1-6 bpm), Accelerations: Reactive and Decelerations: Absent  Labs:  No results found for this or any previous visit (from the past 24 hour(s)).  Imaging Studies:    Abdominal U/S on admission showed only moderate right hydronephrosis  Medications:  Scheduled . cefTRIAXone (ROCEPHIN)  IV  1 g Intravenous Q12H  . docusate sodium  100 mg Oral Daily  . prenatal multivitamin  1 tablet Oral Q1200   I have reviewed the patient's current medications.  ASSESSMENT: Patient Active Problem List   Diagnosis Date Noted  . Maternal fevers and leukocytosis, antepartum 03/14/2013  . Possible acute pyelonephritis in second trimester, antepartum 03/14/2013  .  Supervision of other normal pregnancy 11/15/2012  . Hx of preeclampsia, prior pregnancy, currently pregnant 11/15/2012  . Cholelithiasis   . Rh negative state in antepartum period 11/01/2012    PLAN: FHTs reactive Afebrile for 36 hours (5:30 PM 6/10) Prelim Urine culture 100,000 cfu gram neg rods - likely E coli. F/U sensitivities Continue rocephin and transition to PO antibiotics tomorrow. Continue routine antenatal care. Likely D/C home tonight or tomorrow.  Napoleon Form, MD

## 2013-03-17 ENCOUNTER — Encounter: Payer: Self-pay | Admitting: Obstetrics & Gynecology

## 2013-03-23 ENCOUNTER — Ambulatory Visit (INDEPENDENT_AMBULATORY_CARE_PROVIDER_SITE_OTHER): Payer: 59 | Admitting: Obstetrics & Gynecology

## 2013-03-23 ENCOUNTER — Encounter: Payer: Self-pay | Admitting: Obstetrics & Gynecology

## 2013-03-23 VITALS — BP 119/86 | Wt 171.0 lb

## 2013-03-23 DIAGNOSIS — Z3480 Encounter for supervision of other normal pregnancy, unspecified trimester: Secondary | ICD-10-CM

## 2013-03-23 DIAGNOSIS — Z23 Encounter for immunization: Secondary | ICD-10-CM

## 2013-03-23 DIAGNOSIS — Z3483 Encounter for supervision of other normal pregnancy, third trimester: Secondary | ICD-10-CM

## 2013-03-23 DIAGNOSIS — O360131 Maternal care for anti-D [Rh] antibodies, third trimester, fetus 1: Secondary | ICD-10-CM

## 2013-03-23 DIAGNOSIS — O360990 Maternal care for other rhesus isoimmunization, unspecified trimester, not applicable or unspecified: Secondary | ICD-10-CM

## 2013-03-23 LAB — CBC
HCT: 34.4 % — ABNORMAL LOW (ref 36.0–46.0)
Hemoglobin: 11.2 g/dL — ABNORMAL LOW (ref 12.0–15.0)
RBC: 4.42 MIL/uL (ref 3.87–5.11)
RDW: 14.3 % (ref 11.5–15.5)
WBC: 13.1 10*3/uL — ABNORMAL HIGH (ref 4.0–10.5)

## 2013-03-23 MED ORDER — TETANUS-DIPHTH-ACELL PERTUSSIS 5-2.5-18.5 LF-MCG/0.5 IM SUSP
0.5000 mL | Freq: Once | INTRAMUSCULAR | Status: AC
  Administered 2013-03-23: 0.5 mL via INTRAMUSCULAR

## 2013-03-23 MED ORDER — RHO D IMMUNE GLOBULIN 1500 UNIT/2ML IJ SOLN
300.0000 ug | Freq: Once | INTRAMUSCULAR | Status: AC
  Administered 2013-03-23: 300 ug via INTRAMUSCULAR

## 2013-03-23 NOTE — Progress Notes (Signed)
Discharged on 03/16/13 after inpatient treatment of pyelonephritis; urine culture showed over >100K Klebsiella appropriately treated with Keflex.  Patient taking antibiotic, will finish on 03/30/13. Third trimester labs, Rhogam and Tdap today.  No other complaints or concerns.  Fetal movement and labor precautions reviewed.

## 2013-03-23 NOTE — Patient Instructions (Addendum)
Return to clinic for any obstetric concerns or go to [facility] for evaluation Pregnancy - Third Trimester The third trimester of pregnancy (the last 3 months) is a period of the most rapid growth for you and your baby. The baby approaches a length of 20 inches and a weight of 6 to 10 pounds. The baby is adding on fat and getting ready for life outside your body. While inside, babies have periods of sleeping and waking, sucking thumbs, and hiccuping. You can often feel small contractions of the uterus. This is false labor. It is also called Braxton-Hicks contractions. This is like a practice for labor. The usual problems in this stage of pregnancy include more difficulty breathing, swelling of the hands and feet from water retention, and having to urinate more often because of the uterus and baby pressing on your bladder.  PRENATAL EXAMS  Blood work may continue to be done during prenatal exams. These tests are done to check on your health and the probable health of your baby. Blood work is used to follow your blood levels (hemoglobin). Anemia (low hemoglobin) is common during pregnancy. Iron and vitamins are given to help prevent this. You may also continue to be checked for diabetes. Some of the past blood tests may be done again.  The size of the uterus is measured during each visit. This makes sure your baby is growing properly according to your pregnancy dates.  Your blood pressure is checked every prenatal visit. This is to make sure you are not getting toxemia.  Your urine is checked every prenatal visit for infection, diabetes, and protein.  Your weight is checked at each visit. This is done to make sure gains are happening at the suggested rate and that you and your baby are growing normally.  Sometimes, an ultrasound is performed to confirm the position and the proper growth and development of the baby. This is a test done that bounces harmless sound waves off the baby so your caregiver can more  accurately determine a due date.  Discuss the type of pain medicine and anesthesia you will have during your labor and delivery.  Discuss the possibility and anesthesia if a cesarean section might be necessary.  Inform your caregiver if there is any mental or physical violence at home. Sometimes, a specialized non-stress test, contraction stress test, and biophysical profile are done to make sure the baby is not having a problem. Checking the amniotic fluid surrounding the baby is called an amniocentesis. The amniotic fluid is removed by sticking a needle into the belly (abdomen). This is sometimes done near the end of pregnancy if an early delivery is required. In this case, it is done to help make sure the baby's lungs are mature enough for the baby to live outside of the womb. If the lungs are not mature and it is unsafe to deliver the baby, an injection of cortisone medicine is given to the mother 1 to 2 days before the delivery. This helps the baby's lungs mature and makes it safer to deliver the baby. CHANGES OCCURING IN THE THIRD TRIMESTER OF PREGNANCY Your body goes through many changes during pregnancy. They vary from person to person. Talk to your caregiver about changes you notice and are concerned about.  During the last trimester, you have probably had an increase in your appetite. It is normal to have cravings for certain foods. This varies from person to person and pregnancy to pregnancy.  You may begin to get stretch marks  on your hips, abdomen, and breasts. These are normal changes in the body during pregnancy. There are no exercises or medicines to take which prevent this change.  Constipation may be treated with a stool softener or adding bulk to your diet. Drinking lots of fluids, fiber in vegetables, fruits, and whole grains are helpful.  Exercising is also helpful. If you have been very active up until your pregnancy, most of these activities can be continued during your  pregnancy. If you have been less active, it is helpful to start an exercise program such as walking. Consult your caregiver before starting exercise programs.  Avoid all smoking, alcohol, non-prescribed drugs, herbs and "street drugs" during your pregnancy. These chemicals affect the formation and growth of the baby. Avoid chemicals throughout the pregnancy to ensure the delivery of a healthy infant.  Backache, varicose veins, and hemorrhoids may develop or get worse.  You will tire more easily in the third trimester, which is normal.  The baby's movements may be stronger and more often.  You may become short of breath easily.  Your belly button may stick out.  A yellow discharge may leak from your breasts called colostrum.  You may have a bloody mucus discharge. This usually occurs a few days to a week before labor begins. HOME CARE INSTRUCTIONS   Keep your caregiver's appointments. Follow your caregiver's instructions regarding medicine use, exercise, and diet.  During pregnancy, you are providing food for you and your baby. Continue to eat regular, well-balanced meals. Choose foods such as meat, fish, milk and other low fat dairy products, vegetables, fruits, and whole-grain breads and cereals. Your caregiver will tell you of the ideal weight gain.  A physical sexual relationship may be continued throughout pregnancy if there are no other problems such as early (premature) leaking of amniotic fluid from the membranes, vaginal bleeding, or belly (abdominal) pain.  Exercise regularly if there are no restrictions. Check with your caregiver if you are unsure of the safety of your exercises. Greater weight gain will occur in the last 2 trimesters of pregnancy. Exercising helps:  Control your weight.  Get you in shape for labor and delivery.  You lose weight after you deliver.  Rest a lot with legs elevated, or as needed for leg cramps or low back pain.  Wear a good support or jogging  bra for breast tenderness during pregnancy. This may help if worn during sleep. Pads or tissues may be used in the bra if you are leaking colostrum.  Do not use hot tubs, steam rooms, or saunas.  Wear your seat belt when driving. This protects you and your baby if you are in an accident.  Avoid raw meat, cat litter boxes and soil used by cats. These carry germs that can cause birth defects in the baby.  It is easier to leak urine during pregnancy. Tightening up and strengthening the pelvic muscles will help with this problem. You can practice stopping your urination while you are going to the bathroom. These are the same muscles you need to strengthen. It is also the muscles you would use if you were trying to stop from passing gas. You can practice tightening these muscles up 10 times a set and repeating this about 3 times per day. Once you know what muscles to tighten up, do not perform these exercises during urination. It is more likely to cause an infection by backing up the urine.  Ask for help if you have financial, counseling, or nutritional  needs during pregnancy. Your caregiver will be able to offer counseling for these needs as well as refer you for other special needs.  Make a list of emergency phone numbers and have them available.  Plan on getting help from family or friends when you go home from the hospital.  Make a trial run to the hospital.  Take prenatal classes with the father to understand, practice, and ask questions about the labor and delivery.  Prepare the baby's room or nursery.  Do not travel out of the city unless it is absolutely necessary and with the advice of your caregiver.  Wear only low or no heal shoes to have better balance and prevent falling. MEDICINES AND DRUG USE IN PREGNANCY  Take prenatal vitamins as directed. The vitamin should contain 1 milligram of folic acid. Keep all vitamins out of reach of children. Only a couple vitamins or tablets  containing iron may be fatal to a baby or young child when ingested.  Avoid use of all medicines, including herbs, over-the-counter medicines, not prescribed or suggested by your caregiver. Only take over-the-counter or prescription medicines for pain, discomfort, or fever as directed by your caregiver. Do not use aspirin, ibuprofen or naproxen unless approved by your caregiver.  Let your caregiver also know about herbs you may be using.  Alcohol is related to a number of birth defects. This includes fetal alcohol syndrome. All alcohol, in any form, should be avoided completely. Smoking will cause low birth rate and premature babies.  Illegal drugs are very harmful to the baby. They are absolutely forbidden. A baby born to an addicted mother will be addicted at birth. The baby will go through the same withdrawal an adult does. SEEK MEDICAL CARE IF: You have any concerns or worries during your pregnancy. It is better to call with your questions if you feel they cannot wait, rather than worry about them. SEEK IMMEDIATE MEDICAL CARE IF:   An unexplained oral temperature above 102 F (38.9 C) develops, or as your caregiver suggests.  You have leaking of fluid from the vagina. If leaking membranes are suspected, take your temperature and tell your caregiver of this when you call.  There is vaginal spotting, bleeding or passing clots. Tell your caregiver of the amount and how many pads are used.  You develop a bad smelling vaginal discharge with a change in the color from clear to white.  You develop vomiting that lasts more than 24 hours.  You develop chills or fever.  You develop shortness of breath.  You develop burning on urination.  You loose more than 2 pounds of weight or gain more than 2 pounds of weight or as suggested by your caregiver.  You notice sudden swelling of your face, hands, and feet or legs.  You develop belly (abdominal) pain. Round ligament discomfort is a common  non-cancerous (benign) cause of abdominal pain in pregnancy. Your caregiver still must evaluate you.  You develop a severe headache that does not go away.  You develop visual problems, blurred or double vision.  If you have not felt your baby move for more than 1 hour. If you think the baby is not moving as much as usual, eat something with sugar in it and lie down on your left side for an hour. The baby should move at least 4 to 5 times per hour. Call right away if your baby moves less than that.  You fall, are in a car accident, or any kind of  trauma.  There is mental or physical violence at home. Document Released: 09/15/2001 Document Revised: 06/15/2012 Document Reviewed: 03/20/2009 [facility] Patient Information 2014 [place], [place].

## 2013-03-24 LAB — HIV ANTIBODY (ROUTINE TESTING W REFLEX): HIV: NONREACTIVE

## 2013-03-24 LAB — GLUCOSE TOLERANCE, 1 HOUR (50G) W/O FASTING: Glucose, 1 Hour GTT: 123 mg/dL (ref 70–140)

## 2013-03-27 ENCOUNTER — Ambulatory Visit (INDEPENDENT_AMBULATORY_CARE_PROVIDER_SITE_OTHER): Payer: 59 | Admitting: Obstetrics and Gynecology

## 2013-03-27 ENCOUNTER — Encounter: Payer: Self-pay | Admitting: Obstetrics and Gynecology

## 2013-03-27 VITALS — BP 129/91 | Wt 172.0 lb

## 2013-03-27 DIAGNOSIS — K8020 Calculus of gallbladder without cholecystitis without obstruction: Secondary | ICD-10-CM

## 2013-03-27 DIAGNOSIS — N10 Acute tubulo-interstitial nephritis: Secondary | ICD-10-CM

## 2013-03-27 DIAGNOSIS — O09299 Supervision of pregnancy with other poor reproductive or obstetric history, unspecified trimester: Secondary | ICD-10-CM

## 2013-03-27 DIAGNOSIS — O360990 Maternal care for other rhesus isoimmunization, unspecified trimester, not applicable or unspecified: Secondary | ICD-10-CM

## 2013-03-27 DIAGNOSIS — O360121 Maternal care for anti-D [Rh] antibodies, second trimester, fetus 1: Secondary | ICD-10-CM

## 2013-03-27 DIAGNOSIS — O09292 Supervision of pregnancy with other poor reproductive or obstetric history, second trimester: Secondary | ICD-10-CM

## 2013-03-27 DIAGNOSIS — O2390 Unspecified genitourinary tract infection in pregnancy, unspecified trimester: Secondary | ICD-10-CM

## 2013-03-27 DIAGNOSIS — Z3480 Encounter for supervision of other normal pregnancy, unspecified trimester: Secondary | ICD-10-CM

## 2013-03-27 DIAGNOSIS — Z3482 Encounter for supervision of other normal pregnancy, second trimester: Secondary | ICD-10-CM

## 2013-03-27 NOTE — Progress Notes (Signed)
Patient doing well. Will complete keflex on Thursday. Patient reports non-pruritic white discharge and is concerned of yeast or BV infection. Patient also recently changed body wash and has developed swelling in her vulva which makes it painful to walk or sit at times. Pelvic exam- edema of vulva majora and minora. Thin white, non-malodorous discharge. Wet prep collected. Advised to apply cold compresses to vulva and to return to original brand of soap/body wash that she was using prior to development of edema. Monitor BP. Patient without any symptoms

## 2013-03-27 NOTE — Progress Notes (Signed)
P - 121 - Pt is taking Keflex and thinks she may have yeast infection - Pt states she has vaginal pain while walking and discharge has turned white in the last day

## 2013-03-27 NOTE — Addendum Note (Signed)
Addended by: Catalina Antigua on: 03/27/2013 02:02 PM   Modules accepted: Orders

## 2013-03-29 ENCOUNTER — Telehealth: Payer: Self-pay | Admitting: *Deleted

## 2013-03-29 DIAGNOSIS — B379 Candidiasis, unspecified: Secondary | ICD-10-CM

## 2013-03-29 LAB — WET PREP, GENITAL: WBC, Wet Prep HPF POC: NONE SEEN

## 2013-03-29 MED ORDER — FLUCONAZOLE 150 MG PO TABS: 150.0000 mg | ORAL_TABLET | Freq: Once | ORAL | Status: DC

## 2013-03-29 NOTE — Telephone Encounter (Signed)
Pt called in and wanted to know the results of her wet prep.  Wet prep showed pt had some yeast.  Sent in Diflucan to her pharmacy.

## 2013-04-11 ENCOUNTER — Ambulatory Visit (INDEPENDENT_AMBULATORY_CARE_PROVIDER_SITE_OTHER): Payer: 59 | Admitting: Obstetrics & Gynecology

## 2013-04-11 VITALS — BP 130/90 | Wt 178.0 lb

## 2013-04-11 DIAGNOSIS — O2390 Unspecified genitourinary tract infection in pregnancy, unspecified trimester: Secondary | ICD-10-CM

## 2013-04-11 DIAGNOSIS — O139 Gestational [pregnancy-induced] hypertension without significant proteinuria, unspecified trimester: Secondary | ICD-10-CM

## 2013-04-11 DIAGNOSIS — O09299 Supervision of pregnancy with other poor reproductive or obstetric history, unspecified trimester: Secondary | ICD-10-CM

## 2013-04-11 DIAGNOSIS — O133 Gestational [pregnancy-induced] hypertension without significant proteinuria, third trimester: Secondary | ICD-10-CM

## 2013-04-11 DIAGNOSIS — O09293 Supervision of pregnancy with other poor reproductive or obstetric history, third trimester: Secondary | ICD-10-CM

## 2013-04-11 DIAGNOSIS — N760 Acute vaginitis: Secondary | ICD-10-CM

## 2013-04-11 DIAGNOSIS — B373 Candidiasis of vulva and vagina: Secondary | ICD-10-CM

## 2013-04-11 LAB — COMPREHENSIVE METABOLIC PANEL
AST: 8 U/L (ref 0–37)
Albumin: 3.4 g/dL — ABNORMAL LOW (ref 3.5–5.2)
Alkaline Phosphatase: 73 U/L (ref 39–117)
BUN: 6 mg/dL (ref 6–23)
Creat: 0.59 mg/dL (ref 0.50–1.10)
Potassium: 4.1 mEq/L (ref 3.5–5.3)

## 2013-04-11 LAB — CBC
HCT: 29.9 % — ABNORMAL LOW (ref 36.0–46.0)
MCHC: 34.1 g/dL (ref 30.0–36.0)
RDW: 15.3 % (ref 11.5–15.5)

## 2013-04-11 NOTE — Progress Notes (Addendum)
Left labium minus is more edmeatous than right side, no erythema, no tenderness.  Patient reports irritation when it rubs on underwear.  Still has white discharge, was treated for yeast infection with Diflucan x 2 doses after wet prep showed yeast on 03/27/13. Wet prep repeated; will follow up results and manage accordingly.  GHTN diagnosed today given two episodes of DBP ? 90 mm Hg, ordered labs, ultrasound and BPP. Will continue to watch closely. No other complaints or concerns.  Fetal movement and blood pressure labor precautions reviewed.  Addendum: Wet prep showed moderate yeast, extended Diflucan course (3 doses, 3 days apart) prescribed.  GHTN labs normal, no evidence of preeclampsia.  Will continue to monitor closely.

## 2013-04-11 NOTE — Progress Notes (Signed)
P-116.    Pt thinks she still has a yeast infection.  Pt having white discharge and itching in vaginal area.

## 2013-04-11 NOTE — Patient Instructions (Signed)

## 2013-04-12 LAB — WET PREP, GENITAL
Clue Cells Wet Prep HPF POC: NONE SEEN
Trich, Wet Prep: NONE SEEN

## 2013-04-12 LAB — PROTEIN / CREATININE RATIO, URINE: Protein Creatinine Ratio: 0.1 (ref <0.15)

## 2013-04-12 MED ORDER — FLUCONAZOLE 150 MG PO TABS: 150.0000 mg | ORAL_TABLET | ORAL | Status: DC

## 2013-04-12 NOTE — Addendum Note (Signed)
Addended by: [name] A on: 04/12/2013 04:06 PM   Modules accepted: Orders

## 2013-04-13 ENCOUNTER — Telehealth: Payer: Self-pay | Admitting: *Deleted

## 2013-04-13 NOTE — Telephone Encounter (Signed)
Pt aware.

## 2013-04-13 NOTE — Telephone Encounter (Signed)
Message copied by Jourdin Gens C on Thu Apr 13, 2013 11:12 AM ------      Message from: [name] A      Created: Wed Apr 12, 2013  4:01 PM       Labs were normal, but wet prep showed yeast infection.  Diflucan prescribed. Please call to inform patient of results.       ------

## 2013-04-17 ENCOUNTER — Encounter (HOSPITAL_COMMUNITY): Payer: Self-pay | Admitting: *Deleted

## 2013-04-17 ENCOUNTER — Encounter: Payer: Self-pay | Admitting: Obstetrics & Gynecology

## 2013-04-17 ENCOUNTER — Ambulatory Visit (HOSPITAL_COMMUNITY)
Admission: RE | Admit: 2013-04-17 | Discharge: 2013-04-17 | Disposition: A | Discharge status: Home / Self Care | Payer: 59 | Source: Ambulatory Visit | Attending: Obstetrics & Gynecology | Admitting: Obstetrics & Gynecology

## 2013-04-17 ENCOUNTER — Inpatient Hospital Stay (HOSPITAL_COMMUNITY)
Admission: AD | Admit: 2013-04-17 | Discharge: 2013-04-17 | Disposition: A | Discharge status: Home / Self Care | Payer: BC Managed Care – PPO | Source: Ambulatory Visit | Attending: Obstetrics & Gynecology | Admitting: Obstetrics & Gynecology

## 2013-04-17 VITALS — BP 119/78 | Wt 178.0 lb

## 2013-04-17 DIAGNOSIS — O358XX0 Maternal care for other (suspected) fetal abnormality and damage, not applicable or unspecified: Secondary | ICD-10-CM

## 2013-04-17 DIAGNOSIS — O139 Gestational [pregnancy-induced] hypertension without significant proteinuria, unspecified trimester: Secondary | ICD-10-CM | POA: Diagnosis present

## 2013-04-17 DIAGNOSIS — O359XX0 Maternal care for (suspected) fetal abnormality and damage, unspecified, not applicable or unspecified: Secondary | ICD-10-CM | POA: Insufficient documentation

## 2013-04-17 DIAGNOSIS — O3660X0 Maternal care for excessive fetal growth, unspecified trimester, not applicable or unspecified: Secondary | ICD-10-CM | POA: Insufficient documentation

## 2013-04-17 DIAGNOSIS — O133 Gestational [pregnancy-induced] hypertension without significant proteinuria, third trimester: Secondary | ICD-10-CM

## 2013-04-17 DIAGNOSIS — O0993 Supervision of high risk pregnancy, unspecified, third trimester: Secondary | ICD-10-CM

## 2013-04-17 DIAGNOSIS — O09299 Supervision of pregnancy with other poor reproductive or obstetric history, unspecified trimester: Secondary | ICD-10-CM | POA: Insufficient documentation

## 2013-04-17 DIAGNOSIS — O409XX0 Polyhydramnios, unspecified trimester, not applicable or unspecified: Secondary | ICD-10-CM | POA: Insufficient documentation

## 2013-04-17 DIAGNOSIS — O132 Gestational [pregnancy-induced] hypertension without significant proteinuria, second trimester: Secondary | ICD-10-CM

## 2013-04-17 DIAGNOSIS — O2620 Pregnancy care for patient with recurrent pregnancy loss, unspecified trimester: Secondary | ICD-10-CM | POA: Insufficient documentation

## 2013-04-17 DIAGNOSIS — O09293 Supervision of pregnancy with other poor reproductive or obstetric history, third trimester: Secondary | ICD-10-CM

## 2013-04-17 DIAGNOSIS — N10 Acute tubulo-interstitial nephritis: Secondary | ICD-10-CM

## 2013-04-17 HISTORY — DX: Unspecified ovarian cyst, unspecified side: N83.209

## 2013-04-17 HISTORY — DX: Anemia, unspecified: D64.9

## 2013-04-17 NOTE — MAU Provider Note (Signed)
  History     CSN: [id number]  Arrival date and time: 04/17/13 1336   None     No chief complaint on file.  HPI 22 y.o. [id number] at [redacted]w[redacted]d sent from MFM for NST due to 6/8 BPP (-2 for no fetal breathing).   Pt [facility]. Pregnancy complicated by GHTN (diastolics in 90s). Had growth sono and BPP today at MFM. Also noted polyhydramnios and fetal pelvic cystic mass. Recommended NST, weekly BPPs until 32 weeks then twice weekly NST/AFI till delivery.   OB History   Grav Para Term Preterm Abortions TAB SAB Ect Mult Living   5 1 1  3  3   1       Past Medical History  Diagnosis Date  . Preeclampsia   . Pregnancy induced hypertension   . Cholelithiasis   . Pyelonephritis complicating pregnancy 03/2013  . Anemia   . Ovarian cyst     Past Surgical History  Procedure Laterality Date  . Tonsillectomy      2009  . Dilation and curettage of uterus      Family History  Problem Relation Age of Onset  . Cancer Mother     cervial dysplasia  . Ovarian cysts Mother   . Cancer Maternal Aunt     cervical  . Cancer Maternal Grandmother 40    Cervical and Breast one breast  . Ovarian cysts Maternal Grandmother   . Diabetes Maternal Grandfather     History  Substance Use Topics  . Smoking status: Never Smoker   . Smokeless tobacco: Never Used  . Alcohol Use: No    Allergies:  Allergies  Allergen Reactions  . Codeine Hives  . Latex Hives    Prescriptions prior to admission  Medication Sig Dispense Refill  . fluconazole (DIFLUCAN) 150 MG tablet Take 1 tablet (150 mg total) by mouth every 3 (three) days. For three doses  3 tablet  1  . loratadine (CLARITIN) 10 MG tablet Take 10 mg by mouth daily.      . vitamin B-12 (CYANOCOBALAMIN) 50 MCG tablet Take 50 mcg by mouth daily.        ROS  See HPI  Physical Exam   Blood pressure 127/85, pulse 100, temperature 97.1 F (36.2 C), temperature source Oral, resp. rate 18, last  menstrual period 09/01/2012.  Physical Exam GEN:  WNWD, no distress HEENT:  NCAT, EOMI, conjunctiva clear NECK:  Supple, non-tender, no thyromegaly, trachea midline CV: RRR, no murmur RESP:  CTAB ABD:  Soft, non-tender, no guarding or rebound, normal bowel sounds EXTREM:  Warm, well perfused, no edema or tenderness NEURO:  Alert, oriented, no focal deficits   MAU Course  Procedures  NST: 135, good variability, accels present, no decels - appropriate for gestational age. Reactive.  Assessment and Plan  22 y.o. [id number] at [redacted]w[redacted]d here for NST due to 6/8 BPP and GHTN. - BP normal - NST reactive - Stable for discharge home. -  F/U [facility] 04/26/13 at 9:00 and [facility]FM for [facility] 04/26/13 11:00 AM.  Napoleon Form 04/17/2013, 2:01 PM

## 2013-04-17 NOTE — Progress Notes (Signed)
Angel Pratt  was seen today for an ultrasound appointment.  See full report in [facility]  Impression: Single IUP at 30 0/[redacted] weeks Gestational hypertension - seen today for growth and BPP. Interval growth is appropriate (46th %tile) BPP 6/8 (-2 for absent breathing movement)  A 2.55 x 2.64 x 2.57 cm simple cyst is noted in the fetal pelvis.  It appears to arise separately from the fetal bladder.  The fetus is female - findings most consistent with a fetal simple ovarian cyst.  Differential diagnosis includes mesenteric cyst or an isolated dilated loop of bowel.  Recommendations: Recommend weekly BPPs until 32 weeks - 2x weekly NSTS with weekly AFIs thereafter. Follow up growth scan in 3 weeks.  Patient was sent to MAU for NST due to BPP of 6/8.  Case was discussed with Dr. Debroah Loop.  Will reevaluate pelvic cyst at next follow up.  Alpha Gula, MD

## 2013-04-17 NOTE — MAU Note (Signed)
Sent from MFM for NST,

## 2013-04-18 NOTE — Addendum Note (Signed)
Encounter addended by: Alessandra Bevels. Chase Picket, RN on: 04/18/2013 10:08 AM<BR>     Documentation filed: Charges VN, OB Prenatal Vitals, Episodes

## 2013-04-26 ENCOUNTER — Ambulatory Visit (HOSPITAL_COMMUNITY)
Admission: RE | Admit: 2013-04-26 | Discharge: 2013-04-26 | Disposition: A | Discharge status: Home / Self Care | Payer: 59 | Source: Ambulatory Visit | Attending: Obstetrics and Gynecology | Admitting: Obstetrics and Gynecology

## 2013-04-26 ENCOUNTER — Other Ambulatory Visit: Payer: Self-pay | Admitting: Obstetrics and Gynecology

## 2013-04-26 ENCOUNTER — Ambulatory Visit (INDEPENDENT_AMBULATORY_CARE_PROVIDER_SITE_OTHER): Payer: 59 | Admitting: Obstetrics and Gynecology

## 2013-04-26 ENCOUNTER — Encounter (HOSPITAL_COMMUNITY): Payer: Self-pay

## 2013-04-26 ENCOUNTER — Encounter: Payer: Self-pay | Admitting: Obstetrics and Gynecology

## 2013-04-26 VITALS — BP 121/77 | HR 106 | Wt 182.5 lb

## 2013-04-26 VITALS — BP 121/92 | Wt 182.0 lb

## 2013-04-26 DIAGNOSIS — O133 Gestational [pregnancy-induced] hypertension without significant proteinuria, third trimester: Secondary | ICD-10-CM

## 2013-04-26 DIAGNOSIS — O359XX1 Maternal care for (suspected) fetal abnormality and damage, unspecified, fetus 1: Secondary | ICD-10-CM

## 2013-04-26 DIAGNOSIS — O0993 Supervision of high risk pregnancy, unspecified, third trimester: Secondary | ICD-10-CM

## 2013-04-26 DIAGNOSIS — N10 Acute tubulo-interstitial nephritis: Secondary | ICD-10-CM

## 2013-04-26 DIAGNOSIS — O139 Gestational [pregnancy-induced] hypertension without significant proteinuria, unspecified trimester: Secondary | ICD-10-CM | POA: Insufficient documentation

## 2013-04-26 DIAGNOSIS — O409XX0 Polyhydramnios, unspecified trimester, not applicable or unspecified: Secondary | ICD-10-CM

## 2013-04-26 DIAGNOSIS — N1330 Unspecified hydronephrosis: Secondary | ICD-10-CM

## 2013-04-26 DIAGNOSIS — O09299 Supervision of pregnancy with other poor reproductive or obstetric history, unspecified trimester: Secondary | ICD-10-CM

## 2013-04-26 DIAGNOSIS — O360990 Maternal care for other rhesus isoimmunization, unspecified trimester, not applicable or unspecified: Secondary | ICD-10-CM

## 2013-04-26 DIAGNOSIS — O09293 Supervision of pregnancy with other poor reproductive or obstetric history, third trimester: Secondary | ICD-10-CM

## 2013-04-26 DIAGNOSIS — O359XX0 Maternal care for (suspected) fetal abnormality and damage, unspecified, not applicable or unspecified: Secondary | ICD-10-CM

## 2013-04-26 DIAGNOSIS — O2302 Infections of kidney in pregnancy, second trimester: Secondary | ICD-10-CM

## 2013-04-26 DIAGNOSIS — O403XX0 Polyhydramnios, third trimester, not applicable or unspecified: Secondary | ICD-10-CM

## 2013-04-26 DIAGNOSIS — O0990 Supervision of high risk pregnancy, unspecified, unspecified trimester: Secondary | ICD-10-CM

## 2013-04-26 DIAGNOSIS — K8020 Calculus of gallbladder without cholecystitis without obstruction: Secondary | ICD-10-CM

## 2013-04-26 DIAGNOSIS — O360131 Maternal care for anti-D [Rh] antibodies, third trimester, fetus 1: Secondary | ICD-10-CM

## 2013-04-26 DIAGNOSIS — O2390 Unspecified genitourinary tract infection in pregnancy, unspecified trimester: Secondary | ICD-10-CM

## 2013-04-26 DIAGNOSIS — O358XX0 Maternal care for other (suspected) fetal abnormality and damage, not applicable or unspecified: Secondary | ICD-10-CM | POA: Insufficient documentation

## 2013-04-26 NOTE — Progress Notes (Signed)
[facility] ultrasound Indication: 22 yr old [id number] at [redacted]w[redacted]d with gestational hypertension and fetus with pelvic cyst for biophysical profile.  Findings: 1. Single intrauterine pregnancy. 2. Posterior placenta without evidence of previa. 3. Normal amniotic fluid index; atlhough slightly increased for gestational age. 4. Normal biophysical profile of 8/8.  Recommendations: 1. Gestational hypertension: - previously counseled - recommend fetal growth every 4 weeks; due in 2-3 weeks - recommend continue antenatal testing - recommend weekly labs to evaluate for severe preeclampsia - recommend close monitoring for development of severe preeclampsia or severe gestational hypertension - recommend delivery at 37 weeks or sooner for severe preeclampsia or gestational hypertension - recommend course of betamethasone if has not already had done 2. Fetal pelvic cyst: - previously counseled - will reevaluate on follow up ultrasound - inform Pediatrics at delivery  Eulis Foster, MD

## 2013-04-26 NOTE — Progress Notes (Signed)
P - 104 

## 2013-04-26 NOTE — Progress Notes (Signed)
Patient doing well without complaints. FM/PTL/preeclampsia precautions reviewed. Patient states she has been monitoring BP at home and highest was 117/78. Continue monitoring BP. F/U BPP with MFM today start twice weekly fetal testing at 32 weeks

## 2013-05-03 ENCOUNTER — Encounter (HOSPITAL_COMMUNITY): Payer: Self-pay | Admitting: *Deleted

## 2013-05-03 ENCOUNTER — Ambulatory Visit (HOSPITAL_COMMUNITY)
Admission: RE | Admit: 2013-05-03 | Discharge: 2013-05-03 | Disposition: A | Discharge status: Home / Self Care | Payer: 59 | Source: Ambulatory Visit | Attending: Obstetrics and Gynecology | Admitting: Obstetrics and Gynecology

## 2013-05-03 ENCOUNTER — Other Ambulatory Visit (HOSPITAL_COMMUNITY): Payer: 59

## 2013-05-03 ENCOUNTER — Inpatient Hospital Stay (HOSPITAL_COMMUNITY)
Admission: AD | Admit: 2013-05-03 | Discharge: 2013-05-05 | DRG: 781 | Disposition: A | Discharge status: Home / Self Care | Payer: 59 | Source: Ambulatory Visit | Attending: Obstetrics and Gynecology | Admitting: Obstetrics and Gynecology

## 2013-05-03 DIAGNOSIS — O360131 Maternal care for anti-D [Rh] antibodies, third trimester, fetus 1: Secondary | ICD-10-CM

## 2013-05-03 DIAGNOSIS — O139 Gestational [pregnancy-induced] hypertension without significant proteinuria, unspecified trimester: Secondary | ICD-10-CM | POA: Insufficient documentation

## 2013-05-03 DIAGNOSIS — N1330 Unspecified hydronephrosis: Secondary | ICD-10-CM

## 2013-05-03 DIAGNOSIS — O2303 Infections of kidney in pregnancy, third trimester: Secondary | ICD-10-CM

## 2013-05-03 DIAGNOSIS — O26839 Pregnancy related renal disease, unspecified trimester: Secondary | ICD-10-CM | POA: Diagnosis present

## 2013-05-03 DIAGNOSIS — N200 Calculus of kidney: Secondary | ICD-10-CM

## 2013-05-03 DIAGNOSIS — O358XX0 Maternal care for other (suspected) fetal abnormality and damage, not applicable or unspecified: Secondary | ICD-10-CM | POA: Insufficient documentation

## 2013-05-03 DIAGNOSIS — N10 Acute tubulo-interstitial nephritis: Secondary | ICD-10-CM

## 2013-05-03 DIAGNOSIS — O133 Gestational [pregnancy-induced] hypertension without significant proteinuria, third trimester: Secondary | ICD-10-CM

## 2013-05-03 DIAGNOSIS — O09293 Supervision of pregnancy with other poor reproductive or obstetric history, third trimester: Secondary | ICD-10-CM

## 2013-05-03 DIAGNOSIS — O409XX0 Polyhydramnios, unspecified trimester, not applicable or unspecified: Secondary | ICD-10-CM | POA: Diagnosis present

## 2013-05-03 DIAGNOSIS — O2620 Pregnancy care for patient with recurrent pregnancy loss, unspecified trimester: Secondary | ICD-10-CM | POA: Insufficient documentation

## 2013-05-03 DIAGNOSIS — O3660X0 Maternal care for excessive fetal growth, unspecified trimester, not applicable or unspecified: Secondary | ICD-10-CM | POA: Insufficient documentation

## 2013-05-03 DIAGNOSIS — O403XX0 Polyhydramnios, third trimester, not applicable or unspecified: Secondary | ICD-10-CM

## 2013-05-03 DIAGNOSIS — O0993 Supervision of high risk pregnancy, unspecified, third trimester: Secondary | ICD-10-CM

## 2013-05-03 DIAGNOSIS — O09299 Supervision of pregnancy with other poor reproductive or obstetric history, unspecified trimester: Secondary | ICD-10-CM | POA: Insufficient documentation

## 2013-05-03 DIAGNOSIS — O359XX1 Maternal care for (suspected) fetal abnormality and damage, unspecified, fetus 1: Secondary | ICD-10-CM

## 2013-05-03 DIAGNOSIS — O2390 Unspecified genitourinary tract infection in pregnancy, unspecified trimester: Secondary | ICD-10-CM

## 2013-05-03 DIAGNOSIS — N12 Tubulo-interstitial nephritis, not specified as acute or chronic: Secondary | ICD-10-CM

## 2013-05-03 LAB — WET PREP, GENITAL
Clue Cells Wet Prep HPF POC: NONE SEEN
Trich, Wet Prep: NONE SEEN
Yeast Wet Prep HPF POC: NONE SEEN

## 2013-05-03 LAB — URINALYSIS, ROUTINE W REFLEX MICROSCOPIC
Glucose, UA: NEGATIVE mg/dL
Ketones, ur: NEGATIVE mg/dL
Protein, ur: 100 mg/dL — AB
pH: 6.5 (ref 5.0–8.0)

## 2013-05-03 MED ORDER — ACETAMINOPHEN 325 MG PO TABS: 650.0000 mg | ORAL_TABLET | ORAL | Status: DC | PRN

## 2013-05-03 MED ORDER — HYDROCODONE-ACETAMINOPHEN 5-325 MG PO TABS
2.0000 | ORAL_TABLET | Freq: Once | ORAL | Status: AC
  Administered 2013-05-03: 2 via ORAL
  Filled 2013-05-03: qty 2

## 2013-05-03 MED ORDER — ZOLPIDEM TARTRATE 5 MG PO TABS: 5.0000 mg | ORAL_TABLET | Freq: Every evening | ORAL | Status: DC | PRN

## 2013-05-03 MED ORDER — PRENATAL MULTIVITAMIN CH
1.0000 | ORAL_TABLET | Freq: Every day | ORAL | Status: DC
  Administered 2013-05-04 – 2013-05-05 (×2): 1 via ORAL
  Filled 2013-05-03 (×3): qty 1

## 2013-05-03 MED ORDER — DOCUSATE SODIUM 100 MG PO CAPS
100.0000 mg | ORAL_CAPSULE | Freq: Every day | ORAL | Status: DC
  Administered 2013-05-04 – 2013-05-05 (×2): 100 mg via ORAL
  Filled 2013-05-03 (×3): qty 1

## 2013-05-03 MED ORDER — DEXTROSE 5 % IV SOLN
1.0000 g | Freq: Two times a day (BID) | INTRAVENOUS | Status: DC
  Administered 2013-05-03 – 2013-05-04 (×3): 1 g via INTRAVENOUS
  Filled 2013-05-03 (×4): qty 10

## 2013-05-03 MED ORDER — CALCIUM CARBONATE ANTACID 500 MG PO CHEW
2.0000 | CHEWABLE_TABLET | ORAL | Status: DC | PRN
  Filled 2013-05-03: qty 2

## 2013-05-03 NOTE — MAU Note (Signed)
Pt reports having right side and abd pain for the past 3 days. reports she thinks it may be a kidney infection. She had one about a month ago and was hospitalized.

## 2013-05-03 NOTE — H&P (Signed)
Angel Pratt is a 22 y.o. [phone] at [redacted]w[redacted]d admitted for pyelonephritis.   0 Fetal presentation is unsure.  History of Present Illness: Pt complains of right flank/back pain and lower abdominal pain for the past week. Called office and started on macrobid by phone and has taken for 4 days without relief. Pain is getting worse, and feels like she is having contractions. No bleeding or loss of fluid. No fever/chills, no nausea or vomiting, no diarrhea. Baby moving well. No vaginal discharge. Pt was admitted 03/14/13 to 03/16/13 with pylenephritis with Klebsiella positive urine culture. She has not had symptoms since then. She also has a history of cholelithiasis but RUQ sono negative on prior admission.   Patient reports the fetal movement as active. Patient reports uterine contraction  activity as irregular crampy/sharp pains. Patient reports  vaginal bleeding as none. Patient describes fluid per vagina as None.  She receives prenatal [facility] at [facility]. She had polyhydramnios on ultrasound on 7/14, a fetal pelvic cyst, normal genetic screens, normal 1 hour GTT 9123), and gestation hypertension with two diastolic BP in the 90s this pregnancy. AFI was normal on 04/29/13. She has had one full term vaginal delivery.  Patient Active Problem List   Diagnosis Date Noted  . Polyhydramnios in third trimester, antepartum complication 04/26/2013  . Fetal pelvic cyst, antepartum 04/17/2013  . Gestational hypertension 04/11/2013  . Pyelonephritis 03/16/2013  . Hydronephrosis, right 03/16/2013  . Maternal fevers and leukocytosis, antepartum 03/14/2013  . Acute pyelonephritis in second trimester, antepartum 03/14/2013  . Supervision of high-risk pregnancy 11/15/2012  . Hx of preeclampsia, prior pregnancy, currently pregnant 11/15/2012  . Cholelithiasis   . Rh negative state in antepartum period 11/01/2012   Past Medical History: Past Medical History  Diagnosis Date  . Preeclampsia   .  Pregnancy induced hypertension   . Cholelithiasis   . Pyelonephritis complicating pregnancy 03/2013  . Anemia   . Ovarian cyst     Past Surgical History: Past Surgical History  Procedure Laterality Date  . Tonsillectomy      2009  . Dilation and curettage of uterus      Obstetrical History: OB History   Grav Para Term Preterm Abortions TAB SAB Ect Mult Living   5 1 1  3  3   1      Social History: History   Social History  . Marital Status: Married    Spouse Name: N/A    Number of Children: N/A  . Years of Education: N/A   Social History Main Topics  . Smoking status: Never Smoker   . Smokeless tobacco: Never Used  . Alcohol Use: No  . Drug Use: No  . Sexually Active: Yes -- Female partner(s)    Birth Control/ Protection: Condom   Other Topics Concern  . None   Social History Narrative  . None    Family History: Family History  Problem Relation Age of Onset  . Cancer Mother     cervial dysplasia  . Ovarian cysts Mother   . Cancer Maternal Aunt     cervical  . Cancer Maternal Grandmother 40    Cervical and Breast one breast  . Ovarian cysts Maternal Grandmother   . Diabetes Maternal Grandfather     Allergies: Allergies  Allergen Reactions  . Codeine Hives    Pt says she can take vicodin  . Latex Hives    Prescriptions prior to admission  Medication Sig Dispense Refill  . loratadine (CLARITIN) 10  MG tablet Take 10 mg by mouth daily.      . nitrofurantoin, macrocrystal-monohydrate, (MACROBID) 100 MG capsule Take 100 mg by mouth 2 (two) times daily.      . vitamin B-12 (CYANOCOBALAMIN) 50 MCG tablet Take 50 mcg by mouth every other day.         Review of Systems - See HPI  Vitals:  BP 114/83  Pulse 115  Temp(Src) 98 F (36.7 C) (Oral)  Resp 18  LMP 09/01/2012 Physical Examination: GEN:  WNWD, no distress HEENT:  NCAT, EOMI, conjunctiva clear NECK:  Supple, non-tender, no thyromegaly, trachea midline CV: RRR, no murmur RESP:  CTAB ABD:   Soft, non-tender, no guarding or rebound, normal bowel sounds EXTREM:  Warm, well perfused, no edema or tenderness NEURO:  Alert, oriented, no focal deficits GU:  Normal external genitalia, normal vagina, moderate creamy white discharge, no fluid or bleeding noted. No CMT.  Cervix: Dilation: Fingertip Effacement (%): Thick Cervical Position: Posterior   Fetal Monitoring:Baseline: 135-140 bpm, Variability: Good {> 6 bpm), Accelerations: Reactive and Decelerations: Absent Labs:  Results for orders placed during the hospital encounter of 05/03/13 (from the past 24 hour(s))  URINALYSIS, ROUTINE W REFLEX MICROSCOPIC   Collection Time    05/03/13  8:10 PM      Result Value Range   Color, Urine YELLOW  YELLOW   APPearance TURBID (*) CLEAR   Specific Gravity, Urine 1.020  1.005 - 1.030   pH 6.5  5.0 - 8.0   Glucose, UA NEGATIVE  NEGATIVE mg/dL   Hgb urine dipstick MODERATE (*) NEGATIVE   Bilirubin Urine NEGATIVE  NEGATIVE   Ketones, ur NEGATIVE  NEGATIVE mg/dL   Protein, ur 841 (*) NEGATIVE mg/dL   Urobilinogen, UA 0.2  0.0 - 1.0 mg/dL   Nitrite NEGATIVE  NEGATIVE   Leukocytes, UA MODERATE (*) NEGATIVE  URINE MICROSCOPIC-ADD ON   Collection Time    05/03/13  8:10 PM      Result Value Range   Squamous Epithelial / LPF RARE  RARE   WBC, UA 21-50  <3 WBC/hpf   RBC / HPF 7-10  <3 RBC/hpf   Bacteria, UA RARE  RARE  WET PREP, GENITAL   Collection Time    05/03/13  8:40 PM      Result Value Range   Yeast Wet Prep HPF POC NONE SEEN  NONE SEEN   Trich, Wet Prep NONE SEEN  NONE SEEN   Clue Cells Wet Prep HPF POC NONE SEEN  NONE SEEN   WBC, Wet Prep HPF POC FEW (*) NONE SEEN  FETAL FIBRONECTIN   Collection Time    05/03/13  8:40 PM      Result Value Range   Fetal Fibronectin NEGATIVE  NEGATIVE    Imaging Studies: US Ob Follow Up  04/17/2013   OBSTETRICAL ULTRASOUND: This exam was performed within a [place] Ultrasound Department. The OB US report was generated in the AS system,  and faxed to the ordering physician.   This report is also available in [employer] and in the [employer]. See AS Obstetric US report.  US Fetal Bpp W/o Non Stress  05/03/2013   OBSTETRICAL ULTRASOUND: This exam was performed within a [place] Ultrasound Department. The OB US report was generated in the AS system, and faxed to the ordering physician.   This report is also available in [employer] and in the [employer]. See AS Obstetric US report.  US Fetal Bpp  W/o Non Stress  04/26/2013   OBSTETRICAL ULTRASOUND: This exam was performed within a [place] Ultrasound Department. The OB US report was generated in the AS system, and faxed to the ordering physician.   This report is also available in [employer] and in the [employer]. See AS Obstetric US report.  US Fetal Bpp W/o Non Stress  04/17/2013   OBSTETRICAL ULTRASOUND: This exam was performed within a [place] Ultrasound Department. The OB US report was generated in the AS system, and faxed to the ordering physician.   This report is also available in [employer] and in the [employer]. See AS Obstetric US report.   Melene Muller ON 05/04/2013] docusate sodium  100 mg Oral Daily  . [START ON 05/04/2013] prenatal multivitamin  1 tablet Oral Q1200   I have reviewed the patient's current medications.  ASSESSMENT/PLAN: 22 y.o. [id number] at [redacted]w[redacted]d with pyelonephritis with failed outpatient treatment. - Admit to Antenatal for observation - Rocephin 1 g q 12 hours - FHTs reactive - Monitor for fever, check CBC, sent urine for culture, monitor flank pain - Anticipate discharge tomorrow or next day if patient improves  Napoleon Form, MD

## 2013-05-04 ENCOUNTER — Encounter (HOSPITAL_COMMUNITY): Payer: Self-pay | Admitting: *Deleted

## 2013-05-04 DIAGNOSIS — N1330 Unspecified hydronephrosis: Secondary | ICD-10-CM

## 2013-05-04 DIAGNOSIS — N12 Tubulo-interstitial nephritis, not specified as acute or chronic: Secondary | ICD-10-CM

## 2013-05-04 DIAGNOSIS — N200 Calculus of kidney: Secondary | ICD-10-CM

## 2013-05-04 DIAGNOSIS — O2390 Unspecified genitourinary tract infection in pregnancy, unspecified trimester: Principal | ICD-10-CM

## 2013-05-04 LAB — CBC WITH DIFFERENTIAL/PLATELET
Band Neutrophils: 0 % (ref 0–10)
Eosinophils Absolute: 0.4 10*3/uL (ref 0.0–0.7)
Eosinophils Relative: 2 % (ref 0–5)
HCT: 30.9 % — ABNORMAL LOW (ref 36.0–46.0)
Lymphocytes Relative: 13 % (ref 12–46)
Lymphs Abs: 2.3 10*3/uL (ref 0.7–4.0)
MCV: 75 fL — ABNORMAL LOW (ref 78.0–100.0)
Metamyelocytes Relative: 0 %
Monocytes Absolute: 1.1 10*3/uL — ABNORMAL HIGH (ref 0.1–1.0)
Monocytes Relative: 6 % (ref 3–12)
RBC: 4.12 MIL/uL (ref 3.87–5.11)
WBC: 18 10*3/uL — ABNORMAL HIGH (ref 4.0–10.5)
nRBC: 0 /100 WBC

## 2013-05-04 LAB — GC/CHLAMYDIA PROBE AMP: CT Probe RNA: NEGATIVE

## 2013-05-04 MED ORDER — HYDROCODONE-ACETAMINOPHEN 5-325 MG PO TABS
1.0000 | ORAL_TABLET | ORAL | Status: DC | PRN
  Administered 2013-05-04 (×3): 2 via ORAL
  Filled 2013-05-04 (×3): qty 2

## 2013-05-04 MED ORDER — SODIUM CHLORIDE 0.9 % IV SOLN
INTRAVENOUS | Status: DC
  Administered 2013-05-04 – 2013-05-05 (×2): via INTRAVENOUS

## 2013-05-04 MED ORDER — ONDANSETRON 8 MG PO TBDP
8.0000 mg | ORAL_TABLET | Freq: Two times a day (BID) | ORAL | Status: DC
Start: 2013-05-04 — End: 2013-05-05
  Administered 2013-05-04 – 2013-05-05 (×3): 8 mg via ORAL
  Filled 2013-05-04 (×6): qty 1

## 2013-05-04 MED ORDER — ONDANSETRON 8 MG PO TBDP: 8.0000 mg | ORAL_TABLET | Freq: Once | ORAL | Status: DC

## 2013-05-04 NOTE — Progress Notes (Signed)
UR completed 

## 2013-05-04 NOTE — H&P (Signed)
Attestation of Attending Supervision of Advanced Practitioner: Evaluation and management procedures were performed by the PA/NP/CNM/OB Fellow under my supervision/collaboration. Chart reviewed and agree with management and plan.  Patient will have brief hospitalization due to known Klebsiella uti now with cvat, nonresponsive to Macrodantin tx, and leukocytosis. Will convert to outpt tx upon improvement of CVAT and normalizing of wBC.  Virga Haltiwanger V 05/04/2013 4:45 AM

## 2013-05-04 NOTE — Progress Notes (Signed)
Patient ID: Angel Pratt, female   DOB: 12-24-1990, 24 y.o.   MRN: [id number]  FACULTY PRACTICE ANTEPARTUM COMPREHENSIVE PROGRESS NOTE  Angel Pratt is a 22 y.o. [id number] at [redacted]w[redacted]d  who is admitted for pyelonephritis.  Estimated Date of Delivery: 06/26/13 Fetal presentation is cephalic on ultrasound 7/30.  Length of Stay:  1 Days. 05/03/2013  Subjective: Pt reports pain in right side/back keeping her awake last night. Vicodin lasts about 5 hours but she usually wakes up about 2.5 to 3 hours after dose. No fever. No nausea/vomiting. Patient reports good fetal movement.  She reports no uterine contractions, no bleeding and no loss of fluid per vagina.  Vitals:  Blood pressure 112/83, pulse 86, temperature 98.7 F (37.1 C), temperature source Oral, resp. rate 18, height 5\' 2"  (1.575 m), weight 82.555 kg (182 lb), last menstrual period 09/01/2012.  Physical Examination: GEN:  WNWD, no distress HEENT:  NCAT, EOMI, conjunctiva clear NECK:  Supple, non-tender, no thyromegaly, trachea midline CV: RRR, no murmur RESP:  CTAB ABD:  Tender R CVA/flank/RUQ, no guarding or rebound EXTREM:  Warm, well perfused, no edema or tenderness NEURO:  Alert, oriented, no focal deficits GU:  Deferred   Fetal Monitoring:  Baseline: 140 bpm, Variability: Good {> 6 bpm), Accelerations: Reactive and Decelerations: Absent  Labs:  Results for orders placed during the hospital encounter of 05/03/13 (from the past 24 hour(s))  URINALYSIS, ROUTINE W REFLEX MICROSCOPIC   Collection Time    05/03/13  8:10 PM      Result Value Range   Color, Urine YELLOW  YELLOW   APPearance TURBID (*) CLEAR   Specific Gravity, Urine 1.020  1.005 - 1.030   pH 6.5  5.0 - 8.0   Glucose, UA NEGATIVE  NEGATIVE mg/dL   Hgb urine dipstick MODERATE (*) NEGATIVE   Bilirubin Urine NEGATIVE  NEGATIVE   Ketones, ur NEGATIVE  NEGATIVE mg/dL   Protein, ur 284 (*) NEGATIVE mg/dL   Urobilinogen, UA 0.2  0.0 - 1.0 mg/dL   Nitrite NEGATIVE   NEGATIVE   Leukocytes, UA MODERATE (*) NEGATIVE  URINE MICROSCOPIC-ADD ON   Collection Time    05/03/13  8:10 PM      Result Value Range   Squamous Epithelial / LPF RARE  RARE   WBC, UA 21-50  <3 WBC/hpf   RBC / HPF 7-10  <3 RBC/hpf   Bacteria, UA RARE  RARE  WET PREP, GENITAL   Collection Time    05/03/13  8:40 PM      Result Value Range   Yeast Wet Prep HPF POC NONE SEEN  NONE SEEN   Trich, Wet Prep NONE SEEN  NONE SEEN   Clue Cells Wet Prep HPF POC NONE SEEN  NONE SEEN   WBC, Wet Prep HPF POC FEW (*) NONE SEEN  FETAL FIBRONECTIN   Collection Time    05/03/13  8:40 PM      Result Value Range   Fetal Fibronectin NEGATIVE  NEGATIVE  CBC WITH DIFFERENTIAL   Collection Time    05/03/13 10:30 PM      Result Value Range   WBC 18.0 (*) 4.0 - 10.5 K/uL   RBC 4.12  3.87 - 5.11 MIL/uL   Hemoglobin 10.0 (*) 12.0 - 15.0 g/dL   HCT 13.2 (*) 44.0 - 10.2 %   MCV 75.0 (*) 78.0 - 100.0 fL   MCH 24.3 (*) 26.0 - 34.0 pg   MCHC 32.4  30.0 - 36.0 g/dL  RDW 15.4  11.5 - 15.5 %   Platelets 310  150 - 400 K/uL   Neutrophils Relative % 79 (*) 43 - 77 %   Lymphocytes Relative 13  12 - 46 %   Monocytes Relative 6  3 - 12 %   Eosinophils Relative 2  0 - 5 %   Basophils Relative 0  0 - 1 %   Band Neutrophils 0  0 - 10 %   Metamyelocytes Relative 0     Myelocytes 0     Promyelocytes Absolute 0     Blasts 0     nRBC 0  0 /100 WBC   Neutro Abs 14.2 (*) 1.7 - 7.7 K/uL   Lymphs Abs 2.3  0.7 - 4.0 K/uL   Monocytes Absolute 1.1 (*) 0.1 - 1.0 K/uL   Eosinophils Absolute 0.4  0.0 - 0.7 K/uL   Basophils Absolute 0.0  0.0 - 0.1 K/uL    Imaging Studies:    none  Medications:  Scheduled . cefTRIAXone (ROCEPHIN)  IV  1 g Intravenous Q12H  . docusate sodium  100 mg Oral Daily  . prenatal multivitamin  1 tablet Oral Q1200   I have reviewed the patient's current medications.  ASSESSMENT/PLAN: IUP at [redacted]w[redacted]d - FHTs reactive Pyelonephritis:  Afebrile, leukocytosis, R flank pain, hx Klebsiella UTI  - continue rocephin, f/u cultures and monitor pain/WBC Continue routine antenatal care.  Napoleon Form, MD 05/04/2013 7:23 AM

## 2013-05-05 ENCOUNTER — Inpatient Hospital Stay (HOSPITAL_COMMUNITY): Payer: 59

## 2013-05-05 DIAGNOSIS — O409XX0 Polyhydramnios, unspecified trimester, not applicable or unspecified: Secondary | ICD-10-CM

## 2013-05-05 MED ORDER — FLUCONAZOLE 150 MG PO TABS
150.0000 mg | ORAL_TABLET | Freq: Once | ORAL | Status: AC
  Administered 2013-05-05: 150 mg via ORAL
  Filled 2013-05-05: qty 1

## 2013-05-05 MED ORDER — CEPHALEXIN 500 MG PO CAPS: 500.0000 mg | ORAL_CAPSULE | Freq: Four times a day (QID) | ORAL | Status: DC

## 2013-05-05 MED ORDER — FLUCONAZOLE 150 MG PO TABS: 150.0000 mg | ORAL_TABLET | Freq: Once | ORAL | Status: DC

## 2013-05-05 MED ORDER — HYDROCODONE-ACETAMINOPHEN 5-325 MG PO TABS: 1.0000 | ORAL_TABLET | ORAL | Status: DC | PRN

## 2013-05-05 MED ORDER — CEPHALEXIN 500 MG PO CAPS
500.0000 mg | ORAL_CAPSULE | Freq: Four times a day (QID) | ORAL | Status: DC
  Administered 2013-05-05 (×2): 500 mg via ORAL
  Filled 2013-05-05 (×7): qty 1

## 2013-05-05 NOTE — Progress Notes (Signed)
Dr. Debroah Loop here to discuss Renal US results with pt Pt's questions answered no concerns noted no questions regarding dc noted

## 2013-05-07 NOTE — Discharge Summary (Signed)
Antenatal Physician Discharge Summary  Patient ID: Angel Pratt MRN: [id number] DOB/AGE: 27-Sep-1991 22 y.o.  Admit date: 05/03/2013 Discharge date: 05/07/2013  Admission Diagnoses: Pyelonephritis at [redacted]w[redacted]d  Discharge Diagnoses: Pyelonephritis and nephrolithiasis at [redacted]w[redacted]d  Prenatal Procedures: NST  Significant Diagnostic Studies:  Results for orders placed during the hospital encounter of 05/03/13 (from the past 168 hour(s))  URINALYSIS, ROUTINE W REFLEX MICROSCOPIC   Collection Time    05/03/13  8:10 PM      Result Value Range   Color, Urine YELLOW  YELLOW   APPearance TURBID (*) CLEAR   Specific Gravity, Urine 1.020  1.005 - 1.030   pH 6.5  5.0 - 8.0   Glucose, UA NEGATIVE  NEGATIVE mg/dL   Hgb urine dipstick MODERATE (*) NEGATIVE   Bilirubin Urine NEGATIVE  NEGATIVE   Ketones, ur NEGATIVE  NEGATIVE mg/dL   Protein, ur 409 (*) NEGATIVE mg/dL   Urobilinogen, UA 0.2  0.0 - 1.0 mg/dL   Nitrite NEGATIVE  NEGATIVE   Leukocytes, UA MODERATE (*) NEGATIVE  URINE MICROSCOPIC-ADD ON   Collection Time    05/03/13  8:10 PM      Result Value Range   Squamous Epithelial / LPF RARE  RARE   WBC, UA 21-50  <3 WBC/hpf   RBC / HPF 7-10  <3 RBC/hpf   Bacteria, UA RARE  RARE  WET PREP, GENITAL   Collection Time    05/03/13  8:40 PM      Result Value Range   Yeast Wet Prep HPF POC NONE SEEN  NONE SEEN   Trich, Wet Prep NONE SEEN  NONE SEEN   Clue Cells Wet Prep HPF POC NONE SEEN  NONE SEEN   WBC, Wet Prep HPF POC FEW (*) NONE SEEN  GC/CHLAMYDIA PROBE AMP   Collection Time    05/03/13  8:40 PM      Result Value Range   CT Probe RNA NEGATIVE  NEGATIVE   GC Probe RNA NEGATIVE  NEGATIVE  FETAL FIBRONECTIN   Collection Time    05/03/13  8:40 PM      Result Value Range   Fetal Fibronectin NEGATIVE  NEGATIVE  CBC WITH DIFFERENTIAL   Collection Time    05/03/13 10:30 PM      Result Value Range   WBC 18.0 (*) 4.0 - 10.5 K/uL   RBC 4.12  3.87 - 5.11 MIL/uL   Hemoglobin 10.0 (*) 12.0  - 15.0 g/dL   HCT 81.1 (*) 91.4 - 78.2 %   MCV 75.0 (*) 78.0 - 100.0 fL   MCH 24.3 (*) 26.0 - 34.0 pg   MCHC 32.4  30.0 - 36.0 g/dL   RDW 95.6  21.3 - 08.6 %   Platelets 310  150 - 400 K/uL   Neutrophils Relative % 79 (*) 43 - 77 %   Lymphocytes Relative 13  12 - 46 %   Monocytes Relative 6  3 - 12 %   Eosinophils Relative 2  0 - 5 %   Basophils Relative 0  0 - 1 %   Band Neutrophils 0  0 - 10 %   Metamyelocytes Relative 0     Myelocytes 0     Promyelocytes Absolute 0     Blasts 0     nRBC 0  0 /100 WBC   Neutro Abs 14.2 (*) 1.7 - 7.7 K/uL   Lymphs Abs 2.3  0.7 - 4.0 K/uL   Monocytes Absolute 1.1 (*) 0.1 - 1.0  K/uL   Eosinophils Absolute 0.4  0.0 - 0.7 K/uL   Basophils Absolute 0.0  0.0 - 0.1 K/uL    05/05/2013   RENAL/URINARY TRACT ULTRASOUND COMPLETE  Clinical Data: [redacted] weeks pregnant with recurrent UTI, Evaluate for nephrolithiasis   Comparison:  A few BPP ultrasound 05/03/2013  Findings:  Right Kidney:  Moderate hydronephrosis.  The renal pelvis measures 2.6 cm in width.  There are multiple echogenic foci with color comet-tail artifacts in the upper, interpolar and lower pole regions consistent with nephrolithiasis.  The largest single stone is identified in the lower pole and measures 11 mm in diameter. The kidney measures 12.4 cm in length.  Left Kidney:  No hydronephrosis.  Multiple small echogenic foci with color comet-tail artifact consistent with nephrolithiasis. The largest stone measures 6 mm and is located in the lower pole of the kidney.  The kidney measures 13.8 cm in length.  Bladder:  Unremarkable appearance of the bladder.  The bilateral ureteral jets are identified.  IMPRESSION:  1.  Bilateral nephrolithiasis with multiple stones in both kidneys. The largest stone on the right measures 11 mm in the lower pole and the largest stone on the left measures 6 mm, also in the lower pole. 2.  Moderate right hydronephrosis. 3.  Both ureteral jets are identified in the bladder  suggesting that any obstruction of the right kidney is incomplete.   Original Report Authenticated By: Malachy Moan, M.D.   Treatments: IV hydration, Rocephin, Vicodin  Hospital Course:  This is a 22 y.o. [id number] with IUP at [redacted]w[redacted]d admitted for pyelonephritis.  She was admitted previously for a Klebsiella pyelonephritis from 03/14/13  - 03/16/13; she was initially treated with Rocephin and was on discharged on Keflex followed by [facility] for suppression.  Her symptoms recurred and worsened; she re-presented to MAU on 05/03/13.  She was re-admitted and treated again with Rocephin which ameliorated her symptoms.  A renal ultrasound did show bilateral nephrolithiasis that was non obstructing as bilateral ureteral jets were visualized; please refer to report above for more details.   Patient was also treated for vaginal candidiasis with oral Diflucan.  After 48 hours, her pain had improved significantly and she was deemed stable for discharge to home.  During her admission, the fetal heart rate monitoring remained reassuring, and she had no signs/symptoms of preterm labor or other maternal-fetal concerns.   She was again discharged on Keflex 500 mg po qid x 12 days; then Keflex 500 mg po bid for suppression for the rest of the pregnancy. She was strongly advised to take the medications as prescribed, and to return to MAU for any further concerns/worsening symptoms.     Of note, patient also has gestational hypertension, which was not an active problem during this admission as her BPs were stable and she had no concerning symptoms.   She had started twice weekly testing given that she was > [redacted] weeks gestation.  05/03/13 BPP was 8/8, AFI was 20.95 cm.  Patient will get NSTs during her weekly Tuesday [facility], and BPP at [facility] on Fridays until delivery.  Her next growth ultrasound will be done concurrently with her BPP on 05/12/13.  Discharge Exam: BP 117/68  Pulse 89   Temp(Src) 98.1 F (36.7 C) (Oral)  Resp 20  Ht 5\' 2"  (1.575 m)  Wt 182 lb (82.555 kg)  BMI 33.28 kg/m2  LMP 09/01/2012  Fetal Monitoring: Baseline: 140 bpm, Variability: Moderate {> 6 bpm),  Accelerations: Reactive and Decelerations: Absent.  No contractions. GEN: WNWD, no distress  HEENT: NCAT, EOMI, conjunctiva clear  NECK: Supple, non-tender, no thyromegaly, trachea midline  CV: RRR, no murmur  RESP: CTAB  ABD: soft, gravid, no guarding or rebound  BACK: Minimal R CVAT, no L CVAT EXTREM: Warm, well perfused, no edema or tenderness  NEURO: Alert, oriented, no focal deficits  PELVIC: Deferred  Discharge Condition:  Stable  Disposition: 01-Home or Self Care   Future Appointments Provider Department Dept Phone   05/09/2013 8:45 AM Reva Bores, MD Center for [facility] [phone]   05/12/2013 8:30 AM [facility] [place] [facility]TR[facility] [phone]   05/19/2013 9:30 AM [facility] [place] [facility] [phone]   05/19/2013 10:45 AM [facility] [phone]   05/26/2013 10:30 AM [facility] [place] [facility] [phone]   05/26/2013 10:45 AM [facility] [phone]       Medication List    STOP taking these medications       nitrofurantoin (macrocrystal-monohydrate) 100 MG capsule  Commonly known as:  MACROBID      TAKE these medications       cephALEXin 500 MG capsule  Commonly known as:  KEFLEX  Take 1 capsule (500 mg total) by mouth 4 (four) times daily. For 12 days; then twice a day for rest of pregnancy     fluconazole 150 MG tablet  Commonly known as:  DIFLUCAN  Take 1 tablet (150 mg total) by mouth once. Can repeat another dose 3 days later if needed     HYDROcodone-acetaminophen 5-325 MG per tablet  Commonly known as:  NORCO/VICODIN  Take 1-2 tablets by mouth every 4 (four) hours as needed.      loratadine 10 MG tablet  Commonly known as:  CLARITIN  Take 10 mg by mouth daily.     vitamin B-12 50 MCG tablet  Commonly known as:  CYANOCOBALAMIN  Take 50 mcg by mouth every other day.           Follow-up Information   Follow up with Center for [facility] On 05/09/2013.   Contact information:   [address] [place] [address] [phone]      Follow up with [facility] On 05/12/2013. (8:30 am for ultrasound)    Contact information:   [address] [phone] [place] [address] [phone]      Signed: [name] A M.D. 05/07/2013, 11:19 AM

## 2013-05-08 ENCOUNTER — Ambulatory Visit (HOSPITAL_COMMUNITY): Payer: 59

## 2013-05-09 ENCOUNTER — Encounter: Payer: Self-pay | Admitting: Family Medicine

## 2013-05-09 ENCOUNTER — Ambulatory Visit (INDEPENDENT_AMBULATORY_CARE_PROVIDER_SITE_OTHER): Payer: 59 | Admitting: Family Medicine

## 2013-05-09 VITALS — BP 116/87 | Wt 184.0 lb

## 2013-05-09 DIAGNOSIS — O359XX0 Maternal care for (suspected) fetal abnormality and damage, unspecified, not applicable or unspecified: Secondary | ICD-10-CM

## 2013-05-09 DIAGNOSIS — O133 Gestational [pregnancy-induced] hypertension without significant proteinuria, third trimester: Secondary | ICD-10-CM

## 2013-05-09 DIAGNOSIS — N200 Calculus of kidney: Secondary | ICD-10-CM | POA: Insufficient documentation

## 2013-05-09 DIAGNOSIS — O359XX1 Maternal care for (suspected) fetal abnormality and damage, unspecified, fetus 1: Secondary | ICD-10-CM

## 2013-05-09 DIAGNOSIS — O139 Gestational [pregnancy-induced] hypertension without significant proteinuria, unspecified trimester: Secondary | ICD-10-CM

## 2013-05-09 DIAGNOSIS — Z3480 Encounter for supervision of other normal pregnancy, unspecified trimester: Secondary | ICD-10-CM

## 2013-05-09 DIAGNOSIS — N10 Acute tubulo-interstitial nephritis: Secondary | ICD-10-CM

## 2013-05-09 DIAGNOSIS — O2390 Unspecified genitourinary tract infection in pregnancy, unspecified trimester: Secondary | ICD-10-CM

## 2013-05-09 DIAGNOSIS — O2302 Infections of kidney in pregnancy, second trimester: Secondary | ICD-10-CM

## 2013-05-09 NOTE — Progress Notes (Signed)
Discussed diagnoses--including kidney stones, second course of pyelo this pregnancy--to finish abx and start suppression--reviewed risks of kidney stones. Poly + GHTN=2 x/wk testing and BP is great today--seeing MFM for second testing. NST reviewed and reactive.

## 2013-05-09 NOTE — Progress Notes (Signed)
Patient seen in MAU for kidney stones in both kidneys over the weekend.

## 2013-05-09 NOTE — Patient Instructions (Signed)
Preeclampsia and Eclampsia Preeclampsia is a condition of high blood pressure during pregnancy. It can happen at 20 weeks or later in pregnancy. If high blood pressure occurs in the second half of pregnancy with no other symptoms, it is called gestational hypertension and goes away after the baby is born. If any of the symptoms listed below develop with gestational hypertension, it is then called preeclampsia. Eclampsia (convulsions) may follow preeclampsia. This is one of the reasons for regular prenatal checkups. Early diagnosis and treatment are very important to prevent eclampsia. CAUSES  There is no known cause of preeclampsia/eclampsia in pregnancy. There are several known conditions that may put the pregnant woman at risk, such as:  The first pregnancy.  Having preeclampsia in a past pregnancy.  Having lasting (chronic) high blood pressure.  Having multiples (twins, triplets).  Being age 24 or older.  African American ethnic background.  Having kidney disease or diabetes.  Medical conditions such as lupus or blood diseases.  Being overweight (obese). SYMPTOMS   High blood pressure.  Headaches.  Sudden weight gain.  Swelling of hands, face, legs, and feet.  Protein in the urine.  Feeling sick to your stomach (nauseous) and throwing up (vomiting).  Vision problems (blurred or double vision).  Numbness in the face, arms, legs, and feet.  Dizziness.  Slurred speech.  Preeclampsia can cause growth retardation in the fetus.  Separation (abruption) of the placenta.  Not enough fluid in the amniotic sac (oligohydramnios).  Sensitivity to bright lights.  Belly (abdominal) pain. DIAGNOSIS  If protein is found in the urine in the second half of pregnancy, this is considered preeclampsia. Other symptoms mentioned above may also be present. TREATMENT  It is necessary to treat this.  Your caregiver may prescribe bed rest early in this condition. Plenty of rest and  salt restriction may be all that is needed.  Medicines may be necessary to lower blood pressure if the condition does not respond to more conservative measures.  In more severe cases, hospitalization may be needed:  For treatment of blood pressure.  To control fluid retention.  To monitor the baby to see if the condition is causing harm to the baby.  Hospitalization is the best way to treat the first sign of preeclampsia. This is so the mother and baby can be watched closely and blood tests can be done effectively and correctly.  If the condition becomes severe, it may be necessary to induce labor or to remove the infant by surgical means (cesarean section). The best cure for preeclampsia/eclampsia is to deliver the baby. Preeclampsia and eclampsia involve risks to mother and infant. Your caregiver will discuss these risks with you. Together, you can work out the best possible approach to your problems. Make sure you keep your prenatal visits as scheduled. Not keeping appointments could result in a chronic or permanent injury, pain, disability to you, and death or injury to you or your unborn baby. If there is any problem keeping the appointment, you must call to reschedule. HOME CARE INSTRUCTIONS   Keep your prenatal appointments and tests as scheduled.  Tell your caregiver if you have any of the above risk factors.  Get plenty of rest and sleep.  Eat a balanced diet that is low in salt, and do not add salt to your food.  Avoid stressful situations.  Only take over-the-counter and prescriptions medicines for pain, discomfort, or fever as directed by your caregiver. SEEK IMMEDIATE MEDICAL CARE IF:   You develop severe swelling  anywhere in the body. This usually occurs in the legs.  You gain 5 lb/2.3 kg or more in a week.  You develop a severe headache, dizziness, problems with your vision, or confusion.  You have abdominal pain, nausea, or vomiting.  You have a seizure.  You  have trouble moving any part of your body, or you develop numbness or problems speaking.  You have bruising or abnormal bleeding from anywhere in the body.  You develop a stiff neck.  You pass out. MAKE SURE YOU:   Understand these instructions.  Will watch your condition.  Will get help right away if you are not doing well or get worse. Document Released: 09/18/2000 Document Revised: 12/14/2011 Document Reviewed: 05/04/2008 [facility] Patient Information 2014 [place], [place].  Breastfeeding A change in hormones during your pregnancy causes growth of your breast tissue and an increase in number and size of milk ducts. The hormone prolactin allows proteins, sugars, and fats from your blood supply to make breast milk in your milk-producing glands. The hormone progesterone prevents breast milk from being released before the birth of your baby. After the birth of your baby, your progesterone level decreases allowing breast milk to be released. Thoughts of your baby, as well as his or her sucking or crying, can stimulate the release of milk from the milk-producing glands. Deciding to breastfeed (nurse) is one of the best choices you can make for you and your baby. The information that follows gives a brief review of the benefits, as well as other important skills to know about breastfeeding. BENEFITS OF BREASTFEEDING For your baby  The first milk (colostrum) helps your baby's digestive system function better.   There are antibodies in your milk that help your baby fight off infections.   Your baby has a lower incidence of asthma, allergies, and sudden infant death syndrome (SIDS).   The nutrients in breast milk are better for your baby than infant formulas.  Breast milk improves your baby's brain development.   Your baby will have less gas, colic, and constipation.  Your baby is less likely to develop other conditions, such as childhood obesity, asthma, or diabetes mellitus. For  you  Breastfeeding helps develop a very special bond between you and your baby.   Breastfeeding is convenient, always available at the correct temperature, and costs nothing.   Breastfeeding helps to burn calories and helps you lose the weight gained during pregnancy.   Breastfeeding makes your uterus contract back down to normal size faster and slows bleeding following delivery.   Breastfeeding mothers have a lower risk of developing osteoporosis or breast or ovarian cancer later in life.  BREASTFEEDING FREQUENCY  A healthy, full-term baby may breastfeed as often as every hour or space his or her feedings to every 3 hours. Breastfeeding frequency will vary from baby to baby.   Newborns should be fed no less than every 2 3 hours during the day and every 4 5 hours during the night. You should breastfeed a minimum of 8 feedings in a 24 hour period.  Awaken your baby to breastfeed if it has been 3 4 hours since the last feeding.  Breastfeed when you feel the need to reduce the fullness of your breasts or when your newborn shows signs of hunger. Signs that your baby may be hungry include:  Increased alertness or activity.  Stretching.  Movement of the head from side to side.  Movement of the head and opening of the mouth when the corner of  the mouth or cheek is stroked (rooting).  Increased sucking sounds, smacking lips, cooing, sighing, or squeaking.  Hand-to-mouth movements.  Increased sucking of fingers or hands.  Fussing.  Intermittent crying.  Signs of extreme hunger will require calming and consoling before you try to feed your baby. Signs of extreme hunger may include:  Restlessness.  A loud, strong cry.  Screaming.  Frequent feeding will help you make more milk and will help prevent problems, such as sore nipples and engorgement of the breasts.  BREASTFEEDING   Whether lying down or sitting, be sure that the baby's abdomen is facing your abdomen.    Support your breast with 4 fingers under your breast and your thumb above your nipple. Make sure your fingers are well away from your nipple and your baby's mouth.   Stroke your baby's lips gently with your finger or nipple.   When your baby's mouth is open wide enough, place all of your nipple and as much of the colored area around your nipple (areola) as possible into your baby's mouth.  More areola should be visible above his or her upper lip than below his or her lower lip.  Your baby's tongue should be between his or her lower gum and your breast.  Ensure that your baby's mouth is correctly positioned around the nipple (latched). Your baby's lips should create a seal on your breast.  Signs that your baby has effectively latched onto your nipple include:  Tugging or sucking without pain.  Swallowing heard between sucks.  Absent click or smacking sound.  Muscle movement above and in front of his or her ears with sucking.  Your baby must suck about 2 3 minutes in order to get your milk. Allow your baby to feed on each breast as long as he or she wants. Nurse your baby until he or she unlatches or falls asleep at the first breast, then offer the second breast.  Signs that your baby is full and satisfied include:  A gradual decrease in the number of sucks or complete cessation of sucking.  Falling asleep.  Extension or relaxation of his or her body.  Retention of a small amount of milk in his or her mouth.  Letting go of your breast by himself or herself.  Signs of effective breastfeeding in you include:  Breasts that have increased firmness, weight, and size prior to feeding.  Breasts that are softer after nursing.  Increased milk volume, as well as a change in milk consistency and color by the 5th day of breastfeeding.  Breast fullness relieved by breastfeeding.  Nipples are not sore, cracked, or bleeding.  If needed, break the suction by putting your finger  into the corner of your baby's mouth and sliding your finger between his or her gums. Then, remove your breast from his or her mouth.  It is common for babies to spit up a small amount after a feeding.  Babies often swallow air during feeding. This can make babies fussy. Burping your baby between breasts can help with this.  Vitamin D supplements are recommended for babies who get only breast milk.  Avoid using a pacifier during your baby's first 4 6 weeks.  Avoid supplemental feedings of water, formula, or juice in place of breastfeeding. Breast milk is all the food your baby needs. It is not necessary for your baby to have water or formula. Your breasts will make more milk if supplemental feedings are avoided during the early weeks. HOW TO  TELL WHETHER YOUR BABY IS GETTING ENOUGH BREAST MILK Wondering whether or not your baby is getting enough milk is a common concern among mothers. You can be assured that your baby is getting enough milk if:   Your baby is actively sucking and you hear swallowing.   Your baby seems relaxed and satisfied after a feeding.   Your baby nurses at least 8 12 times in a 24 hour time period.  During the first 61 35 days of age:  Your baby is wetting at least 3 5 diapers in a 24 hour period. The urine should be clear and pale yellow.  Your baby is having at least 3 4 stools in a 24 hour period. The stool should be soft and yellow.  At 40 42 days of age, your baby is having at least 3 6 stools in a 24 hour period. The stool should be seedy and yellow by 55 days of age.  Your baby has a weight loss less than 7 10% during the first 41 days of age.  Your baby does not lose weight after 18 76 days of age.  Your baby gains 4 7 ounces each week after he or she is 34 days of age.  Your baby gains weight by 83 days of age and is back to birth weight within 2 weeks. ENGORGEMENT In the first week after your baby is born, you may experience extremely full breasts  (engorgement). When engorged, your breasts may feel heavy, warm, or tender to the touch. Engorgement peaks within 24 48 hours after delivery of your baby.  Engorgement may be reduced by:  Continuing to breastfeed.  Increasing the frequency of breastfeeding.  Taking warm showers or applying warm, moist heat to your breasts just before each feeding. This increases circulation and helps the milk flow.   Gently massaging your breast before and during the feedings. With your fingertips, massage from your chest wall towards your nipple in a circular motion.   Ensuring that your baby empties at least one breast at every feeding. It also helps to start the next feeding on the opposite breast.   Expressing breast milk by hand or by using a breast pump to empty the breasts if your baby is sleepy, or not nursing well. You may also want to express milk if you are returning to work oryou feel you are getting engorged.  Ensuring your baby is latched on and positioned properly while breastfeeding. If you follow these suggestions, your engorgement should improve in 24 48 hours. If you are still experiencing difficulty, call your lactation consultant or caregiver.  CARING FOR YOURSELF Take care of your breasts.  Bathe or shower daily.   Avoid using soap on your nipples.   Wear a supportive bra. Avoid wearing underwire style bras.  Air dry your nipples for a 3 after each feeding.   Use only cotton bra pads to absorb breast milk leakage. Leaking of breast milk between feedings is normal.   Use only pure lanolin on your nipples after nursing. You do not need to wash it off before feeding your baby again. Another option is to express a few drops of breast milk and gently massage that milk into your nipples.  Continue breast self-awareness checks. Take care of yourself.  Eat healthy foods. Alternate 3 meals with 3 snacks.  Avoid foods that you notice affect your baby in a bad  way.  Drink milk, fruit juice, and water to satisfy your thirst (about 8  glasses a day).   Rest often, relax, and take your prenatal vitamins to prevent fatigue, stress, and anemia.  Avoid chewing and smoking tobacco.  Avoid alcohol and drug use.  Take over-the-counter and prescribed medicine only as directed by your caregiver or pharmacist. You should always check with your caregiver or pharmacist before taking any new medicine, vitamin, or herbal supplement.  Know that pregnancy is possible while breastfeeding. If desired, talk to your caregiver about family planning and safe birth control methods that may be used while breastfeeding. SEEK MEDICAL CARE IF:   You feel like you want to stop breastfeeding or have become frustrated with breastfeeding.  You have painful breasts or nipples.  Your nipples are cracked or bleeding.  Your breasts are red, tender, or warm.  You have a swollen area on either breast.  You have a fever or chills.  You have nausea or vomiting.  You have drainage from your nipples.  Your breasts do not become full before feedings by the 5th day after delivery.  You feel sad and depressed.  Your baby is too sleepy to eat well.  Your baby is having trouble sleeping.   Your baby is wetting less than 3 diapers in a 24 hour period.  Your baby has less than 3 stools in a 24 hour period.  Your baby's skin or the white part of his or her eyes becomes more yellow.   Your baby is not gaining weight by 59 days of age. MAKE SURE YOU:   Understand these instructions.  Will watch your condition.  Will get help right away if you are not doing well or get worse. Document Released: 09/21/2005 Document Revised: 06/15/2012 Document Reviewed: 04/27/2012 [facility] Patient Information 2014 [place], [place].

## 2013-05-12 ENCOUNTER — Ambulatory Visit (HOSPITAL_COMMUNITY)
Admit: 2013-05-12 | Discharge: 2013-05-12 | Disposition: A | Discharge status: Home / Self Care | Payer: 59 | Attending: Obstetrics & Gynecology | Admitting: Obstetrics & Gynecology

## 2013-05-12 DIAGNOSIS — O3660X0 Maternal care for excessive fetal growth, unspecified trimester, not applicable or unspecified: Secondary | ICD-10-CM | POA: Insufficient documentation

## 2013-05-12 DIAGNOSIS — O133 Gestational [pregnancy-induced] hypertension without significant proteinuria, third trimester: Secondary | ICD-10-CM

## 2013-05-12 DIAGNOSIS — O358XX0 Maternal care for other (suspected) fetal abnormality and damage, not applicable or unspecified: Secondary | ICD-10-CM | POA: Insufficient documentation

## 2013-05-12 DIAGNOSIS — O09299 Supervision of pregnancy with other poor reproductive or obstetric history, unspecified trimester: Secondary | ICD-10-CM | POA: Insufficient documentation

## 2013-05-12 DIAGNOSIS — O2620 Pregnancy care for patient with recurrent pregnancy loss, unspecified trimester: Secondary | ICD-10-CM | POA: Insufficient documentation

## 2013-05-12 DIAGNOSIS — O139 Gestational [pregnancy-induced] hypertension without significant proteinuria, unspecified trimester: Secondary | ICD-10-CM | POA: Insufficient documentation

## 2013-05-15 ENCOUNTER — Ambulatory Visit (INDEPENDENT_AMBULATORY_CARE_PROVIDER_SITE_OTHER): Payer: 59 | Admitting: Family Medicine

## 2013-05-15 VITALS — BP 121/97 | Wt 186.0 lb

## 2013-05-15 DIAGNOSIS — O133 Gestational [pregnancy-induced] hypertension without significant proteinuria, third trimester: Secondary | ICD-10-CM

## 2013-05-15 DIAGNOSIS — O139 Gestational [pregnancy-induced] hypertension without significant proteinuria, unspecified trimester: Secondary | ICD-10-CM

## 2013-05-15 DIAGNOSIS — Z3480 Encounter for supervision of other normal pregnancy, unspecified trimester: Secondary | ICD-10-CM

## 2013-05-15 NOTE — Progress Notes (Signed)
U/S in MFM for growth on 8/7--nml growth 77%. Per pt. For delivery at 37 wks due to [facility]. Continue 2x/wk testing. BP 140/85 last night and headache--now resolved. NST reviewed and reactive.

## 2013-05-15 NOTE — Progress Notes (Signed)
[id number] 

## 2013-05-15 NOTE — Patient Instructions (Signed)
Preeclampsia and Eclampsia Preeclampsia is a condition of high blood pressure during pregnancy. It can happen at 20 weeks or later in pregnancy. If high blood pressure occurs in the second half of pregnancy with no other symptoms, it is called gestational hypertension and goes away after the baby is born. If any of the symptoms listed below develop with gestational hypertension, it is then called preeclampsia. Eclampsia (convulsions) may follow preeclampsia. This is one of the reasons for regular prenatal checkups. Early diagnosis and treatment are very important to prevent eclampsia. CAUSES  There is no known cause of preeclampsia/eclampsia in pregnancy. There are several known conditions that may put the pregnant woman at risk, such as:  The first pregnancy.  Having preeclampsia in a past pregnancy.  Having lasting (chronic) high blood pressure.  Having multiples (twins, triplets).  Being age 35 or older.  African American ethnic background.  Having kidney disease or diabetes.  Medical conditions such as lupus or blood diseases.  Being overweight (obese). SYMPTOMS   High blood pressure.  Headaches.  Sudden weight gain.  Swelling of hands, face, legs, and feet.  Protein in the urine.  Feeling sick to your stomach (nauseous) and throwing up (vomiting).  Vision problems (blurred or double vision).  Numbness in the face, arms, legs, and feet.  Dizziness.  Slurred speech.  Preeclampsia can cause growth retardation in the fetus.  Separation (abruption) of the placenta.  Not enough fluid in the amniotic sac (oligohydramnios).  Sensitivity to bright lights.  Belly (abdominal) pain. DIAGNOSIS  If protein is found in the urine in the second half of pregnancy, this is considered preeclampsia. Other symptoms mentioned above may also be present. TREATMENT  It is necessary to treat this.  Your caregiver may prescribe bed rest early in this condition. Plenty of rest and  salt restriction may be all that is needed.  Medicines may be necessary to lower blood pressure if the condition does not respond to more conservative measures.  In more severe cases, hospitalization may be needed:  For treatment of blood pressure.  To control fluid retention.  To monitor the baby to see if the condition is causing harm to the baby.  Hospitalization is the best way to treat the first sign of preeclampsia. This is so the mother and baby can be watched closely and blood tests can be done effectively and correctly.  If the condition becomes severe, it may be necessary to induce labor or to remove the infant by surgical means (cesarean section). The best cure for preeclampsia/eclampsia is to deliver the baby. Preeclampsia and eclampsia involve risks to mother and infant. Your caregiver will discuss these risks with you. Together, you can work out the best possible approach to your problems. Make sure you keep your prenatal visits as scheduled. Not keeping appointments could result in a chronic or permanent injury, pain, disability to you, and death or injury to you or your unborn baby. If there is any problem keeping the appointment, you must call to reschedule. HOME CARE INSTRUCTIONS   Keep your prenatal appointments and tests as scheduled.  Tell your caregiver if you have any of the above risk factors.  Get plenty of rest and sleep.  Eat a balanced diet that is low in salt, and do not add salt to your food.  Avoid stressful situations.  Only take over-the-counter and prescriptions medicines for pain, discomfort, or fever as directed by your caregiver. SEEK IMMEDIATE MEDICAL CARE IF:   You develop severe swelling   anywhere in the body. This usually occurs in the legs.  You gain 5 lb/2.3 kg or more in a week.  You develop a severe headache, dizziness, problems with your vision, or confusion.  You have abdominal pain, nausea, or vomiting.  You have a seizure.  You  have trouble moving any part of your body, or you develop numbness or problems speaking.  You have bruising or abnormal bleeding from anywhere in the body.  You develop a stiff neck.  You pass out. MAKE SURE YOU:   Understand these instructions.  Will watch your condition.  Will get help right away if you are not doing well or get worse. Document Released: 09/18/2000 Document Revised: 12/14/2011 Document Reviewed: 05/04/2008 [employer] Patient Information 2014 [employer].  Breastfeeding A change in hormones during your pregnancy causes growth of your breast tissue and an increase in number and size of milk ducts. The hormone prolactin allows proteins, sugars, and fats from your blood supply to make breast milk in your milk-producing glands. The hormone progesterone prevents breast milk from being released before the birth of your baby. After the birth of your baby, your progesterone level decreases allowing breast milk to be released. Thoughts of your baby, as well as his or her sucking or crying, can stimulate the release of milk from the milk-producing glands. Deciding to breastfeed (nurse) is one of the best choices you can make for you and your baby. The information that follows gives a brief review of the benefits, as well as other important skills to know about breastfeeding. BENEFITS OF BREASTFEEDING For your baby  The first milk (colostrum) helps your baby's digestive system function better.   There are antibodies in your milk that help your baby fight off infections.   Your baby has a lower incidence of asthma, allergies, and sudden infant death syndrome (SIDS).   The nutrients in breast milk are better for your baby than infant formulas.  Breast milk improves your baby's brain development.   Your baby will have less gas, colic, and constipation.  Your baby is less likely to develop other conditions, such as childhood obesity, asthma, or diabetes mellitus. For  you  Breastfeeding helps develop a very special bond between you and your baby.   Breastfeeding is convenient, always available at the correct temperature, and costs nothing.   Breastfeeding helps to burn calories and helps you lose the weight gained during pregnancy.   Breastfeeding makes your uterus contract back down to normal size faster and slows bleeding following delivery.   Breastfeeding mothers have a lower risk of developing osteoporosis or breast or ovarian cancer later in life.  BREASTFEEDING FREQUENCY  A healthy, full-term baby may breastfeed as often as every hour or space his or her feedings to every 3 hours. Breastfeeding frequency will vary from baby to baby.   Newborns should be fed no less than every 2 3 hours during the day and every 4 5 hours during the night. You should breastfeed a minimum of 8 feedings in a 24 hour period.  Awaken your baby to breastfeed if it has been 3 4 hours since the last feeding.  Breastfeed when you feel the need to reduce the fullness of your breasts or when your newborn shows signs of hunger. Signs that your baby may be hungry include:  Increased alertness or activity.  Stretching.  Movement of the head from side to side.  Movement of the head and opening of the mouth when the corner of   the mouth or cheek is stroked (rooting).  Increased sucking sounds, smacking lips, cooing, sighing, or squeaking.  Hand-to-mouth movements.  Increased sucking of fingers or hands.  Fussing.  Intermittent crying.  Signs of extreme hunger will require calming and consoling before you try to feed your baby. Signs of extreme hunger may include:  Restlessness.  A loud, strong cry.  Screaming.  Frequent feeding will help you make more milk and will help prevent problems, such as sore nipples and engorgement of the breasts.  BREASTFEEDING   Whether lying down or sitting, be sure that the baby's abdomen is facing your abdomen.    Support your breast with 4 fingers under your breast and your thumb above your nipple. Make sure your fingers are well away from your nipple and your baby's mouth.   Stroke your baby's lips gently with your finger or nipple.   When your baby's mouth is open wide enough, place all of your nipple and as much of the colored area around your nipple (areola) as possible into your baby's mouth.  More areola should be visible above his or her upper lip than below his or her lower lip.  Your baby's tongue should be between his or her lower gum and your breast.  Ensure that your baby's mouth is correctly positioned around the nipple (latched). Your baby's lips should create a seal on your breast.  Signs that your baby has effectively latched onto your nipple include:  Tugging or sucking without pain.  Swallowing heard between sucks.  Absent click or smacking sound.  Muscle movement above and in front of his or her ears with sucking.  Your baby must suck about 2 3 minutes in order to get your milk. Allow your baby to feed on each breast as long as he or she wants. Nurse your baby until he or she unlatches or falls asleep at the first breast, then offer the second breast.  Signs that your baby is full and satisfied include:  A gradual decrease in the number of sucks or complete cessation of sucking.  Falling asleep.  Extension or relaxation of his or her body.  Retention of a small amount of milk in his or her mouth.  Letting go of your breast by himself or herself.  Signs of effective breastfeeding in you include:  Breasts that have increased firmness, weight, and size prior to feeding.  Breasts that are softer after nursing.  Increased milk volume, as well as a change in milk consistency and color by the 5th day of breastfeeding.  Breast fullness relieved by breastfeeding.  Nipples are not sore, cracked, or bleeding.  If needed, break the suction by putting your finger  into the corner of your baby's mouth and sliding your finger between his or her gums. Then, remove your breast from his or her mouth.  It is common for babies to spit up a small amount after a feeding.  Babies often swallow air during feeding. This can make babies fussy. Burping your baby between breasts can help with this.  Vitamin D supplements are recommended for babies who get only breast milk.  Avoid using a pacifier during your baby's first 4 6 weeks.  Avoid supplemental feedings of water, formula, or juice in place of breastfeeding. Breast milk is all the food your baby needs. It is not necessary for your baby to have water or formula. Your breasts will make more milk if supplemental feedings are avoided during the early weeks. HOW TO   TELL WHETHER YOUR BABY IS GETTING ENOUGH BREAST MILK Wondering whether or not your baby is getting enough milk is a common concern among mothers. You can be assured that your baby is getting enough milk if:   Your baby is actively sucking and you hear swallowing.   Your baby seems relaxed and satisfied after a feeding.   Your baby nurses at least 8 12 times in a 24 hour time period.  During the first 3 5 days of age:  Your baby is wetting at least 3 5 diapers in a 24 hour period. The urine should be clear and pale yellow.  Your baby is having at least 3 4 stools in a 24 hour period. The stool should be soft and yellow.  At 5 7 days of age, your baby is having at least 3 6 stools in a 24 hour period. The stool should be seedy and yellow by 5 days of age.  Your baby has a weight loss less than 7 10% during the first 3 days of age.  Your baby does not lose weight after 3 7 days of age.  Your baby gains 4 7 ounces each week after he or she is 4 days of age.  Your baby gains weight by 5 days of age and is back to birth weight within 2 weeks. ENGORGEMENT In the first week after your baby is born, you may experience extremely full breasts  (engorgement). When engorged, your breasts may feel heavy, warm, or tender to the touch. Engorgement peaks within 24 48 hours after delivery of your baby.  Engorgement may be reduced by:  Continuing to breastfeed.  Increasing the frequency of breastfeeding.  Taking warm showers or applying warm, moist heat to your breasts just before each feeding. This increases circulation and helps the milk flow.   Gently massaging your breast before and during the feedings. With your fingertips, massage from your chest wall towards your nipple in a circular motion.   Ensuring that your baby empties at least one breast at every feeding. It also helps to start the next feeding on the opposite breast.   Expressing breast milk by hand or by using a breast pump to empty the breasts if your baby is sleepy, or not nursing well. You may also want to express milk if you are returning to work oryou feel you are getting engorged.  Ensuring your baby is latched on and positioned properly while breastfeeding. If you follow these suggestions, your engorgement should improve in 24 48 hours. If you are still experiencing difficulty, call your lactation consultant or caregiver.  CARING FOR YOURSELF Take care of your breasts.  Bathe or shower daily.   Avoid using soap on your nipples.   Wear a supportive bra. Avoid wearing underwire style bras.  Air dry your nipples for a 3 4minutes after each feeding.   Use only cotton bra pads to absorb breast milk leakage. Leaking of breast milk between feedings is normal.   Use only pure lanolin on your nipples after nursing. You do not need to wash it off before feeding your baby again. Another option is to express a few drops of breast milk and gently massage that milk into your nipples.  Continue breast self-awareness checks. Take care of yourself.  Eat healthy foods. Alternate 3 meals with 3 snacks.  Avoid foods that you notice affect your baby in a bad  way.  Drink milk, fruit juice, and water to satisfy your thirst (about 8   glasses a day).   Rest often, relax, and take your prenatal vitamins to prevent fatigue, stress, and anemia.  Avoid chewing and smoking tobacco.  Avoid alcohol and drug use.  Take over-the-counter and prescribed medicine only as directed by your caregiver or pharmacist. You should always check with your caregiver or pharmacist before taking any new medicine, vitamin, or herbal supplement.  Know that pregnancy is possible while breastfeeding. If desired, talk to your caregiver about family planning and safe birth control methods that may be used while breastfeeding. SEEK MEDICAL CARE IF:   You feel like you want to stop breastfeeding or have become frustrated with breastfeeding.  You have painful breasts or nipples.  Your nipples are cracked or bleeding.  Your breasts are red, tender, or warm.  You have a swollen area on either breast.  You have a fever or chills.  You have nausea or vomiting.  You have drainage from your nipples.  Your breasts do not become full before feedings by the 5th day after delivery.  You feel sad and depressed.  Your baby is too sleepy to eat well.  Your baby is having trouble sleeping.   Your baby is wetting less than 3 diapers in a 24 hour period.  Your baby has less than 3 stools in a 24 hour period.  Your baby's skin or the white part of his or her eyes becomes more yellow.   Your baby is not gaining weight by 5 days of age. MAKE SURE YOU:   Understand these instructions.  Will watch your condition.  Will get help right away if you are not doing well or get worse. Document Released: 09/21/2005 Document Revised: 06/15/2012 Document Reviewed: 04/27/2012 [employer] Patient Information 2014 [employer].  

## 2013-05-19 ENCOUNTER — Ambulatory Visit (HOSPITAL_COMMUNITY)
Admit: 2013-05-19 | Discharge: 2013-05-19 | Disposition: A | Discharge status: Home / Self Care | Payer: 59 | Attending: Obstetrics & Gynecology | Admitting: Obstetrics & Gynecology

## 2013-05-19 ENCOUNTER — Other Ambulatory Visit (HOSPITAL_COMMUNITY): Payer: Self-pay | Admitting: Maternal and Fetal Medicine

## 2013-05-19 VITALS — BP 122/83 | Wt 184.5 lb

## 2013-05-19 DIAGNOSIS — O358XX0 Maternal care for other (suspected) fetal abnormality and damage, not applicable or unspecified: Secondary | ICD-10-CM | POA: Insufficient documentation

## 2013-05-19 DIAGNOSIS — O139 Gestational [pregnancy-induced] hypertension without significant proteinuria, unspecified trimester: Secondary | ICD-10-CM | POA: Insufficient documentation

## 2013-05-19 DIAGNOSIS — O09299 Supervision of pregnancy with other poor reproductive or obstetric history, unspecified trimester: Secondary | ICD-10-CM | POA: Insufficient documentation

## 2013-05-19 DIAGNOSIS — O133 Gestational [pregnancy-induced] hypertension without significant proteinuria, third trimester: Secondary | ICD-10-CM

## 2013-05-19 DIAGNOSIS — O2620 Pregnancy care for patient with recurrent pregnancy loss, unspecified trimester: Secondary | ICD-10-CM | POA: Insufficient documentation

## 2013-05-19 DIAGNOSIS — O3660X0 Maternal care for excessive fetal growth, unspecified trimester, not applicable or unspecified: Secondary | ICD-10-CM | POA: Insufficient documentation

## 2013-05-22 ENCOUNTER — Ambulatory Visit (INDEPENDENT_AMBULATORY_CARE_PROVIDER_SITE_OTHER): Payer: 59 | Admitting: Obstetrics & Gynecology

## 2013-05-22 VITALS — BP 117/85 | Wt 186.0 lb

## 2013-05-22 DIAGNOSIS — O133 Gestational [pregnancy-induced] hypertension without significant proteinuria, third trimester: Secondary | ICD-10-CM

## 2013-05-22 DIAGNOSIS — O139 Gestational [pregnancy-induced] hypertension without significant proteinuria, unspecified trimester: Secondary | ICD-10-CM

## 2013-05-22 DIAGNOSIS — Z3480 Encounter for supervision of other normal pregnancy, unspecified trimester: Secondary | ICD-10-CM

## 2013-05-22 NOTE — Progress Notes (Signed)
[id number] 

## 2013-05-22 NOTE — Patient Instructions (Signed)

## 2013-05-22 NOTE — Progress Notes (Signed)
Complains of headaches not alleviated with Tylenol since headaches, no scotomata but describes blurry vision in the night and epigastric pain at night. BP normal today, normal reflexes, will check labs. NST today is reactive, will continue twice a week testing.  IOL scheduled at [redacted]w[redacted]d  (06/06/13 7:30pm)as per patient's preference instead of [redacted]w[redacted]d.  She will come in on [redacted]w[redacted]d for NST here at [facility].  She understands she may have to be delivered earlier if there is any concern about maternal-fetal status or if she starts spontaneous labor.  Preeclampsia, fetal movement and labor precautions reviewed. Pelvic cultures next week.

## 2013-05-23 LAB — CBC
HCT: 32.8 % — ABNORMAL LOW (ref 36.0–46.0)
Platelets: 334 10*3/uL (ref 150–400)
RDW: 17 % — ABNORMAL HIGH (ref 11.5–15.5)
WBC: 12.2 10*3/uL — ABNORMAL HIGH (ref 4.0–10.5)

## 2013-05-23 LAB — COMPREHENSIVE METABOLIC PANEL
ALT: <8 U/L (ref 0–35)
AST: 9 U/L (ref 0–37)
Albumin: 3.3 g/dL — ABNORMAL LOW (ref 3.5–5.2)
Calcium: 9.4 mg/dL (ref 8.4–10.5)
Chloride: 106 mEq/L (ref 96–112)
Potassium: 4.1 mEq/L (ref 3.5–5.3)

## 2013-05-26 ENCOUNTER — Other Ambulatory Visit (HOSPITAL_COMMUNITY): Payer: Self-pay | Admitting: Maternal and Fetal Medicine

## 2013-05-26 ENCOUNTER — Ambulatory Visit (HOSPITAL_COMMUNITY)
Admit: 2013-05-26 | Discharge: 2013-05-26 | Disposition: A | Discharge status: Home / Self Care | Payer: 59 | Attending: Obstetrics and Gynecology | Admitting: Obstetrics and Gynecology

## 2013-05-26 ENCOUNTER — Ambulatory Visit (HOSPITAL_COMMUNITY): Admit: 2013-05-26 | Payer: 59

## 2013-05-26 DIAGNOSIS — O09299 Supervision of pregnancy with other poor reproductive or obstetric history, unspecified trimester: Secondary | ICD-10-CM | POA: Insufficient documentation

## 2013-05-26 DIAGNOSIS — O358XX0 Maternal care for other (suspected) fetal abnormality and damage, not applicable or unspecified: Secondary | ICD-10-CM | POA: Insufficient documentation

## 2013-05-26 DIAGNOSIS — O3660X0 Maternal care for excessive fetal growth, unspecified trimester, not applicable or unspecified: Secondary | ICD-10-CM | POA: Insufficient documentation

## 2013-05-26 DIAGNOSIS — O139 Gestational [pregnancy-induced] hypertension without significant proteinuria, unspecified trimester: Secondary | ICD-10-CM | POA: Insufficient documentation

## 2013-05-26 DIAGNOSIS — O133 Gestational [pregnancy-induced] hypertension without significant proteinuria, third trimester: Secondary | ICD-10-CM

## 2013-05-26 DIAGNOSIS — O2620 Pregnancy care for patient with recurrent pregnancy loss, unspecified trimester: Secondary | ICD-10-CM | POA: Insufficient documentation

## 2013-05-26 NOTE — Progress Notes (Signed)
Angel Pratt  was seen today for an ultrasound appointment.  See full report in [facility].  Impression: IUP at 35+4 weeks Resolution of abdominal/pelvic cyst, gestational vs. chronic hypertension Normal amniotic fluid volume   BPP 8/8  BP 111/76  Recommendations: Continue weekly BPPs  Tentatively scheduled for induction of labor at 39 weeks  Alpha Gula, MD

## 2013-05-29 ENCOUNTER — Encounter: Payer: Self-pay | Admitting: Obstetrics and Gynecology

## 2013-05-29 ENCOUNTER — Ambulatory Visit (INDEPENDENT_AMBULATORY_CARE_PROVIDER_SITE_OTHER): Payer: 59 | Admitting: Obstetrics and Gynecology

## 2013-05-29 VITALS — BP 122/94 | Wt 190.0 lb

## 2013-05-29 DIAGNOSIS — O0990 Supervision of high risk pregnancy, unspecified, unspecified trimester: Secondary | ICD-10-CM

## 2013-05-29 DIAGNOSIS — O403XX0 Polyhydramnios, third trimester, not applicable or unspecified: Secondary | ICD-10-CM

## 2013-05-29 DIAGNOSIS — O360131 Maternal care for anti-D [Rh] antibodies, third trimester, fetus 1: Secondary | ICD-10-CM

## 2013-05-29 DIAGNOSIS — O09293 Supervision of pregnancy with other poor reproductive or obstetric history, third trimester: Secondary | ICD-10-CM

## 2013-05-29 DIAGNOSIS — O2302 Infections of kidney in pregnancy, second trimester: Secondary | ICD-10-CM

## 2013-05-29 DIAGNOSIS — N10 Acute tubulo-interstitial nephritis: Secondary | ICD-10-CM

## 2013-05-29 DIAGNOSIS — O133 Gestational [pregnancy-induced] hypertension without significant proteinuria, third trimester: Secondary | ICD-10-CM

## 2013-05-29 DIAGNOSIS — O139 Gestational [pregnancy-induced] hypertension without significant proteinuria, unspecified trimester: Secondary | ICD-10-CM

## 2013-05-29 DIAGNOSIS — O2390 Unspecified genitourinary tract infection in pregnancy, unspecified trimester: Secondary | ICD-10-CM

## 2013-05-29 DIAGNOSIS — O09299 Supervision of pregnancy with other poor reproductive or obstetric history, unspecified trimester: Secondary | ICD-10-CM

## 2013-05-29 DIAGNOSIS — O0993 Supervision of high risk pregnancy, unspecified, third trimester: Secondary | ICD-10-CM

## 2013-05-29 DIAGNOSIS — K8020 Calculus of gallbladder without cholecystitis without obstruction: Secondary | ICD-10-CM

## 2013-05-29 DIAGNOSIS — O409XX0 Polyhydramnios, unspecified trimester, not applicable or unspecified: Secondary | ICD-10-CM

## 2013-05-29 DIAGNOSIS — O360990 Maternal care for other rhesus isoimmunization, unspecified trimester, not applicable or unspecified: Secondary | ICD-10-CM

## 2013-05-29 LAB — OB RESULTS CONSOLE GC/CHLAMYDIA: Gonorrhea: NEGATIVE

## 2013-05-29 LAB — OB RESULTS CONSOLE GBS: GBS: NEGATIVE

## 2013-05-29 NOTE — Progress Notes (Signed)
NST reviewed and reactive. FM/labor/preeclampsia precautions reviewed. Cultures collected today. Patient scheduled for NST and MFM scan on 8/29. IOL 9/2

## 2013-05-29 NOTE — Progress Notes (Signed)
P = 98 

## 2013-05-30 ENCOUNTER — Other Ambulatory Visit: Payer: Self-pay | Admitting: Obstetrics & Gynecology

## 2013-05-30 DIAGNOSIS — O403XX1 Polyhydramnios, third trimester, fetus 1: Secondary | ICD-10-CM

## 2013-05-31 ENCOUNTER — Telehealth (HOSPITAL_COMMUNITY): Payer: Self-pay | Admitting: *Deleted

## 2013-05-31 NOTE — Telephone Encounter (Signed)
Preadmission screen  

## 2013-06-01 ENCOUNTER — Encounter (HOSPITAL_COMMUNITY): Payer: Self-pay | Admitting: *Deleted

## 2013-06-01 ENCOUNTER — Telehealth (HOSPITAL_COMMUNITY): Payer: Self-pay | Admitting: *Deleted

## 2013-06-01 NOTE — Telephone Encounter (Signed)
Preadmission screen  

## 2013-06-02 ENCOUNTER — Ambulatory Visit (HOSPITAL_COMMUNITY)
Admission: RE | Admit: 2013-06-02 | Discharge: 2013-06-02 | Disposition: A | Discharge status: Home / Self Care | Payer: 59 | Source: Ambulatory Visit | Attending: Obstetrics & Gynecology | Admitting: Obstetrics & Gynecology

## 2013-06-02 DIAGNOSIS — O2620 Pregnancy care for patient with recurrent pregnancy loss, unspecified trimester: Secondary | ICD-10-CM | POA: Insufficient documentation

## 2013-06-02 DIAGNOSIS — O139 Gestational [pregnancy-induced] hypertension without significant proteinuria, unspecified trimester: Secondary | ICD-10-CM | POA: Insufficient documentation

## 2013-06-02 DIAGNOSIS — O3660X0 Maternal care for excessive fetal growth, unspecified trimester, not applicable or unspecified: Secondary | ICD-10-CM | POA: Insufficient documentation

## 2013-06-02 DIAGNOSIS — O358XX0 Maternal care for other (suspected) fetal abnormality and damage, not applicable or unspecified: Secondary | ICD-10-CM | POA: Insufficient documentation

## 2013-06-02 DIAGNOSIS — O403XX1 Polyhydramnios, third trimester, fetus 1: Secondary | ICD-10-CM

## 2013-06-02 DIAGNOSIS — O09299 Supervision of pregnancy with other poor reproductive or obstetric history, unspecified trimester: Secondary | ICD-10-CM | POA: Insufficient documentation

## 2013-06-02 NOTE — Progress Notes (Signed)
Angel Pratt  was seen today for an ultrasound appointment.  See full report in [facility].  Impression: IUP at 36+4 weeks Resolution of abdominal/pelvic cyst, gestational vs. chronic hypertension Normal amniotic fluid volume (AFI 12.6 cm) Reactive NST - normal modified BPP  Recommendations: Follow-up ultrasounds as clinically indicated.   Alpha Gula, MD

## 2013-06-03 ENCOUNTER — Encounter: Payer: Self-pay | Admitting: Obstetrics and Gynecology

## 2013-06-06 ENCOUNTER — Encounter (HOSPITAL_COMMUNITY): Payer: Self-pay

## 2013-06-06 ENCOUNTER — Inpatient Hospital Stay (HOSPITAL_COMMUNITY)
Admission: RE | Admit: 2013-06-06 | Discharge: 2013-06-09 | DRG: 372 | Disposition: A | Discharge status: Home / Self Care | Payer: BC Managed Care – PPO | Source: Ambulatory Visit | Attending: Obstetrics & Gynecology | Admitting: Obstetrics & Gynecology

## 2013-06-06 VITALS — BP 112/74 | HR 84 | Temp 97.9°F | Resp 18 | Ht 63.0 in | Wt 191.0 lb

## 2013-06-06 DIAGNOSIS — O139 Gestational [pregnancy-induced] hypertension without significant proteinuria, unspecified trimester: Principal | ICD-10-CM | POA: Diagnosis present

## 2013-06-06 DIAGNOSIS — N200 Calculus of kidney: Secondary | ICD-10-CM | POA: Diagnosis present

## 2013-06-06 DIAGNOSIS — O26899 Other specified pregnancy related conditions, unspecified trimester: Secondary | ICD-10-CM | POA: Diagnosis present

## 2013-06-06 DIAGNOSIS — O403XX0 Polyhydramnios, third trimester, not applicable or unspecified: Secondary | ICD-10-CM

## 2013-06-06 DIAGNOSIS — K8020 Calculus of gallbladder without cholecystitis without obstruction: Secondary | ICD-10-CM | POA: Diagnosis present

## 2013-06-06 DIAGNOSIS — O0993 Supervision of high risk pregnancy, unspecified, third trimester: Secondary | ICD-10-CM

## 2013-06-06 DIAGNOSIS — O2302 Infections of kidney in pregnancy, second trimester: Secondary | ICD-10-CM

## 2013-06-06 DIAGNOSIS — N10 Acute tubulo-interstitial nephritis: Secondary | ICD-10-CM

## 2013-06-06 LAB — URINALYSIS, ROUTINE W REFLEX MICROSCOPIC
Bilirubin Urine: NEGATIVE
Glucose, UA: NEGATIVE mg/dL
Protein, ur: NEGATIVE mg/dL
Urobilinogen, UA: 0.2 mg/dL (ref 0.0–1.0)

## 2013-06-06 LAB — CBC
Hemoglobin: 10.5 g/dL — ABNORMAL LOW (ref 12.0–15.0)
Platelets: 300 10*3/uL (ref 150–400)
RBC: 4.5 MIL/uL (ref 3.87–5.11)
WBC: 13.6 10*3/uL — ABNORMAL HIGH (ref 4.0–10.5)

## 2013-06-06 MED ORDER — ONDANSETRON HCL 4 MG/2ML IJ SOLN: 4.0000 mg | Freq: Four times a day (QID) | INTRAMUSCULAR | Status: DC | PRN

## 2013-06-06 MED ORDER — LACTATED RINGERS IV SOLN: 500.0000 mL | INTRAVENOUS | Status: DC | PRN

## 2013-06-06 MED ORDER — ACETAMINOPHEN 325 MG PO TABS: 650.0000 mg | ORAL_TABLET | ORAL | Status: DC | PRN

## 2013-06-06 MED ORDER — OXYTOCIN 40 UNITS IN LACTATED RINGERS INFUSION - SIMPLE MED
1.0000 m[IU]/min | INTRAVENOUS | Status: DC
  Administered 2013-06-07: 666 m[IU]/min via INTRAVENOUS
  Administered 2013-06-07: 2 m[IU]/min via INTRAVENOUS

## 2013-06-06 MED ORDER — LIDOCAINE HCL (PF) 1 % IJ SOLN
30.0000 mL | INTRAMUSCULAR | Status: DC | PRN
  Filled 2013-06-06 (×2): qty 30

## 2013-06-06 MED ORDER — TERBUTALINE SULFATE 1 MG/ML IJ SOLN: 0.2500 mg | Freq: Once | INTRAMUSCULAR | Status: AC | PRN

## 2013-06-06 MED ORDER — OXYTOCIN BOLUS FROM INFUSION: 500.0000 mL | INTRAVENOUS | Status: DC

## 2013-06-06 MED ORDER — LACTATED RINGERS IV SOLN
INTRAVENOUS | Status: DC
  Administered 2013-06-06 – 2013-06-07 (×3): via INTRAVENOUS

## 2013-06-06 MED ORDER — OXYTOCIN 40 UNITS IN LACTATED RINGERS INFUSION - SIMPLE MED
62.5000 mL/h | INTRAVENOUS | Status: DC
  Filled 2013-06-06: qty 1000

## 2013-06-06 MED ORDER — CITRIC ACID-SODIUM CITRATE 334-500 MG/5ML PO SOLN: 30.0000 mL | ORAL | Status: DC | PRN

## 2013-06-06 MED ORDER — FLEET ENEMA 7-19 GM/118ML RE ENEM: 1.0000 | ENEMA | RECTAL | Status: DC | PRN

## 2013-06-06 MED ORDER — IBUPROFEN 600 MG PO TABS: 600.0000 mg | ORAL_TABLET | Freq: Four times a day (QID) | ORAL | Status: DC | PRN

## 2013-06-06 NOTE — H&P (Signed)
Angel Pratt is a 22 y.o. female [phone] currently 37wk2d as determined by 8 week [place] presenting for scheduled IOL. Patient states she was scheduled for IOL 3 weeks ago by Dr. Macon Large due to her history of severe preeclampsia in the prior pregnancy at 38 weeks, CHTN, vs gest HTN w/ episodes of RUQ pain, HA's and blurry vision.  Patient Active Problem List   Diagnosis Date Noted  . Nephrolithiasis 05/09/2013  . Polyhydramnios in third trimester, antepartum complication, resolved 04/26/2013  . Fetal pelvic cyst, antepartum 04/17/2013  . Gestational hypertension 04/11/2013  . Pyelonephritis 03/16/2013  . Hydronephrosis, right 03/16/2013  . Acute pyelonephritis in second trimester, antepartum 03/14/2013  . Supervision of high-risk pregnancy 11/15/2012  . Hx of preeclampsia, prior pregnancy, currently pregnant 11/15/2012  . Cholelithiasis   . Rh negative state in antepartum period 11/01/2012    Genetic Screen  First screen-Nml NT and NB, AFP neg  Anatomic [place]  NML  Glucose Screen 123  GBS neg  Feeding Preference Breast  Contraception Vasectomy  Circumcision Girl - N/A    Maternal Medical History:  Reason for admission: Contractions.      Contractions: Frequency: irregular.   Duration is approximately 5 minutes.   Perceived severity is mild.   3 weeks ago, denies any worsening since the onset  Fetal activity: Perceived fetal activity is normal.   Last perceived fetal movement was within the past hour.    Prenatal complications: Cholelithiasis, PIH, nephrolithiasis and polyhydramnios.   No bleeding, pre-eclampsia or substance abuse.   Pyelonephritis and kidney stones requiring hospitalization in June 2014  Prenatal Complications - Diabetes: none.    OB History   Grav Para Term Preterm Abortions TAB SAB Ect Mult Living   5 1 1  3  3   1      Past Medical History  Diagnosis Date  . Preeclampsia   . Pregnancy induced hypertension   . Pyelonephritis complicating  pregnancy 03/2013  . Anemia   . Ovarian cyst   . Kidney stones    Past Surgical History  Procedure Laterality Date  . Tonsillectomy      2009  . Dilation and curettage of uterus     Family History: family history includes Cancer in her maternal aunt and mother; Cancer (age of onset: 73) in her maternal grandmother; Diabetes in her maternal grandfather; Ovarian cysts in her maternal grandmother and mother. Social History:  reports that she has never smoked. She has never used smokeless tobacco. She reports that she does not drink alcohol or use illicit drugs.  Review of Systems  Constitutional: Negative for fever and chills.  Eyes: Negative for blurred vision (previous, not currently).  Respiratory: Negative for shortness of breath.   Cardiovascular: Negative for chest pain and leg swelling.  Gastrointestinal: Positive for abdominal pain (RUQ pain). Negative for vomiting, diarrhea and constipation.  Genitourinary: Positive for frequency. Negative for dysuria, hematuria and flank pain.  Neurological: Negative for dizziness and headaches (previous, not currently).  Dilation: 1 Effacement (%): Thick Station: -3 Exam by:: C Merchant navy officer Blood pressure 116/87, pulse 118, temperature 98.3 F (36.8 C), temperature source Oral, resp. rate 18, height 5\' 3"  (1.6 m), weight 86.637 kg (191 lb), last menstrual period 09/01/2012. Maternal Exam:  Uterine Assessment: Contraction strength is mild.  Contraction frequency is rare.   Abdomen: Patient reports no abdominal tenderness. Estimated fetal weight is S=D.   Fetal presentation: vertex  Introitus: Normal vulva. Normal vagina.  Pelvis: adequate for delivery.  Cervix: Cervix evaluated by digital exam.     Fetal Exam Fetal Monitor Review: Mode: ultrasound.   Baseline rate: 140.  Variability: moderate (6-25 bpm).   Pattern: accelerations present and no decelerations.    Fetal State Assessment: Category I - tracings are  normal.     Physical Exam  Constitutional: She is oriented to person, place, and time. She appears well-developed and well-nourished. No distress.  HENT:  Head: Normocephalic.  Eyes: Conjunctivae are normal.  Cardiovascular: Normal rate, regular rhythm, normal heart sounds and intact distal pulses.   Respiratory: Effort normal and breath sounds normal. No respiratory distress.  GI: Bowel sounds are normal.  Gravid   Neurological: She is alert and oriented to person, place, and time.  Skin: Skin is warm and dry.  Psychiatric: She has a normal mood and affect.    Prenatal labs: ABO, Rh: O/NEG/-- (01/27 1108) Antibody: NEG (01/27 1108) Rubella: 13.40 (01/27 1108) RPR: NON REAC (06/19 1133)  HBsAg: NEGATIVE (01/27 1108)  HIV: NON REACTIVE (06/19 1133)  GBS: Negative (08/25 0000)  Genetic Screen  First screen-Nml NT and NB, AFP neg  Anatomic [place]  NML  Glucose Screen 123  GBS neg   Assessment: 1. Labor: IOL 2. Fetal Wellbeing: Category I  3. Pain Control: none 4. GBS: neg 5. 37.1 week IUP 6. Symptomatic gest HTN vs CHTN.  Plan:  1. Admit to BS per consult with MD.  2. Routine L&D orders 3. Analgesia/anesthesia PRN  4. Plan Foley induction.  Coy Saunas 06/06/2013, 9:57 PM  I was present for the exam and agree with above.  Pelzer, CNM 06/07/2013 3:03 AM

## 2013-06-06 NOTE — Progress Notes (Signed)
Angel Pratt is a 22 y.o. G5P1031 at [redacted]w[redacted]d.   Subjective: Denies UC's. Occasional pelvic pressure.  Objective: BP 123/74  Pulse 89  Temp(Src) 98.1 F (36.7 C) (Oral)  Resp 18  Ht 5\' 3"  (1.6 m)  Wt 86.637 kg (191 lb)  BMI 33.84 kg/m2  LMP 09/01/2012      FHT:  FHR: 130-140 bpm, variability: moderate,  accelerations:  Present,  decelerations:  Present two prolonged decels lasting 2-3 minutes. Resolved spontaneously.  UC:   none SVE:   Dilation: 3 Effacement (%): 50 Station: -2 Exam by:: Ivonne Andrew CNM  Labs: Lab Results  Component Value Date   WBC 12.2* 05/22/2013   HGB 10.3* 05/22/2013   HCT 32.8* 05/22/2013   MCV 74.5* 05/22/2013   PLT 334 05/22/2013    Assessment / Plan: Induction of labor due to gestational hypertension.  Labor: Start pitocin. Preeclampsia:  BP stable. RUQ pain pressent. PIH and P:C ratio ordered.  Fetal Wellbeing:  Category II Pain Control:  Labor support without medications I/D:  n/a Anticipated MOD:  NSVD  Beacher Every 06/06/2013, 11:51 PM

## 2013-06-07 ENCOUNTER — Inpatient Hospital Stay (HOSPITAL_COMMUNITY): Payer: BC Managed Care – PPO | Admitting: Anesthesiology

## 2013-06-07 ENCOUNTER — Encounter (HOSPITAL_COMMUNITY): Payer: Self-pay

## 2013-06-07 ENCOUNTER — Encounter (HOSPITAL_COMMUNITY): Payer: Self-pay | Admitting: Anesthesiology

## 2013-06-07 DIAGNOSIS — O26899 Other specified pregnancy related conditions, unspecified trimester: Secondary | ICD-10-CM

## 2013-06-07 DIAGNOSIS — N200 Calculus of kidney: Secondary | ICD-10-CM

## 2013-06-07 DIAGNOSIS — O139 Gestational [pregnancy-induced] hypertension without significant proteinuria, unspecified trimester: Secondary | ICD-10-CM

## 2013-06-07 LAB — COMPREHENSIVE METABOLIC PANEL
ALT: 7 U/L (ref 0–35)
AST: 10 U/L (ref 0–37)
Albumin: 2.7 g/dL — ABNORMAL LOW (ref 3.5–5.2)
CO2: 20 mEq/L (ref 19–32)
Chloride: 100 mEq/L (ref 96–112)
GFR calc non Af Amer: >90 mL/min (ref >90)
Potassium: 3.6 mEq/L (ref 3.5–5.1)
Sodium: 134 mEq/L — ABNORMAL LOW (ref 135–145)
Total Bilirubin: 0.2 mg/dL — ABNORMAL LOW (ref 0.3–1.2)

## 2013-06-07 LAB — PROTEIN / CREATININE RATIO, URINE
Creatinine, Urine: 118.63 mg/dL
Total Protein, Urine: 17.2 mg/dL

## 2013-06-07 LAB — RPR: RPR Ser Ql: NONREACTIVE

## 2013-06-07 MED ORDER — LACTATED RINGERS IV SOLN
500.0000 mL | Freq: Once | INTRAVENOUS | Status: AC
  Administered 2013-06-07: 500 mL via INTRAVENOUS

## 2013-06-07 MED ORDER — EPHEDRINE 5 MG/ML INJ
10.0000 mg | INTRAVENOUS | Status: DC | PRN
  Filled 2013-06-07: qty 2
  Filled 2013-06-07: qty 4

## 2013-06-07 MED ORDER — FERROUS SULFATE 325 (65 FE) MG PO TABS
325.0000 mg | ORAL_TABLET | Freq: Two times a day (BID) | ORAL | Status: DC
  Administered 2013-06-07 – 2013-06-09 (×4): 325 mg via ORAL
  Filled 2013-06-07 (×4): qty 1

## 2013-06-07 MED ORDER — LIDOCAINE HCL (PF) 1 % IJ SOLN
INTRAMUSCULAR | Status: DC | PRN
  Administered 2013-06-07 (×4): 4 mL

## 2013-06-07 MED ORDER — ONDANSETRON HCL 4 MG PO TABS: 4.0000 mg | ORAL_TABLET | ORAL | Status: DC | PRN

## 2013-06-07 MED ORDER — DIPHENHYDRAMINE HCL 25 MG PO CAPS: 25.0000 mg | ORAL_CAPSULE | Freq: Four times a day (QID) | ORAL | Status: DC | PRN

## 2013-06-07 MED ORDER — SENNOSIDES-DOCUSATE SODIUM 8.6-50 MG PO TABS
2.0000 | ORAL_TABLET | Freq: Every day | ORAL | Status: DC
  Administered 2013-06-07: 2 via ORAL

## 2013-06-07 MED ORDER — ONDANSETRON HCL 4 MG/2ML IJ SOLN: 4.0000 mg | INTRAMUSCULAR | Status: DC | PRN

## 2013-06-07 MED ORDER — WITCH HAZEL-GLYCERIN EX PADS: 1.0000 "application " | MEDICATED_PAD | CUTANEOUS | Status: DC | PRN

## 2013-06-07 MED ORDER — TETANUS-DIPHTH-ACELL PERTUSSIS 5-2.5-18.5 LF-MCG/0.5 IM SUSP: 0.5000 mL | Freq: Once | INTRAMUSCULAR | Status: DC

## 2013-06-07 MED ORDER — IBUPROFEN 600 MG PO TABS
600.0000 mg | ORAL_TABLET | Freq: Four times a day (QID) | ORAL | Status: DC
  Administered 2013-06-07 – 2013-06-09 (×9): 600 mg via ORAL
  Filled 2013-06-07 (×9): qty 1

## 2013-06-07 MED ORDER — LANOLIN HYDROUS EX OINT: 1.0000 "application " | TOPICAL_OINTMENT | CUTANEOUS | Status: DC | PRN

## 2013-06-07 MED ORDER — HYDROCODONE-ACETAMINOPHEN 5-325 MG PO TABS: 1.0000 | ORAL_TABLET | ORAL | Status: DC | PRN

## 2013-06-07 MED ORDER — DIPHENHYDRAMINE HCL 50 MG/ML IJ SOLN: 12.5000 mg | INTRAMUSCULAR | Status: DC | PRN

## 2013-06-07 MED ORDER — PHENYLEPHRINE 40 MCG/ML (10ML) SYRINGE FOR IV PUSH (FOR BLOOD PRESSURE SUPPORT)
80.0000 ug | PREFILLED_SYRINGE | INTRAVENOUS | Status: DC | PRN
  Filled 2013-06-07: qty 2

## 2013-06-07 MED ORDER — SIMETHICONE 80 MG PO CHEW: 80.0000 mg | CHEWABLE_TABLET | ORAL | Status: DC | PRN

## 2013-06-07 MED ORDER — DIBUCAINE 1 % RE OINT: 1.0000 "application " | TOPICAL_OINTMENT | RECTAL | Status: DC | PRN

## 2013-06-07 MED ORDER — PRENATAL MULTIVITAMIN CH
1.0000 | ORAL_TABLET | Freq: Every day | ORAL | Status: DC
  Administered 2013-06-07 – 2013-06-09 (×3): 1 via ORAL
  Filled 2013-06-07 (×3): qty 1

## 2013-06-07 MED ORDER — BENZOCAINE-MENTHOL 20-0.5 % EX AERO
1.0000 "application " | INHALATION_SPRAY | CUTANEOUS | Status: DC | PRN
  Administered 2013-06-07: 1 via TOPICAL
  Filled 2013-06-07: qty 56

## 2013-06-07 MED ORDER — ZOLPIDEM TARTRATE 5 MG PO TABS: 5.0000 mg | ORAL_TABLET | Freq: Every evening | ORAL | Status: DC | PRN

## 2013-06-07 MED ORDER — EPHEDRINE 5 MG/ML INJ
10.0000 mg | INTRAVENOUS | Status: DC | PRN
  Filled 2013-06-07: qty 2

## 2013-06-07 MED ORDER — MEASLES, MUMPS & RUBELLA VAC ~~LOC~~ INJ
0.5000 mL | INJECTION | Freq: Once | SUBCUTANEOUS | Status: DC
  Filled 2013-06-07: qty 0.5

## 2013-06-07 MED ORDER — MAGNESIUM HYDROXIDE 400 MG/5ML PO SUSP: 30.0000 mL | ORAL | Status: DC | PRN

## 2013-06-07 MED ORDER — FENTANYL 2.5 MCG/ML BUPIVACAINE 1/10 % EPIDURAL INFUSION (WH - ANES)
14.0000 mL/h | INTRAMUSCULAR | Status: DC | PRN
  Administered 2013-06-07: 14 mL/h via EPIDURAL
  Filled 2013-06-07: qty 125

## 2013-06-07 MED ORDER — PHENYLEPHRINE 40 MCG/ML (10ML) SYRINGE FOR IV PUSH (FOR BLOOD PRESSURE SUPPORT)
80.0000 ug | PREFILLED_SYRINGE | INTRAVENOUS | Status: DC | PRN
  Filled 2013-06-07: qty 2
  Filled 2013-06-07: qty 5

## 2013-06-07 NOTE — Progress Notes (Signed)
Angel Pratt is a 22 y.o. [phone] at [redacted]w[redacted]d.   Subjective: Mild urge to push w/ UC's  Objective: BP 102/63  Pulse 79  Temp(Src) 98.7 F (37.1 C) (Oral)  Resp 18  Ht 5\' 3"  (1.6 m)  Wt 86.637 kg (191 lb)  BMI 33.84 kg/m2  SpO2 98%  LMP 09/01/2012 Patient Vitals for the past 24 hrs:  BP Temp Temp src Pulse Resp SpO2 Height Weight  06/07/13 0733 102/63 mmHg - - 79 - - - -  06/07/13 0701 98/54 mmHg - - 83 18 - - -  06/07/13 0631 96/61 mmHg - - 69 18 - - -  06/07/13 0601 95/54 mmHg - - 81 18 - - -  06/07/13 0531 115/69 mmHg - - 85 18 - - -  06/07/13 0506 118/79 mmHg - - 87 18 - - -  06/07/13 0502 - - - 89 - 98 % - -  06/07/13 0501 116/75 mmHg - - 94 18 - - -  06/07/13 0457 - - - 85 - 98 % - -  06/07/13 0456 116/85 mmHg - - 94 18 - - -  06/07/13 0452 - - - 69 - 99 % - -  06/07/13 0451 115/70 mmHg - - 80 18 - - -  06/07/13 0447 - - - 79 - 96 % - -  06/07/13 0446 121/78 mmHg - - 86 18 - - -  06/07/13 0442 - - - 79 - 99 % - -  06/07/13 0441 122/81 mmHg - - 82 18 - - -  06/07/13 0436 120/85 mmHg - - 68 18 99 % - -  06/07/13 0434 123/88 mmHg - - 80 18 - - -  06/07/13 0432 - - - 86 - 99 % - -  06/07/13 0430 123/77 mmHg - - 65 18 99 % - -  06/07/13 0429 124/88 mmHg - - 58 18 99 % - -  06/07/13 0406 132/99 mmHg 98.7 F (37.1 C) Oral 81 18 - - -     FHT:  FHR: 130 bpm, variability: moderate,  accelerations:  Present,  decelerations:  Absent UC:   regular, every 3 minutes SVE:   Dilation: 10 Effacement (%): 100 Station: 0 Exam by:: Dorathy Kinsman, cnm  Labs: Lab Results  Component Value Date   WBC 13.6* 06/06/2013   HGB 10.5* 06/06/2013   HCT 32.8* 06/06/2013   MCV 72.9* 06/06/2013   PLT 300 06/06/2013    Assessment / Plan: Induction of labor due to gestational hypertension,  progressing well on pitocin  Labor: Progressing normally Preeclampsia:  labs stable Fetal Wellbeing:  Category I Pain Control:  Epidural I/D:  n/a Anticipated MOD:  NSVD  Angel Pratt 06/07/2013,  7:44 AM

## 2013-06-07 NOTE — Lactation Note (Signed)
This note was copied from the chart of Angel Pratt. Lactation Consultation Note   Initial consult with this mom and baby, now 9 hours post partum.  Mom reports breast feeding is going very well, and on exam, mom has lots of easy to express colostrum. The baby has already stooled and voided( good amount as per mom). I reviewed breast feeding teachigngwith mom from Bynum and me book. Baby is getting bathed now, and I will check back later to observe latch. Mom knows to call for questions/concerns.  Patient Name: Angel Ladiamond Gallina ZOXWR'U Date: 06/07/2013 Reason for consult: Initial assessment   Maternal Data Formula Feeding for Exclusion: No Infant to breast within first hour of birth: Yes Has patient been taught Hand Expression?: Yes Does the patient have breastfeeding experience prior to this delivery?: Yes  Feeding Feeding Type: Breast Milk Length of feed: 5 min  LATCH Score/Interventions       Type of Nipple: Inverted (right nipple inverted - evert w DEP)              Lactation Tools Discussed/Used Tools: Shells (mom has but noit wearing ) Date initiated:: 06/07/13   Consult Status Consult Status: Follow-up Date: 06/08/13 Follow-up type: In-patient    Alfred Levins 06/07/2013, 5:32 PM

## 2013-06-07 NOTE — Anesthesia Postprocedure Evaluation (Signed)
  Anesthesia Post-op Note  Patient: Angel Pratt  Procedure(s) Performed: * No procedures listed *  Patient Location: PACU and Mother/Baby  Anesthesia Type:Epidural  Level of Consciousness: awake, alert  and oriented  Airway and Oxygen Therapy: Patient Spontanous Breathing  Post-op Pain: none  Post-op Assessment: Post-op Vital signs reviewed, Patient's Cardiovascular Status Stable, No headache, No backache, No residual numbness and No residual motor weakness  Post-op Vital Signs: Reviewed and stable  Complications: No apparent anesthesia complications

## 2013-06-07 NOTE — Progress Notes (Signed)
Angel Pratt is a 22 y.o. [phone] at [redacted]w[redacted]d  Subjective: Patient reports increased frequency of contractions with minimal increase in severity. Denies any other symptoms at this time.   Objective: BP 105/69  Pulse 97  Temp(Src) 98.1 F (36.7 C) (Oral)  Resp 18  Ht 5\' 3"  (1.6 m)  Wt 86.637 kg (191 lb)  BMI 33.84 kg/m2  LMP 09/01/2012      FHT:  FHR: 130 bpm, variability: moderate,  accelerations:  Present,  decelerations:  Absent UC:   irregular, every 1.5 minutes SVE:   Dilation: 3 Effacement (%): 50 Station: -2 Exam by:: Ivonne Andrew CNM  Labs: Lab Results  Component Value Date   WBC 13.6* 06/06/2013   HGB 10.5* 06/06/2013   HCT 32.8* 06/06/2013   MCV 72.9* 06/06/2013   PLT 300 06/06/2013    Assessment / Plan: Induction of labor due to gestational hypertension,  progressing well on pitocin  Labor: Progressing normally Preeclampsia:  no signs or symptoms of toxicity, labs stable and P:C ratio normal at 0.14 Fetal Wellbeing:  Category I Pain Control:  Labor support without medications and vicodin for tooth pain  I/D:  n/a Anticipated MOD:  NSVD  Coy Saunas 06/07/2013, 3:17 AM

## 2013-06-07 NOTE — Progress Notes (Signed)
I was consulted RE: POC and agree with above.  [place], CNM 06/07/2013 9:35 AM

## 2013-06-07 NOTE — Lactation Note (Signed)
This note was copied from the chart of Angel Kyasia Steuck. Lactation Consultation Note    Follow up consult with this mom and baby. Mom attempted latching on right breast, with inverted nipple. Baby had  Trouble maintaning latch, staying on nipple, so i tried a 24 nipple shield. She suckled deeply and well, but then gagged. I then tried a 20 nipple shield, which seemed to fit both mom and baby well, . She again latched, but after a few minutes, gagged. I gave mom a dropper and curved tip syringe, and instructed her in ti;s use. i suggested she pump her right breast , if the baby will not latch, and finger feed with curved tip syringe , or dropper feed colostrum to the baby. Mom is not having trouble latching on the left breast, which has a norrmal nipple. If baby is satisfied with left, mom can still pump right and save EBM for now.  Patient Name: Angel Pratt [id number] Date: 06/07/2013 Reason for consult: Follow-up assessment   Maternal Data Formula Feeding for Exclusion: No Infant to breast within first hour of birth: Yes Has patient been taught Hand Expression?: Yes Does the patient have breastfeeding experience prior to this delivery?: Yes  Feeding Feeding Type: Breast Milk  LATCH Score/Interventions Latch: Repeated attempts needed to sustain latch, nipple held in mouth throughout feeding, stimulation needed to elicit sucking reflex. (24 nipple shile gagged baby, 20 better, bur baby also gagged) Intervention(s): Adjust position;Assist with latch;Breast massage;Breast compression  Audible Swallowing: None  Type of Nipple: Inverted (right side only)  Comfort (Breast/Nipple): Soft / non-tender     Hold (Positioning): Assistance needed to correctly position infant at breast and maintain latch. Intervention(s): Breastfeeding basics reviewed;Support Pillows;Position options;Skin to skin (football hold with boppy used)  LATCH Score: 4  Lactation Tools Discussed/Used Tools: Shells  (mom has but noit wearing ) Date initiated:: 06/07/13   Consult Status Consult Status: Follow-up Date: 06/08/13 Follow-up type: In-patient    Alfred Levins 06/07/2013, 6:43 PM

## 2013-06-07 NOTE — Anesthesia Procedure Notes (Signed)
Epidural Patient location during procedure: OB Start time: 06/07/2013 4:22 AM  Staffing Performed by: anesthesiologist   Preanesthetic Checklist Completed: patient identified, site marked, surgical consent, pre-op evaluation, timeout performed, IV checked, risks and benefits discussed and monitors and equipment checked  Epidural Patient position: sitting Prep: site prepped and draped and DuraPrep Patient monitoring: continuous pulse ox and blood pressure Approach: midline Injection technique: LOR air  Needle:  Needle type: Tuohy  Needle gauge: 17 G Needle length: 9 cm and 9 Needle insertion depth: 6 cm Catheter type: closed end flexible Catheter size: 19 Gauge Catheter at skin depth: 11 cm Test dose: negative  Assessment Events: blood not aspirated, injection not painful, no injection resistance, negative IV test and no paresthesia  Additional Notes Discussed risk of headache, infection, bleeding, nerve injury and failed or incomplete block.  Patient voices understanding and wishes to proceed.  Epidural placed easily on first attempt.  No paresthesia.  Patient tolerated procedure well with no apparent complications.  Jasmine December, MDReason for block:procedure for pain

## 2013-06-07 NOTE — Anesthesia Preprocedure Evaluation (Signed)
Anesthesia Evaluation  Patient identified by MRN, date of birth, ID band Patient awake    Reviewed: Allergy & Precautions, H&P , NPO status , Patient's Chart, lab work & pertinent test results, reviewed documented beta blocker date and time   History of Anesthesia Complications Negative for: history of anesthetic complications  Airway Mallampati: II TM Distance: >3 FB Neck ROM: full    Dental  (+) Teeth Intact   Pulmonary neg pulmonary ROS,  breath sounds clear to auscultation        Cardiovascular negative cardio ROS  Rhythm:regular Rate:Normal     Neuro/Psych negative neurological ROS  negative psych ROS   GI/Hepatic negative GI ROS, Neg liver ROS,   Endo/Other  negative endocrine ROS  Renal/GU Renal disease (kidney stones - taking vicodin)     Musculoskeletal   Abdominal   Peds  Hematology  (+) anemia ,   Anesthesia Other Findings   Reproductive/Obstetrics (+) Pregnancy                           Anesthesia Physical Anesthesia Plan  ASA: II  Anesthesia Plan: Epidural   Post-op Pain Management:    Induction:   Airway Management Planned:   Additional Equipment:   Intra-op Plan:   Post-operative Plan:   Informed Consent: I have reviewed the patients History and Physical, chart, labs and discussed the procedure including the risks, benefits and alternatives for the proposed anesthesia with the patient or authorized representative who has indicated his/her understanding and acceptance.     Plan Discussed with:   Anesthesia Plan Comments:         Anesthesia Quick Evaluation

## 2013-06-07 NOTE — H&P (Signed)
Attestation of Attending Supervision of Advanced Practitioner (CNM/NP): Evaluation and management procedures were performed by the Advanced Practitioner under my supervision and collaboration.  I have reviewed the Advanced Practitioner's note and chart, and I agree with the management and plan.  HARRAWAY-SMITH, Alie Hardgrove 7:16 AM     

## 2013-06-08 LAB — CBC
HCT: 28.4 % — ABNORMAL LOW (ref 36.0–46.0)
Hemoglobin: 8.9 g/dL — ABNORMAL LOW (ref 12.0–15.0)
MCH: 23.1 pg — ABNORMAL LOW (ref 26.0–34.0)
MCHC: 31.3 g/dL (ref 30.0–36.0)
RDW: 16.7 % — ABNORMAL HIGH (ref 11.5–15.5)

## 2013-06-08 MED ORDER — RHO D IMMUNE GLOBULIN 1500 UNIT/2ML IJ SOLN
300.0000 ug | Freq: Once | INTRAMUSCULAR | Status: AC
  Administered 2013-06-08: 300 ug via INTRAMUSCULAR
  Filled 2013-06-08: qty 2

## 2013-06-08 NOTE — Progress Notes (Signed)
Post Partum Day 1 Subjective: up ad lib, voiding and tolerating PO. Patient is a 22 year old [id number]. She complains of mild lower abdominal cramping and vaginal bleeding such that she typically changes her pads every 2 hours. She denies any nausea, vomiting, dizziness, chest pain, SOB, or headaches. She has been able to void urine but has not had a bowel movement since before the delivery.   Objective: Blood pressure 109/68, pulse 86, temperature 97.8 F (36.6 C), temperature source Oral, resp. rate 18, height 5\' 3"  (1.6 m), weight 86.637 kg (191 lb), last menstrual period 09/01/2012, SpO2 99.00%, unknown if currently breastfeeding.  Physical Exam:  General: alert, cooperative and no distress Lochia: appropriate Uterine Fundus: firm Incision: no incision  DVT Evaluation: No evidence of DVT seen on physical exam. Negative Homan's sign. No cords or calf tenderness. No significant calf/ankle edema.   Recent Labs  06/06/13 2000 06/08/13 0610  HGB 10.5* 8.9*  HCT 32.8* 28.4*    Assessment/Plan: Plan for discharge tomorrow and Breastfeeding. Patient would like to stay one more night in the hospital. Patient states that her husband is planning on getting a vasectomy performed in the near future, although no actual appt is set. She would not like birth control in the meantime.    LOS: 2 days   Coy Saunas 06/08/2013, 7:38 AM

## 2013-06-08 NOTE — Progress Notes (Signed)
I spoke with and examined patient and agree with PA-S's note and plan of care.  Tawana Scale, MD Ob Fellow 06/08/2013 9:57 AM

## 2013-06-09 LAB — RH IG WORKUP (INCLUDES ABO/RH)
ABO/RH(D): O NEG
Unit division: 0

## 2013-06-09 MED ORDER — IBUPROFEN 600 MG PO TABS: 600.0000 mg | ORAL_TABLET | Freq: Four times a day (QID) | ORAL | Status: DC

## 2013-06-09 NOTE — Discharge Summary (Signed)
Obstetric Discharge Summary Reason for Admission: induction of labor Prenatal Procedures: none Intrapartum Procedures: spontaneous vaginal delivery Postpartum Procedures: none Complications-Operative and Postpartum: none  22 year old [id number] 37.2 wks presented for induction of labor secondary to gestational hypertension went on to deliver with epidural anesthesia. 3 vessel cord spontaneous placenta EBL 200 mL, with no lacerations. APGAR's 9 and 9. Mom is breastfeeding. Father plans to have vasectomy and mother would not like birth control in the meantime.   Hemoglobin  Date Value Range Status  06/08/2013 8.9* 12.0 - 15.0 g/dL Final     HCT  Date Value Range Status  06/08/2013 28.4* 36.0 - 46.0 % Final    Physical Exam:  General: alert, cooperative and no distress Lochia: appropriate Uterine Fundus: firm Incision: no incision  DVT Evaluation: No evidence of DVT seen on physical exam. Negative Homan's sign. No cords or calf tenderness. Calf/Ankle edema is present.  Discharge Diagnoses: Term Pregnancy-delivered  Discharge Information: Date: 06/09/2013 Activity: unrestricted Diet: routine Medications: Ibuprofen and Colace Condition: stable Instructions: refer to practice specific booklet Discharge to: home   Newborn Data: Live born female  Birth Weight: 6 lb 12.5 oz (3075 g) APGAR: 9, 9  Home with mother.  Coy Saunas 06/09/2013, 7:38 AM  I have seen and examined this patient and agree with above documentation in the PA student's note.   Rulon Abide, M.D. [facility] Fellow 06/09/2013 11:00 AM

## 2013-06-09 NOTE — Lactation Note (Signed)
This note was copied from the chart of Angel Pratt. Lactation Consultation Note  Patient Name: Angel Pratt [id number] Date: 06/09/2013 Reason for consult: Follow-up assessment  Mother is not latching her baby. She has decided to express her milk and feed by bottle. She pumped for 2 weeks with her last baby. Infant tolerates the bottle well. Left nipple healing well. Milk is coming in. Discussed power pumping during infant's growth spurts. To call LC if needs assistance or has questions after d/c.  Maternal Data    Feeding Feeding Type: Breast Milk  LATCH Score/Interventions                      Lactation Tools Discussed/Used     Consult Status Consult Status: Complete    Omar Person 06/09/2013, 11:52 AM

## 2013-06-09 NOTE — Discharge Summary (Signed)
Attestation of Attending Supervision of Advanced Practitioner (PA/CNM/NP): Evaluation and management procedures were performed by the Advanced Practitioner under my supervision and collaboration.  I have reviewed the Advanced Practitioner's note and chart, and I agree with the management and plan.  Sindy Mccune, MD, FACOG Attending Obstetrician & Gynecologist Faculty Practice, [facility]  

## 2013-07-19 ENCOUNTER — Encounter: Payer: Self-pay | Admitting: Obstetrics and Gynecology

## 2013-07-19 ENCOUNTER — Ambulatory Visit (INDEPENDENT_AMBULATORY_CARE_PROVIDER_SITE_OTHER): Payer: 59 | Admitting: Obstetrics and Gynecology

## 2013-07-19 DIAGNOSIS — Z124 Encounter for screening for malignant neoplasm of cervix: Secondary | ICD-10-CM

## 2013-07-19 MED ORDER — NORGESTIMATE-ETH ESTRADIOL 0.25-35 MG-MCG PO TABS: 1.0000 | ORAL_TABLET | Freq: Every day | ORAL | Status: DC

## 2013-07-19 NOTE — Progress Notes (Signed)
  Subjective:     Angel Pratt is a 22 y.o. female who presents for a postpartum visit. She is 6 weeks postpartum following a spontaneous vaginal delivery. I have fully reviewed the prenatal and intrapartum course. The delivery was at 37 gestational weeks secondary to [facility]. Outcome: spontaneous vaginal delivery. Anesthesia: epidural. Postpartum course has been uncomplicated. Baby's course has been uncomplicated. Baby is feeding by breast. Bleeding no bleeding. Bowel function is normal. Bladder function is normal. Patient is sexually active. Contraception method is condoms and vasectomy. Postpartum depression screening: negative.     Review of Systems A comprehensive review of systems was negative.   Objective:    There were no vitals taken for this visit.  General:  alert, cooperative and no distress   Breasts:  inspection negative, no nipple discharge or bleeding, no masses or nodularity palpable  Lungs: clear to auscultation bilaterally  Heart:  regular rate and rhythm  Abdomen: soft, non-tender; bowel sounds normal; no masses,  no organomegaly   Vulva:  normal  Vagina: normal vagina, no discharge, exudate, lesion, or erythema  Cervix:  multiparous appearance  Corpus: normal size, contour, position, consistency, mobility, non-tender  Adnexa:  no mass, fullness, tenderness  Rectal Exam: Not performed.        Assessment:     Normal postpartum exam. Pap smear done at today's visit.   Plan:    1. Contraception: condoms and vasectomy 2. Husband is scheduled for vasectomy in January. Patient desires OCP for now. She has taken them in the past without complications. 3. Follow up in: 1 year or as needed.

## 2013-08-10 ENCOUNTER — Other Ambulatory Visit: Payer: Self-pay

## 2013-12-19 ENCOUNTER — Ambulatory Visit (INDEPENDENT_AMBULATORY_CARE_PROVIDER_SITE_OTHER): Payer: 59 | Admitting: Obstetrics & Gynecology

## 2013-12-19 ENCOUNTER — Encounter: Payer: Self-pay | Admitting: Obstetrics & Gynecology

## 2013-12-19 VITALS — BP 118/87 | HR 100 | Ht 62.0 in | Wt 171.6 lb

## 2013-12-19 DIAGNOSIS — Z113 Encounter for screening for infections with a predominantly sexual mode of transmission: Secondary | ICD-10-CM

## 2013-12-19 DIAGNOSIS — N920 Excessive and frequent menstruation with regular cycle: Secondary | ICD-10-CM

## 2013-12-19 MED ORDER — MEDROXYPROGESTERONE ACETATE 10 MG PO TABS: 20.0000 mg | ORAL_TABLET | Freq: Every day | ORAL | Status: DC

## 2013-12-19 NOTE — Progress Notes (Signed)
Subjective:     Patient ID: Angel Pratt, female   DOB: 09-Aug-1991, 23 y.o.   MRN: [id number]  HPI P reprots being on OCp's she missed some tabs 7 weeks ago and doubled up for 2 days and has had irreg menses since that time.  She has no pain with her menses.  No concern for STI's per pt.   Review of Systems     Objective:   Physical Exam BP 118/87  Pulse 100  Ht 5\' 2"  (1.575 m)  Wt 171 lb 9.6 oz (77.837 kg)  BMI 31.38 kg/m2  LMP 12/19/2013 Pt in NAD Abd: Soft, NT, ND GU: EGBUS: no lesions Vagina: + blood in vault Cervix: no lesion; no mucopurulent d/c; no CMT Uterus: slightly enlarged ~10 weeks sized, mobile Adnexa: no masses; non tender         Assessment:     Irreg bleeding- enlarged uterus      Plan:     Pelvic sono Provera 20mg  daily for 10 days Restart OCP's as soon as Provera course is completed F/u 3 months or sooner prn F/u cervical cx

## 2013-12-19 NOTE — Progress Notes (Signed)
Patient has been bleeding for 7weeks.  She is taking orthocyclen for contraception.

## 2013-12-19 NOTE — Patient Instructions (Signed)
Menorrhagia  Menorrhagia is the medical term for when your menstrual periods are heavy or last longer than usual. With menorrhagia, every period you have may cause enough blood loss and cramping that you are unable to maintain your usual activities.  CAUSES   In some cases, the cause of heavy periods is unknown, but a number of conditions may cause menorrhagia. Common causes include:  · A problem with the hormone-producing thyroid gland (hypothyroid).  · Noncancerous growths in the uterus (polyps or fibroids).  · An imbalance of the estrogen and progesterone hormones.  · One of your ovaries not releasing an egg during one or more months.  · Side effects of having an intrauterine device (IUD).  · Side effects of some medicines, such as anti-inflammatory medicines or blood thinners.  · A bleeding disorder that stops your blood from clotting normally.  SIGNS AND SYMPTOMS   During a normal period, bleeding lasts between 4 and 8 days. Signs that your periods are too heavy include:  · You routinely have to change your pad or tampon every 1 or 2 hours because it is completely soaked.  · You pass blood clots larger than 1 inch (2.5 cm) in size.  · You have bleeding for more than 7 days.  · You need to use pads and tampons at the same time because of heavy bleeding.  · You need to wake up to change your pads or tampons during the night.  · You have symptoms of anemia, such as tiredness, fatigue, or shortness of breath.   DIAGNOSIS   Your health care provider will perform a physical exam and ask you questions about your symptoms and menstrual history. Other tests may be ordered based on what the health care provider finds during the exam. These tests can include:  · Blood tests To check if you are pregnant or have hormonal changes, a bleeding or thyroid disorder, low iron levels (anemia), or other problems.  · Endometrial biopsy Your health care provider takes a sample of tissue from the inside of your uterus to be examined  under a microscope.  · Pelvic ultrasound This test uses sound waves to make a picture of your uterus, ovaries, and vagina. The pictures can show if you have fibroids or other growths.  · Hysteroscopy For this test, your health care provider will use a small telescope to look inside your uterus.  Based on the results of your initial tests, your health care provider may recommend further testing.  TREATMENT   Treatment may not be needed. If it is needed, your health care provider may recommend treatment with one or more medicines first. If these do not reduce bleeding enough, a surgical treatment might be an option. The best treatment for you will depend on:   · Whether you need to prevent pregnancy.    · Your desire to have children in the future.  · The cause and severity of your bleeding.  · Your opinion and personal preference.    Medicines for menorrhagia may include:  · Birth control methods that use hormones These include the pill, skin patch, vaginal ring, shots that you get every 3 months, hormonal IUD, and implant. These treatments reduce bleeding during your menstrual period.  · Medicines that thicken blood and slow bleeding.  · Medicines that reduce swelling, such as ibuprofen.   · Medicines that contain a synthetic hormone called progestin.    · Medicines that make the ovaries stop working for a short time.    You may need surgical   treatment for menorrhagia if the medicines are unsuccessful. Treatment options include:  · Dilation and curettage (D&C) In this procedure, your health care provider opens (dilates) your cervix and then scrapes or suctions tissue from the lining of your uterus to reduce menstrual bleeding.  · Operative hysteroscopy This procedure uses a tiny tube with a light (hysteroscope) to view your uterine cavity and can help in the surgical removal of a polyp that may be causing heavy periods.  · Endometrial ablation Through various techniques, your health care provider permanently  destroys the entire lining of your uterus (endometrium). After endometrial ablation, most women have little or no menstrual flow. Endometrial ablation reduces your ability to become pregnant.  · Endometrial resection This surgical procedure uses an electrosurgical wire loop to remove the lining of the uterus. This procedure also reduces your ability to become pregnant.  · Hysterectomy Surgical removal of the uterus and cervix is a permanent procedure that stops menstrual periods. Pregnancy is not possible after a hysterectomy. This procedure requires anesthesia and hospitalization.  HOME CARE INSTRUCTIONS   · Only take over-the-counter or prescription medicines as directed by your health care provider. Take prescribed medicines exactly as directed. Do not change or switch medicines without consulting your health care provider.  · Take any prescribed iron pills exactly as directed by your health care provider. Long-term heavy bleeding may result in low iron levels. Iron pills help replace the iron your body lost from heavy bleeding. Iron may cause constipation. If this becomes a problem, increase the bran, fruits, and roughage in your diet.  · Do not take aspirin or medicines that contain aspirin 1 week before or during your menstrual period. Aspirin may make the bleeding worse.  · If you need to change your sanitary pad or tampon more than once every 2 hours, stay in bed and rest as much as possible until the bleeding stops.  · Eat well-balanced meals. Eat foods high in iron. Examples are leafy green vegetables, meat, liver, eggs, and whole grain breads and cereals. Do not try to lose weight until the abnormal bleeding has stopped and your blood iron level is back to normal.  SEEK MEDICAL CARE IF:   · You soak through a pad or tampon every 1 or 2 hours, and this happens every time you have a period.  · You need to use pads and tampons at the same time because you are bleeding so much.  · You need to change your pad  or tampon during the night.  · You have a period that lasts for more than 8 days.  · You pass clots bigger than 1 inch wide.  · You have irregular periods that happen more or less often than once a month.  · You feel dizzy or faint.  · You feel very weak or tired.  · You feel short of breath or feel your heart is beating too fast when you exercise.  · You have nausea and vomiting or diarrhea while you are taking your medicine.  · You have any problems that may be related to the medicine you are taking.  SEEK IMMEDIATE MEDICAL CARE IF:   · You soak through 4 or more pads or tampons in 2 hours.  · You have any bleeding while you are pregnant.  MAKE SURE YOU:   · Understand these instructions.  · Will watch your condition.  · Will get help right away if you are not doing well or get worse.    Document Released: 09/21/2005 Document Revised: 07/12/2013 Document Reviewed: 03/12/2013  [employer]® Patient Information ©2014 [employer].

## 2013-12-19 NOTE — Addendum Note (Signed)
Addended by: Tandy GawHINTON, Shalandra Leu C on: 12/19/2013 01:41 PM   Modules accepted: Orders

## 2013-12-20 LAB — GC/CHLAMYDIA PROBE AMP
CT Probe RNA: NEGATIVE
GC PROBE AMP APTIMA: NEGATIVE

## 2013-12-28 ENCOUNTER — Ambulatory Visit (HOSPITAL_COMMUNITY): Payer: 59

## 2014-01-02 ENCOUNTER — Ambulatory Visit (HOSPITAL_COMMUNITY)
Admission: RE | Admit: 2014-01-02 | Discharge: 2014-01-02 | Disposition: A | Discharge status: Home / Self Care | Payer: 59 | Source: Ambulatory Visit | Attending: Obstetrics & Gynecology | Admitting: Obstetrics & Gynecology

## 2014-01-02 DIAGNOSIS — N949 Unspecified condition associated with female genital organs and menstrual cycle: Secondary | ICD-10-CM | POA: Insufficient documentation

## 2014-01-02 DIAGNOSIS — N920 Excessive and frequent menstruation with regular cycle: Secondary | ICD-10-CM

## 2014-01-02 DIAGNOSIS — N854 Malposition of uterus: Secondary | ICD-10-CM | POA: Insufficient documentation

## 2014-01-04 ENCOUNTER — Telehealth: Payer: Self-pay | Admitting: *Deleted

## 2014-01-04 NOTE — Telephone Encounter (Signed)
Message copied by Barbara CowerNOGUES, Sinclair Alligood L on Thu Jan 04, 2014 10:39 AM ------      Message from: Willodean RosenthalHARRAWAY-SMITH, CAROLYN      Created: Tue Jan 02, 2014  2:21 PM       Please notify pt that her sono was normal.            Thx,      clh-S  ------

## 2014-01-04 NOTE — Telephone Encounter (Signed)
Patient notified of normal results, she has a follow up scheduled to discuss her bleeding and these results.

## 2014-01-17 ENCOUNTER — Ambulatory Visit: Payer: 59 | Admitting: Obstetrics & Gynecology

## 2014-05-19 IMAGING — US US OB LIMITED
1 series · 13 of 22 positions shown · non-contrast
Comparison: none

[Series 1: us ob limited · 0.23mm/px · 13 of 22 slices shown]
[im 1/22]
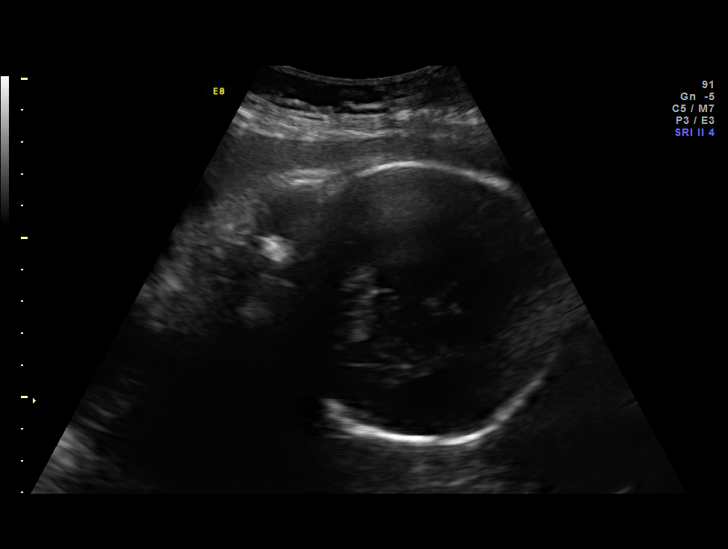
[im 3/22]
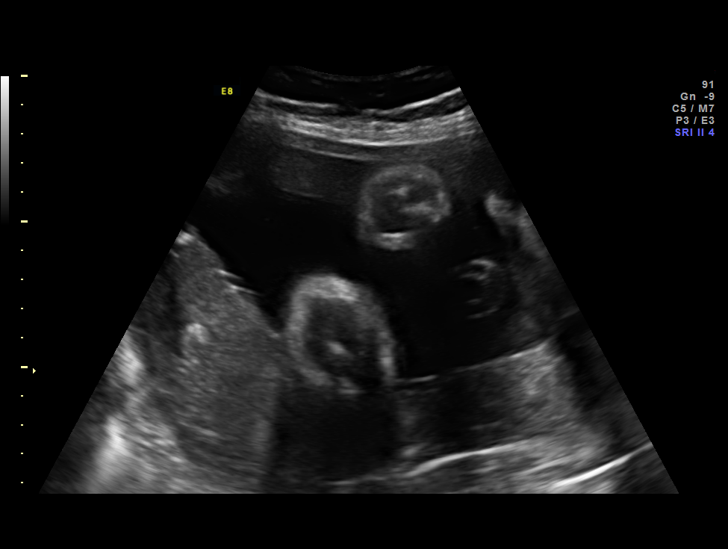
[im 5/22]
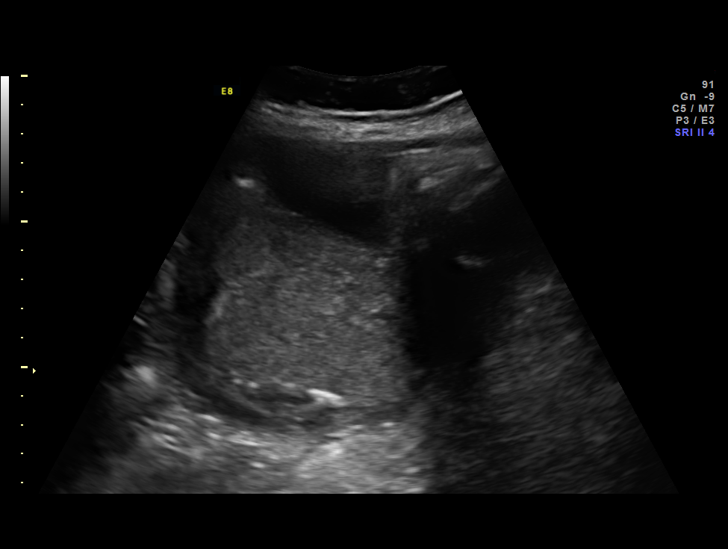
[im 6/22]
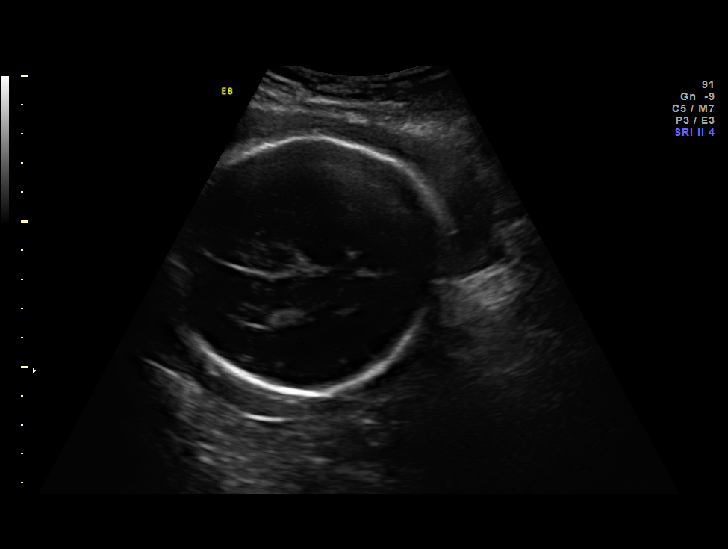
[im 8/22]
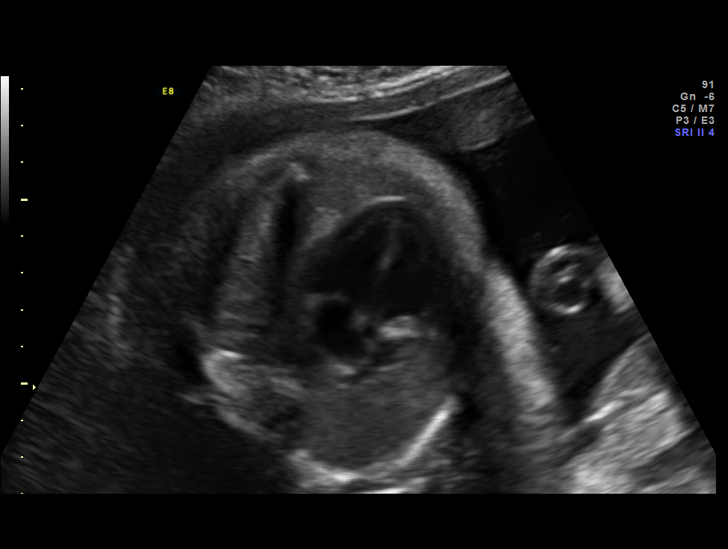
[im 10/22]
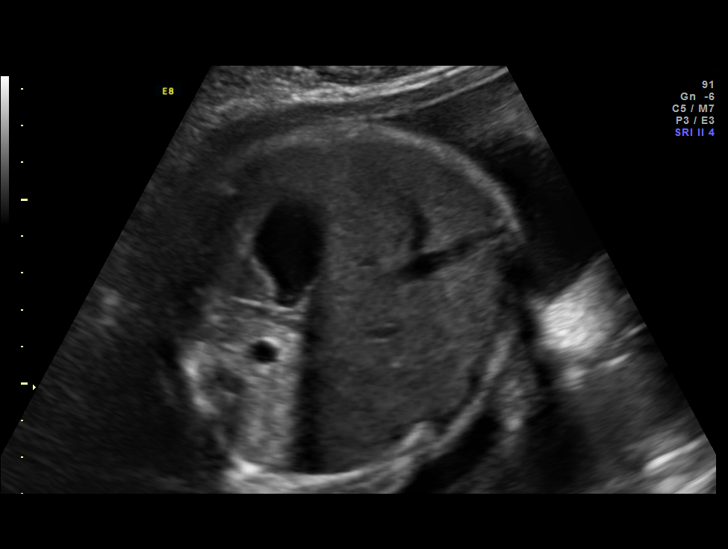
[im 12/22]
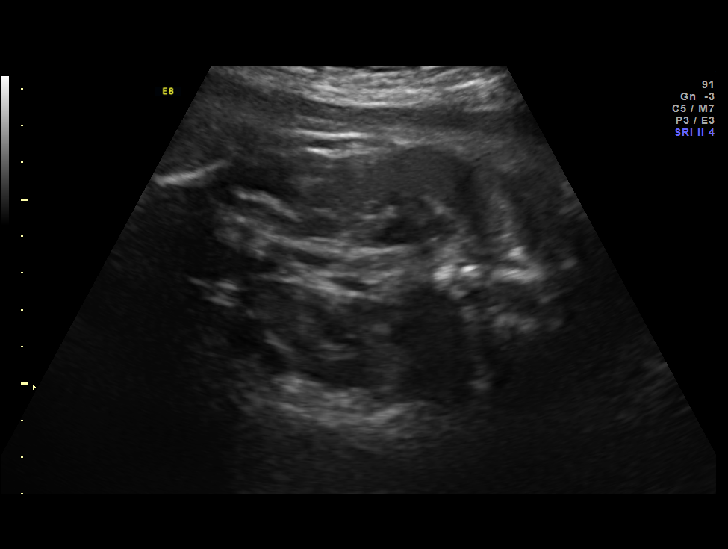
[im 13/22]
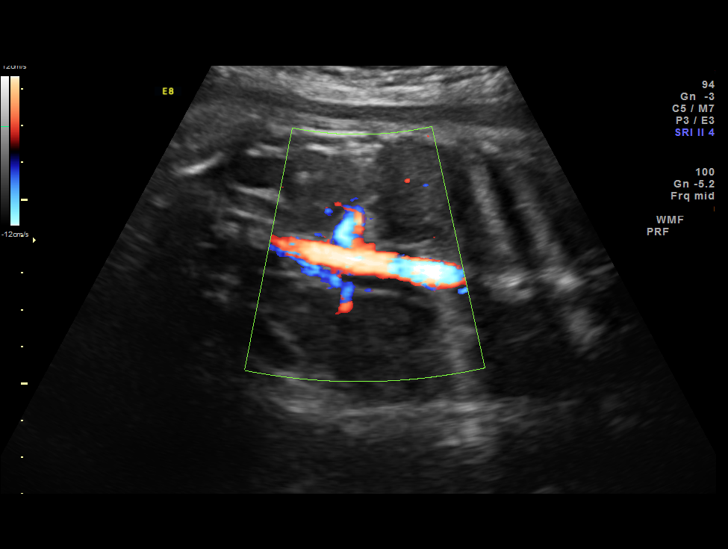
[im 15/22]
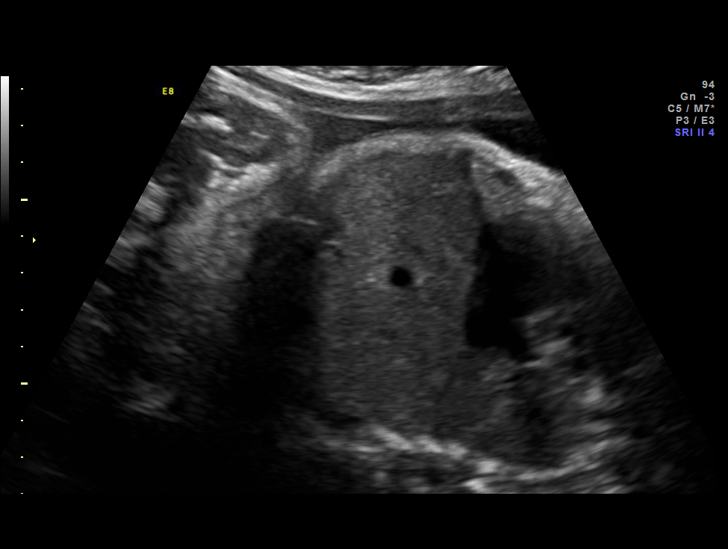
[im 17/22]
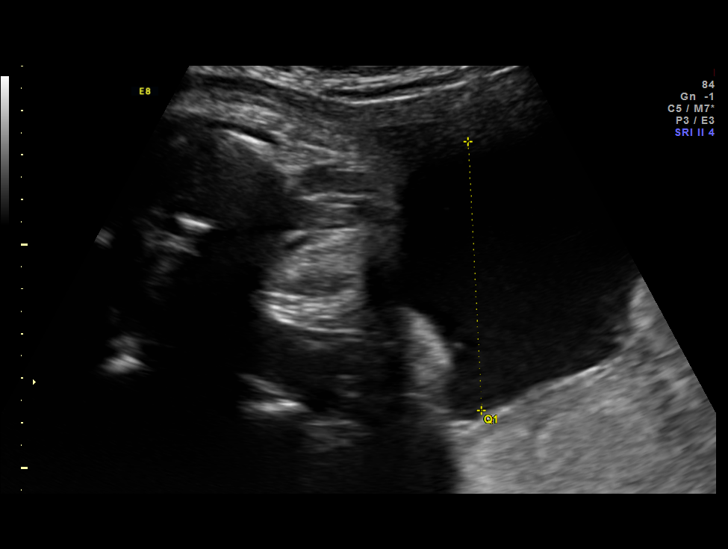
[im 18/22]
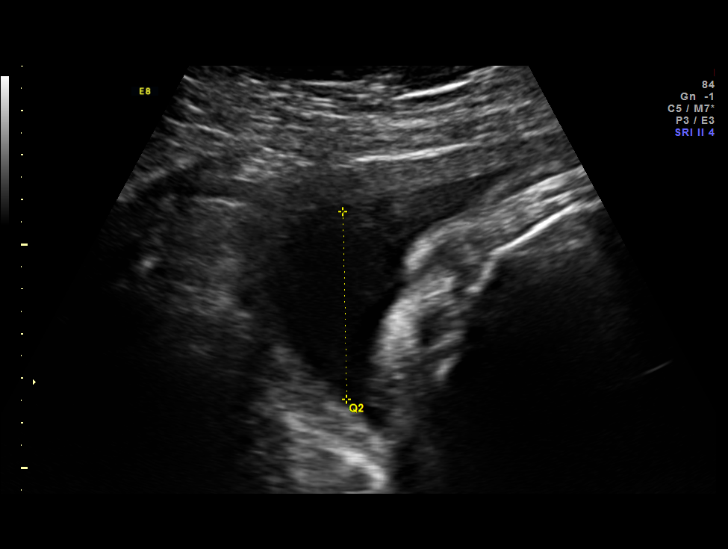
[im 20/22]
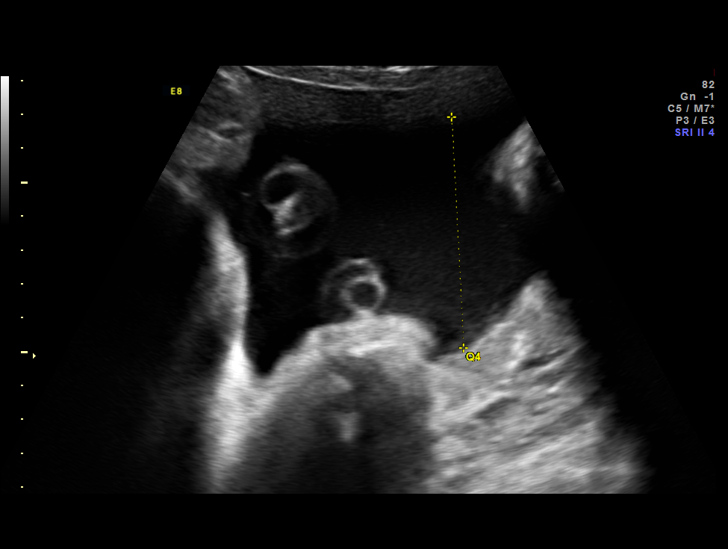
[im 22/22]
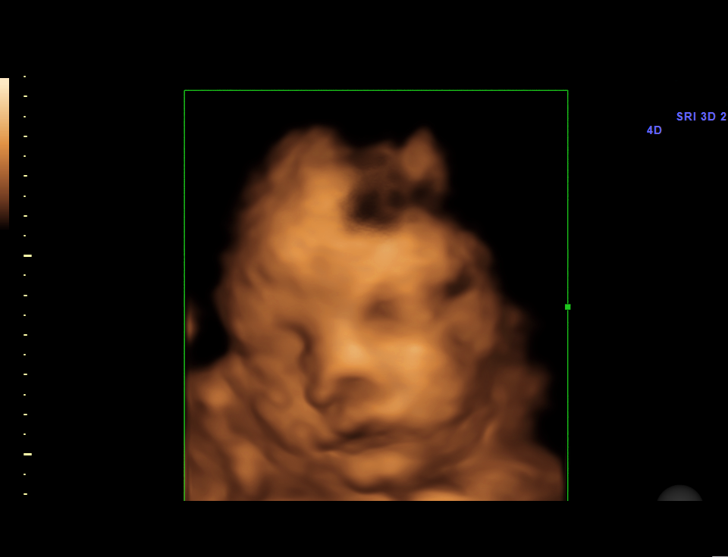

[13 of 22 positions shown; findings below may reference images not displayed]

OBSTETRICS REPORT
                      (Signed Final 05/19/2013 [DATE])

Service(s) Provided

 [HOSPITAL]                                         76815.0
Indications

 Hypertension - Gestational
 Poor obstetric history: Previous preeclampsia /
 eclampsia/gestational HTN
 Fetal abnormality - pelvic cyst (resolved)
 Size greater than dates (Large for gestational [AGE]
 Poor obstetric history-Recurrent (habitual) abortion
 (3 consecutive ab's)
Fetal Evaluation

 Num Of Fetuses:    1
 Fetal Heart Rate:  150                          bpm
 Cardiac Activity:  Observed
 Presentation:      Cephalic
 Placenta:          Posterior, above cervical
                    os
 P. Cord            Previously Visualized
 Insertion:

 Amniotic Fluid
 AFI FV:      Subjectively upper limits of normal
 AFI Sum:     20.52   cm       77  %Tile     Larg Pckt:    6.83  cm
 RUQ:   6.03    cm   RLQ:    6.83   cm    LUQ:   4.21    cm   LLQ:    3.45   cm
Gestational Age

 LMP:           37w 1d        Date:  09/01/12                 EDD:   06/08/13
 Best:          34w 4d     Det. By:  U/S C R L (11/14/12)     EDD:   06/26/13
Cervix Uterus Adnexa

 Cervix:       Not visualized (advanced GA >92wks)
Impression

 IUP at 34+4 weeks
 High normal amniotic fluid volume
 NST reactive

Recommendations

 Continue twice weekly NSTs with weekly AFIs
 Growth US in 3 weeks

## 2014-08-06 ENCOUNTER — Encounter: Payer: Self-pay | Admitting: Obstetrics & Gynecology

## 2014-09-19 ENCOUNTER — Ambulatory Visit: Payer: BC Managed Care – PPO | Admitting: Obstetrics and Gynecology

## 2014-10-10 ENCOUNTER — Ambulatory Visit: Payer: BC Managed Care – PPO | Admitting: Obstetrics and Gynecology

## 2015-04-17 ENCOUNTER — Ambulatory Visit (INDEPENDENT_AMBULATORY_CARE_PROVIDER_SITE_OTHER): Payer: 59 | Admitting: *Deleted

## 2015-04-17 DIAGNOSIS — R309 Painful micturition, unspecified: Secondary | ICD-10-CM

## 2015-04-17 LAB — POCT URINALYSIS DIPSTICK
Bilirubin, UA: NEGATIVE
Blood, UA: NEGATIVE
Glucose, UA: NEGATIVE
Ketones, UA: NEGATIVE
Nitrite, UA: NEGATIVE
Protein, UA: NEGATIVE
Spec Grav, UA: <=1.005
UROBILINOGEN UA: NEGATIVE
pH, UA: 6.5

## 2015-04-17 NOTE — Progress Notes (Signed)
Patient here today to drop off a urine specimen.  I will send off for culture.

## 2015-04-18 LAB — URINE CULTURE
Colony Count: NO GROWTH
ORGANISM ID, BACTERIA: NO GROWTH

## 2015-04-18 NOTE — Progress Notes (Signed)
Patient ID: Angel PleasureJessica M Mccuistion, female   DOB: November 15, 1990, 24 y.o.   MRN: [id number] Patient was not seen by a provider on 04/17/2015

## 2015-04-22 ENCOUNTER — Ambulatory Visit (INDEPENDENT_AMBULATORY_CARE_PROVIDER_SITE_OTHER): Payer: 59 | Admitting: Obstetrics and Gynecology

## 2015-04-22 ENCOUNTER — Encounter: Payer: Self-pay | Admitting: Obstetrics and Gynecology

## 2015-04-22 VITALS — BP 134/90 | HR 128 | Resp 16 | Ht 62.0 in | Wt 166.0 lb

## 2015-04-22 DIAGNOSIS — Z331 Pregnant state, incidental: Secondary | ICD-10-CM

## 2015-04-22 DIAGNOSIS — Z01419 Encounter for gynecological examination (general) (routine) without abnormal findings: Secondary | ICD-10-CM | POA: Diagnosis not present

## 2015-04-22 DIAGNOSIS — Z3201 Encounter for pregnancy test, result positive: Secondary | ICD-10-CM

## 2015-04-22 DIAGNOSIS — Z1151 Encounter for screening for human papillomavirus (HPV): Secondary | ICD-10-CM

## 2015-04-22 DIAGNOSIS — Z113 Encounter for screening for infections with a predominantly sexual mode of transmission: Secondary | ICD-10-CM

## 2015-04-22 DIAGNOSIS — Z124 Encounter for screening for malignant neoplasm of cervix: Secondary | ICD-10-CM

## 2015-04-22 LAB — POCT URINE PREGNANCY: Preg Test, Ur: POSITIVE — AB

## 2015-04-22 MED ORDER — PRENATAL VITAMINS 0.8 MG PO TABS: 1.0000 | ORAL_TABLET | Freq: Every day | ORAL | Status: DC

## 2015-04-22 NOTE — Addendum Note (Signed)
Addended by: Tandy GawHINTON, Sharlotte Baka C on: 04/22/2015 04:22 PM   Modules accepted: Orders

## 2015-04-22 NOTE — Progress Notes (Signed)
  Subjective:     Angel Pratt is a 24 y.o. female [phone] with LMP 02/05/2015 who is here for a comprehensive physical exam. The patient reports amenorrhea since May. She stopped using OCP secondary to persistent vaginal bleeding. Patient has been sexually active without contraception  History   Social History  . Marital Status: Married    Spouse Name: N/A  . Number of Children: N/A  . Years of Education: N/A   Occupational History  . Not on file.   Social History Main Topics  . Smoking status: Never Smoker   . Smokeless tobacco: Never Used  . Alcohol Use: No  . Drug Use: No  . Sexual Activity:    Partners: Male    Birth Control/ Protection: Condom   Other Topics Concern  . Not on file   Social History Narrative   Health Maintenance  Topic Date Due  . INFLUENZA VACCINE  05/06/2015  . PAP SMEAR  07/19/2016  . TETANUS/TDAP  03/24/2023  . HIV Screening  Completed   Past Medical History  Diagnosis Date  . Preeclampsia   . Pregnancy induced hypertension   . Pyelonephritis complicating pregnancy 03/2013  . Anemia   . Ovarian cyst   . Kidney stones    Past Surgical History  Procedure Laterality Date  . Tonsillectomy      2009  . Dilation and curettage of uterus     Family History  Problem Relation Age of Onset  . Cancer Mother     cervial dysplasia  . Ovarian cysts Mother   . Cancer Maternal Aunt     cervical  . Cancer Maternal Grandmother 40    Cervical and Breast one breast  . Ovarian cysts Maternal Grandmother   . Diabetes Maternal Grandfather        Review of Systems Pertinent items are noted in HPI.   Objective:      GENERAL: Well-developed, well-nourished female in no acute distress.  HEENT: Normocephalic, atraumatic. Sclerae anicteric.  NECK: Supple. Normal thyroid.  LUNGS: Clear to auscultation bilaterally.  HEART: Regular rate and rhythm. BREASTS: Symmetric in size. No palpable masses or lymphadenopathy, skin changes, or nipple  drainage. ABDOMEN: Soft, nontender, nondistended. No organomegaly. PELVIC: Normal external female genitalia. Vagina is pink and rugated.  Normal discharge. Normal appearing cervix. Uterus is normal in size. No adnexal mass or tenderness. EXTREMITIES: No cyanosis, clubbing, or edema, 2+ distal pulses.    Assessment:    Healthy female exam.      Plan:    pap smear collected UPT positive- patient informed and in shock as her husband is scheduled for a vasectomy today. Dating ultrasound performed in the office revealing a living IUP measuring 2533w1d Prenatal labs collected Patient will be informed of any abnormal results Patient will be scheduled for prenatal appointment See After Visit Summary for Counseling Recommendations

## 2015-04-24 LAB — CYTOLOGY - PAP

## 2015-05-20 ENCOUNTER — Encounter: Payer: Self-pay | Admitting: Family Medicine

## 2015-05-20 ENCOUNTER — Ambulatory Visit (INDEPENDENT_AMBULATORY_CARE_PROVIDER_SITE_OTHER): Payer: 59 | Admitting: Family Medicine

## 2015-05-20 VITALS — BP 134/82 | HR 108 | Wt 169.0 lb

## 2015-05-20 DIAGNOSIS — O0991 Supervision of high risk pregnancy, unspecified, first trimester: Secondary | ICD-10-CM

## 2015-05-20 DIAGNOSIS — O09292 Supervision of pregnancy with other poor reproductive or obstetric history, second trimester: Secondary | ICD-10-CM

## 2015-05-20 DIAGNOSIS — O09291 Supervision of pregnancy with other poor reproductive or obstetric history, first trimester: Secondary | ICD-10-CM

## 2015-05-20 MED ORDER — ASPIRIN 81 MG PO CHEW: 81.0000 mg | CHEWABLE_TABLET | Freq: Every day | ORAL | Status: DC

## 2015-05-20 NOTE — Progress Notes (Signed)
Pt not candidate for baby scripts due to hx severe PIH with previous pregnancy

## 2015-05-20 NOTE — Progress Notes (Signed)
Subjective:    Angel Pratt is a [id number] [redacted]w[redacted]d being seen today for her first obstetrical visit.  Her obstetrical history is significant for h/o pre-eclampsia. Patient here for annual about 1 month ago with pap/pelvic and breast exam and found out she was pregnant. Patient does intend to breast feed. Pregnancy history fully reviewed.  Patient reports no complaints.  Filed Vitals:   05/20/15 1335  BP: 134/82  Pulse: 108  Weight: 169 lb (76.658 kg)    HISTORY: OB History  Gravida Para Term Preterm AB SAB TAB Ectopic Multiple Living  6 2 2  3 3    2     # Outcome Date GA Lbr Len/2nd Weight Sex Delivery Anes PTL Lv  6 Current           5 Term 06/07/13 [redacted]w[redacted]d 07:33 / 00:31 6 lb 12.5 oz (3.075 kg) F Vag-Spont EPI  Y  4 Term 09/19/10 [redacted]w[redacted]d  6 lb 15 oz (3.147 kg) M Vag-Spont EPI  Y     Comments: PIH  3 SAB 2010          2 SAB 2010          1 SAB 2009             Past Medical History  Diagnosis Date  . Preeclampsia   . Pregnancy induced hypertension   . Pyelonephritis complicating pregnancy 03/2013  . Anemia   . Ovarian cyst   . Kidney stones    Past Surgical History  Procedure Laterality Date  . Tonsillectomy      2009  . Dilation and curettage of uterus     Family History  Problem Relation Age of Onset  . Cancer Mother     cervial dysplasia  . Ovarian cysts Mother   . Cancer Maternal Aunt     cervical  . Cancer Maternal Grandmother 40    Cervical and Breast one breast  . Ovarian cysts Maternal Grandmother   . Heart disease Maternal Grandmother   . Diabetes Maternal Grandfather   . Heart disease Maternal Grandfather      Exam     Skin: normal coloration and turgor, no rashes    Neurologic: oriented   Extremities: normal strength, tone, and muscle mass   HEENT extra ocular movement intact and sclera clear, anicteric   Mouth/Teeth mucous membranes moist, pharynx normal without lesions and dental hygiene good   Neck supple   Cardiovascular: regular rate  and rhythm, no murmurs or gallops   Respiratory:  appears well, vitals normal, no respiratory distress, acyanotic, normal RR, ear and throat exam is normal, neck free of mass or lymphadenopathy, chest clear, no wheezing, crepitations, rhonchi, normal symmetric air entry   Abdomen: soft, non-tender; bowel sounds normal; no masses,  no organomegaly      Assessment:    Pregnancy: [id number] Patient Active Problem List   Diagnosis Date Noted  . Supervision of high-risk pregnancy 11/15/2012    Priority: High  . Hx of preeclampsia, prior pregnancy, currently pregnant 11/15/2012    Priority: Medium  . Rh negative state in antepartum period 11/01/2012    Priority: Medium  . Nephrolithiasis 05/09/2013  . Pyelonephritis 03/16/2013  . Hydronephrosis, right 03/16/2013  . Cholelithiasis         Plan:     Initial labs drawn. Prenatal vitamins. Problem list reviewed and updated. Genetic Screening discussed First Screen: ordered. She is on a waiting list for this Ultrasound discussed; fetal survey: discussed. Begin Baby  ASA Follow up in 4 weeks.    Angel Pratt S 05/20/2015

## 2015-05-20 NOTE — Progress Notes (Deleted)
Hx cleft lip

## 2015-05-20 NOTE — Patient Instructions (Signed)
First Trimester of Pregnancy The first trimester of pregnancy is from week 1 until the end of week 12 (months 1 through 3). A week after a sperm fertilizes an egg, the egg will implant on the wall of the uterus. This embryo will begin to develop into a baby. Genes from you and your partner are forming the baby. The female genes determine whether the baby is a boy or a girl. At 6-8 weeks, the eyes and face are formed, and the heartbeat can be seen on ultrasound. At the end of 12 weeks, all the baby's organs are formed.  Now that you are pregnant, you will want to do everything you can to have a healthy baby. Two of the most important things are to get good prenatal care and to follow your health care provider's instructions. Prenatal care is all the medical care you receive before the baby's birth. This care will help prevent, find, and treat any problems during the pregnancy and childbirth. BODY CHANGES Your body goes through many changes during pregnancy. The changes vary from woman to woman.   You may gain or lose a couple of pounds at first.  You may feel sick to your stomach (nauseous) and throw up (vomit). If the vomiting is uncontrollable, call your health care provider.  You may tire easily.  You may develop headaches that can be relieved by medicines approved by your health care provider.  You may urinate more often. Painful urination may mean you have a bladder infection.  You may develop heartburn as a result of your pregnancy.  You may develop constipation because certain hormones are causing the muscles that push waste through your intestines to slow down.  You may develop hemorrhoids or swollen, bulging veins (varicose veins).  Your breasts may begin to grow larger and become tender. Your nipples may stick out more, and the tissue that surrounds them (areola) may become darker.  Your gums may bleed and may be sensitive to brushing and flossing.  Dark spots or blotches  (chloasma, mask of pregnancy) may develop on your face. This will likely fade after the baby is born.  Your menstrual periods will stop.  You may have a loss of appetite.  You may develop cravings for certain kinds of food.  You may have changes in your emotions from day to day, such as being excited to be pregnant or being concerned that something may go wrong with the pregnancy and baby.  You may have more vivid and strange dreams.  You may have changes in your hair. These can include thickening of your hair, rapid growth, and changes in texture. Some women also have hair loss during or after pregnancy, or hair that feels dry or thin. Your hair will most likely return to normal after your baby is born. WHAT TO EXPECT AT YOUR PRENATAL VISITS During a routine prenatal visit:  You will be weighed to make sure you and the baby are growing normally.  Your blood pressure will be taken.  Your abdomen will be measured to track your baby's growth.  The fetal heartbeat will be listened to starting around week 10 or 12 of your pregnancy.  Test results from any previous visits will be discussed. Your health care provider may ask you:  How you are feeling.  If you are feeling the baby move.  If you have had any abnormal symptoms, such as leaking fluid, bleeding, severe headaches, or abdominal cramping.  If you have any questions. Other tests   that may be performed during your first trimester include:  Blood tests to find your blood type and to check for the presence of any previous infections. They will also be used to check for low iron levels (anemia) and Rh antibodies. Later in the pregnancy, blood tests for diabetes will be done along with other tests if problems develop.  Urine tests to check for infections, diabetes, or protein in the urine.  An ultrasound to confirm the proper growth and development of the baby.  An amniocentesis to check for possible genetic problems.  Fetal  screens for spina bifida and Down syndrome.  You may need other tests to make sure you and the baby are doing well. HOME CARE INSTRUCTIONS  Medicines  Follow your health care provider's instructions regarding medicine use. Specific medicines may be either safe or unsafe to take during pregnancy.  Take your prenatal vitamins as directed.  If you develop constipation, try taking a stool softener if your health care provider approves. Diet  Eat regular, well-balanced meals. Choose a variety of foods, such as meat or vegetable-based protein, fish, milk and low-fat dairy products, vegetables, fruits, and whole grain breads and cereals. Your health care provider will help you determine the amount of weight gain that is right for you.  Avoid raw meat and uncooked cheese. These carry germs that can cause birth defects in the baby.  Eating four or five small meals rather than three large meals a day may help relieve nausea and vomiting. If you start to feel nauseous, eating a few soda crackers can be helpful. Drinking liquids between meals instead of during meals also seems to help nausea and vomiting.  If you develop constipation, eat more high-fiber foods, such as fresh vegetables or fruit and whole grains. Drink enough fluids to keep your urine clear or pale yellow. Activity and Exercise  Exercise only as directed by your health care provider. Exercising will help you:  Control your weight.  Stay in shape.  Be prepared for labor and delivery.  Experiencing pain or cramping in the lower abdomen or low back is a good sign that you should stop exercising. Check with your health care provider before continuing normal exercises.  Try to avoid standing for long periods of time. Move your legs often if you must stand in one place for a long time.  Avoid heavy lifting.  Wear low-heeled shoes, and practice good posture.  You may continue to have sex unless your health care provider directs you  otherwise. Relief of Pain or Discomfort  Wear a good support bra for breast tenderness.   Take warm sitz baths to soothe any pain or discomfort caused by hemorrhoids. Use hemorrhoid cream if your health care provider approves.   Rest with your legs elevated if you have leg cramps or low back pain.  If you develop varicose veins in your legs, wear support hose. Elevate your feet for 15 minutes, 3-4 times a day. Limit salt in your diet. Prenatal Care  Schedule your prenatal visits by the twelfth week of pregnancy. They are usually scheduled monthly at first, then more often in the last 2 months before delivery.  Write down your questions. Take them to your prenatal visits.  Keep all your prenatal visits as directed by your health care provider. Safety  Wear your seat belt at all times when driving.  Make a list of emergency phone numbers, including numbers for family, friends, the hospital, and police and fire departments. General Tips    Ask your health care provider for a referral to a local prenatal education class. Begin classes no later than at the beginning of month 6 of your pregnancy.  Ask for help if you have counseling or nutritional needs during pregnancy. Your health care provider can offer advice or refer you to specialists for help with various needs.  Do not use hot tubs, steam rooms, or saunas.  Do not douche or use tampons or scented sanitary pads.  Do not cross your legs for long periods of time.  Avoid cat litter boxes and soil used by cats. These carry germs that can cause birth defects in the baby and possibly loss of the fetus by miscarriage or stillbirth.  Avoid all smoking, herbs, alcohol, and medicines not prescribed by your health care provider. Chemicals in these affect the formation and growth of the baby.  Schedule a dentist appointment. At home, brush your teeth with a soft toothbrush and be gentle when you floss. SEEK MEDICAL CARE IF:   You have  dizziness.  You have mild pelvic cramps, pelvic pressure, or nagging pain in the abdominal area.  You have persistent nausea, vomiting, or diarrhea.  You have a bad smelling vaginal discharge.  You have pain with urination.  You notice increased swelling in your face, hands, legs, or ankles. SEEK IMMEDIATE MEDICAL CARE IF:   You have a fever.  You are leaking fluid from your vagina.  You have spotting or bleeding from your vagina.  You have severe abdominal cramping or pain.  You have rapid weight gain or loss.  You vomit blood or material that looks like coffee grounds.  You are exposed to German measles and have never had them.  You are exposed to fifth disease or chickenpox.  You develop a severe headache.  You have shortness of breath.  You have any kind of trauma, such as from a fall or a car accident. Document Released: 09/15/2001 Document Revised: 02/05/2014 Document Reviewed: 08/01/2013 [employer] Patient Information 2015 [employer]. This information is not intended to replace advice given to you by your health care provider. Make sure you discuss any questions you have with your health care provider.  Breastfeeding Deciding to breastfeed is one of the best choices you can make for you and your baby. A change in hormones during pregnancy causes your breast tissue to grow and increases the number and size of your milk ducts. These hormones also allow proteins, sugars, and fats from your blood supply to make breast milk in your milk-producing glands. Hormones prevent breast milk from being released before your baby is born as well as prompt milk flow after birth. Once breastfeeding has begun, thoughts of your baby, as well as his or her sucking or crying, can stimulate the release of milk from your milk-producing glands.  BENEFITS OF BREASTFEEDING For Your Baby  Your first milk (colostrum) helps your baby's digestive system function better.   There are antibodies  in your milk that help your baby fight off infections.   Your baby has a lower incidence of asthma, allergies, and sudden infant death syndrome.   The nutrients in breast milk are better for your baby than infant formulas and are designed uniquely for your baby's needs.   Breast milk improves your baby's brain development.   Your baby is less likely to develop other conditions, such as childhood obesity, asthma, or type 2 diabetes mellitus.  For You   Breastfeeding helps to create a very special bond between   you and your baby.   Breastfeeding is convenient. Breast milk is always available at the correct temperature and costs nothing.   Breastfeeding helps to burn calories and helps you lose the weight gained during pregnancy.   Breastfeeding makes your uterus contract to its prepregnancy size faster and slows bleeding (lochia) after you give birth.   Breastfeeding helps to lower your risk of developing type 2 diabetes mellitus, osteoporosis, and breast or ovarian cancer later in life. SIGNS THAT YOUR BABY IS HUNGRY Early Signs of Hunger  Increased alertness or activity.  Stretching.  Movement of the head from side to side.  Movement of the head and opening of the mouth when the corner of the mouth or cheek is stroked (rooting).  Increased sucking sounds, smacking lips, cooing, sighing, or squeaking.  Hand-to-mouth movements.  Increased sucking of fingers or hands. Late Signs of Hunger  Fussing.  Intermittent crying. Extreme Signs of Hunger Signs of extreme hunger will require calming and consoling before your baby will be able to breastfeed successfully. Do not wait for the following signs of extreme hunger to occur before you initiate breastfeeding:   Restlessness.  A loud, strong cry.   Screaming. BREASTFEEDING BASICS Breastfeeding Initiation  Find a comfortable place to sit or lie down, with your neck and back well supported.  Place a pillow or  rolled up blanket under your baby to bring him or her to the level of your breast (if you are seated). Nursing pillows are specially designed to help support your arms and your baby while you breastfeed.  Make sure that your baby's abdomen is facing your abdomen.   Gently massage your breast. With your fingertips, massage from your chest wall toward your nipple in a circular motion. This encourages milk flow. You may need to continue this action during the feeding if your milk flows slowly.  Support your breast with 4 fingers underneath and your thumb above your nipple. Make sure your fingers are well away from your nipple and your baby's mouth.   Stroke your baby's lips gently with your finger or nipple.   When your baby's mouth is open wide enough, quickly bring your baby to your breast, placing your entire nipple and as much of the colored area around your nipple (areola) as possible into your baby's mouth.   More areola should be visible above your baby's upper lip than below the lower lip.   Your baby's tongue should be between his or her lower gum and your breast.   Ensure that your baby's mouth is correctly positioned around your nipple (latched). Your baby's lips should create a seal on your breast and be turned out (everted).  It is common for your baby to suck about 2-3 minutes in order to start the flow of breast milk. Latching Teaching your baby how to latch on to your breast properly is very important. An improper latch can cause nipple pain and decreased milk supply for you and poor weight gain in your baby. Also, if your baby is not latched onto your nipple properly, he or she may swallow some air during feeding. This can make your baby fussy. Burping your baby when you switch breasts during the feeding can help to get rid of the air. However, teaching your baby to latch on properly is still the best way to prevent fussiness from swallowing air while breastfeeding. Signs  that your baby has successfully latched on to your nipple:    Silent tugging or silent   sucking, without causing you pain.   Swallowing heard between every 3-4 sucks.    Muscle movement above and in front of his or her ears while sucking.  Signs that your baby has not successfully latched on to nipple:   Sucking sounds or smacking sounds from your baby while breastfeeding.  Nipple pain. If you think your baby has not latched on correctly, slip your finger into the corner of your baby's mouth to break the suction and place it between your baby's gums. Attempt breastfeeding initiation again. Signs of Successful Breastfeeding Signs from your baby:   A gradual decrease in the number of sucks or complete cessation of sucking.   Falling asleep.   Relaxation of his or her body.   Retention of a small amount of milk in his or her mouth.   Letting go of your breast by himself or herself. Signs from you:  Breasts that have increased in firmness, weight, and size 1-3 hours after feeding.   Breasts that are softer immediately after breastfeeding.  Increased milk volume, as well as a change in milk consistency and color by the fifth day of breastfeeding.   Nipples that are not sore, cracked, or bleeding. Signs That Your Baby is Getting Enough Milk  Wetting at least 3 diapers in a 24-hour period. The urine should be clear and pale yellow by age 5 days.  At least 3 stools in a 24-hour period by age 5 days. The stool should be soft and yellow.  At least 3 stools in a 24-hour period by age 7 days. The stool should be seedy and yellow.  No loss of weight greater than 10% of birth weight during the first 3 days of age.  Average weight gain of 4-7 ounces (113-198 g) per week after age 4 days.  Consistent daily weight gain by age 5 days, without weight loss after the age of 2 weeks. After a feeding, your baby may spit up a small amount. This is common. BREASTFEEDING FREQUENCY AND  DURATION Frequent feeding will help you make more milk and can prevent sore nipples and breast engorgement. Breastfeed when you feel the need to reduce the fullness of your breasts or when your baby shows signs of hunger. This is called "breastfeeding on demand." Avoid introducing a pacifier to your baby while you are working to establish breastfeeding (the first 4-6 weeks after your baby is born). After this time you may choose to use a pacifier. Research has shown that pacifier use during the first year of a baby's life decreases the risk of sudden infant death syndrome (SIDS). Allow your baby to feed on each breast as long as he or she wants. Breastfeed until your baby is finished feeding. When your baby unlatches or falls asleep while feeding from the first breast, offer the second breast. Because newborns are often sleepy in the first few weeks of life, you may need to awaken your baby to get him or her to feed. Breastfeeding times will vary from baby to baby. However, the following rules can serve as a guide to help you ensure that your baby is properly fed:  Newborns (babies 4 weeks of age or younger) may breastfeed every 1-3 hours.  Newborns should not go longer than 3 hours during the day or 5 hours during the night without breastfeeding.  You should breastfeed your baby a minimum of 8 times in a 24-hour period until you begin to introduce solid foods to your baby at around 6   months of age. BREAST MILK PUMPING Pumping and storing breast milk allows you to ensure that your baby is exclusively fed your breast milk, even at times when you are unable to breastfeed. This is especially important if you are going back to work while you are still breastfeeding or when you are not able to be present during feedings. Your lactation consultant can give you guidelines on how long it is safe to store breast milk.  A breast pump is a machine that allows you to pump milk from your breast into a sterile bottle.  The pumped breast milk can then be stored in a refrigerator or freezer. Some breast pumps are operated by hand, while others use electricity. Ask your lactation consultant which type will work best for you. Breast pumps can be purchased, but some hospitals and breastfeeding support groups lease breast pumps on a monthly basis. A lactation consultant can teach you how to hand express breast milk, if you prefer not to use a pump.  CARING FOR YOUR BREASTS WHILE YOU BREASTFEED Nipples can become dry, cracked, and sore while breastfeeding. The following recommendations can help keep your breasts moisturized and healthy:  Avoid using soap on your nipples.   Wear a supportive bra. Although not required, special nursing bras and tank tops are designed to allow access to your breasts for breastfeeding without taking off your entire bra or top. Avoid wearing underwire-style bras or extremely tight bras.  Air dry your nipples for 3-4minutes after each feeding.   Use only cotton bra pads to absorb leaked breast milk. Leaking of breast milk between feedings is normal.   Use lanolin on your nipples after breastfeeding. Lanolin helps to maintain your skin's normal moisture barrier. If you use pure lanolin, you do not need to wash it off before feeding your baby again. Pure lanolin is not toxic to your baby. You may also hand express a few drops of breast milk and gently massage that milk into your nipples and allow the milk to air dry. In the first few weeks after giving birth, some women experience extremely full breasts (engorgement). Engorgement can make your breasts feel heavy, warm, and tender to the touch. Engorgement peaks within 3-5 days after you give birth. The following recommendations can help ease engorgement:  Completely empty your breasts while breastfeeding or pumping. You may want to start by applying warm, moist heat (in the shower or with warm water-soaked hand towels) just before feeding or  pumping. This increases circulation and helps the milk flow. If your baby does not completely empty your breasts while breastfeeding, pump any extra milk after he or she is finished.  Wear a snug bra (nursing or regular) or tank top for 1-2 days to signal your body to slightly decrease milk production.  Apply ice packs to your breasts, unless this is too uncomfortable for you.  Make sure that your baby is latched on and positioned properly while breastfeeding. If engorgement persists after 48 hours of following these recommendations, contact your health care provider or a lactation consultant. OVERALL HEALTH CARE RECOMMENDATIONS WHILE BREASTFEEDING  Eat healthy foods. Alternate between meals and snacks, eating 3 of each per day. Because what you eat affects your breast milk, some of the foods may make your baby more irritable than usual. Avoid eating these foods if you are sure that they are negatively affecting your baby.  Drink milk, fruit juice, and water to satisfy your thirst (about 10 glasses a day).   Rest   often, relax, and continue to take your prenatal vitamins to prevent fatigue, stress, and anemia.  Continue breast self-awareness checks.  Avoid chewing and smoking tobacco.  Avoid alcohol and drug use. Some medicines that may be harmful to your baby can pass through breast milk. It is important to ask your health care provider before taking any medicine, including all over-the-counter and prescription medicine as well as vitamin and herbal supplements. It is possible to become pregnant while breastfeeding. If birth control is desired, ask your health care provider about options that will be safe for your baby. SEEK MEDICAL CARE IF:   You feel like you want to stop breastfeeding or have become frustrated with breastfeeding.  You have painful breasts or nipples.  Your nipples are cracked or bleeding.  Your breasts are red, tender, or warm.  You have a swollen area on either  breast.  You have a fever or chills.  You have nausea or vomiting.  You have drainage other than breast milk from your nipples.  Your breasts do not become full before feedings by the fifth day after you give birth.  You feel sad and depressed.  Your baby is too sleepy to eat well.  Your baby is having trouble sleeping.   Your baby is wetting less than 3 diapers in a 24-hour period.  Your baby has less than 3 stools in a 24-hour period.  Your baby's skin or the white part of his or her eyes becomes yellow.   Your baby is not gaining weight by 5 days of age. SEEK IMMEDIATE MEDICAL CARE IF:   Your baby is overly tired (lethargic) and does not want to wake up and feed.  Your baby develops an unexplained fever. Document Released: 09/21/2005 Document Revised: 09/26/2013 Document Reviewed: 03/15/2013 [employer] Patient Information 2015 [employer]. This information is not intended to replace advice given to you by your health care provider. Make sure you discuss any questions you have with your health care provider.  

## 2015-05-21 LAB — PRENATAL PROFILE (SOLSTAS)
Antibody Screen: NEGATIVE
Basophils Absolute: 0 10*3/uL (ref 0.0–0.1)
Basophils Relative: 0 % (ref 0–1)
EOS ABS: 0.3 10*3/uL (ref 0.0–0.7)
EOS PCT: 2 % (ref 0–5)
HCT: 39.5 % (ref 36.0–46.0)
HIV: NONREACTIVE
Hemoglobin: 13.3 g/dL (ref 12.0–15.0)
Hepatitis B Surface Ag: NEGATIVE
LYMPHS ABS: 2.4 10*3/uL (ref 0.7–4.0)
Lymphocytes Relative: 18 % (ref 12–46)
MCH: 26.3 pg (ref 26.0–34.0)
MCHC: 33.7 g/dL (ref 30.0–36.0)
MCV: 78.1 fL (ref 78.0–100.0)
MONO ABS: 0.5 10*3/uL (ref 0.1–1.0)
MPV: 9.8 fL (ref 8.6–12.4)
Monocytes Relative: 4 % (ref 3–12)
Neutro Abs: 10 10*3/uL — ABNORMAL HIGH (ref 1.7–7.7)
Neutrophils Relative %: 76 % (ref 43–77)
Platelets: 270 10*3/uL (ref 150–400)
RBC: 5.06 MIL/uL (ref 3.87–5.11)
RDW: 16.6 % — AB (ref 11.5–15.5)
RH TYPE: NEGATIVE
Rubella: 15.5 Index — ABNORMAL HIGH (ref <0.90)
WBC: 13.1 10*3/uL — AB (ref 4.0–10.5)

## 2015-05-21 LAB — GC/CHLAMYDIA PROBE AMP, URINE
Chlamydia, Swab/Urine, PCR: NEGATIVE
GC Probe Amp, Urine: NEGATIVE

## 2015-05-22 LAB — CULTURE, OB URINE

## 2015-05-23 ENCOUNTER — Encounter: Payer: Self-pay | Admitting: Family Medicine

## 2015-05-23 ENCOUNTER — Telehealth: Payer: Self-pay | Admitting: *Deleted

## 2015-05-23 DIAGNOSIS — O2340 Unspecified infection of urinary tract in pregnancy, unspecified trimester: Secondary | ICD-10-CM

## 2015-05-23 DIAGNOSIS — R8271 Bacteriuria: Secondary | ICD-10-CM

## 2015-05-23 DIAGNOSIS — B951 Streptococcus, group B, as the cause of diseases classified elsewhere: Secondary | ICD-10-CM | POA: Insufficient documentation

## 2015-05-23 MED ORDER — AMOXICILLIN 500 MG PO CAPS: 500.0000 mg | ORAL_CAPSULE | Freq: Three times a day (TID) | ORAL | Status: DC

## 2015-05-23 NOTE — Telephone Encounter (Signed)
-----   Message from Reva Bores, MD sent at 05/23/2015  8:52 AM EDT ----- gbs UTI--please treat with Amox 500 mg tid x 5 d

## 2015-05-23 NOTE — Telephone Encounter (Signed)
Spoke to pt about Urine cx results and the need for antibiotic treatment, sent rx to pharmacy.

## 2015-05-27 ENCOUNTER — Ambulatory Visit (HOSPITAL_COMMUNITY): Admission: RE | Admit: 2015-05-27 | Payer: 59 | Source: Ambulatory Visit

## 2015-05-27 ENCOUNTER — Encounter (HOSPITAL_COMMUNITY): Payer: Self-pay

## 2015-05-27 ENCOUNTER — Ambulatory Visit (HOSPITAL_COMMUNITY)
Admission: RE | Admit: 2015-05-27 | Discharge: 2015-05-27 | Disposition: A | Discharge status: Home / Self Care | Payer: 59 | Source: Ambulatory Visit | Attending: Family Medicine | Admitting: Family Medicine

## 2015-05-27 DIAGNOSIS — Z3A14 14 weeks gestation of pregnancy: Secondary | ICD-10-CM | POA: Diagnosis not present

## 2015-05-27 DIAGNOSIS — O0991 Supervision of high risk pregnancy, unspecified, first trimester: Secondary | ICD-10-CM | POA: Insufficient documentation

## 2015-05-27 DIAGNOSIS — O09299 Supervision of pregnancy with other poor reproductive or obstetric history, unspecified trimester: Secondary | ICD-10-CM | POA: Insufficient documentation

## 2015-06-18 ENCOUNTER — Encounter: Payer: Self-pay | Admitting: Family Medicine

## 2015-06-18 ENCOUNTER — Ambulatory Visit (INDEPENDENT_AMBULATORY_CARE_PROVIDER_SITE_OTHER): Payer: 59 | Admitting: Family Medicine

## 2015-06-18 ENCOUNTER — Encounter: Payer: 59 | Admitting: Family Medicine

## 2015-06-18 VITALS — BP 114/80 | HR 92 | Wt 172.0 lb

## 2015-06-18 DIAGNOSIS — Z23 Encounter for immunization: Secondary | ICD-10-CM | POA: Diagnosis not present

## 2015-06-18 DIAGNOSIS — Z36 Encounter for antenatal screening of mother: Secondary | ICD-10-CM

## 2015-06-18 DIAGNOSIS — O0992 Supervision of high risk pregnancy, unspecified, second trimester: Secondary | ICD-10-CM

## 2015-06-18 NOTE — Progress Notes (Signed)
Subjective:  Angel Pratt is a 24 y.o. [id number] at [redacted]w[redacted]d being seen today for ongoing prenatal care.  Patient reports no complaints.  Contractions: Not present.  Vag. Bleeding: None. Movement: Absent. Denies leaking of fluid.   The following portions of the patient's history were reviewed and updated as appropriate: allergies, current medications, past family history, past medical history, past social history, past surgical history and problem list.   Objective:   Filed Vitals:   06/18/15 0831  BP: 114/80  Pulse: 92  Weight: 172 lb (78.019 kg)    Fetal Status: Fetal Heart Rate (bpm): 150   Movement: Absent     General:  Alert, oriented and cooperative. Patient is in no acute distress.  Skin: Skin is warm and dry. No rash noted.   Cardiovascular: Normal heart rate noted  Respiratory: Normal respiratory effort, no problems with respiration noted  Abdomen: Soft, gravid, appropriate for gestational age. Pain/Pressure: Absent     Pelvic: Vag. Bleeding: None Vag D/C Character: Thin    Extremities: Normal range of motion.  Edema: None  Mental Status: Normal mood and affect. Normal behavior. Normal judgment and thought content.   Urinalysis: Urine Protein: Negative Urine Glucose: Negative  Assessment and Plan:  Pregnancy: [id number] at [redacted]w[redacted]d  1. Supervision of high-risk pregnancy, second trimester - AFP, Quad Screen - [place] MFM OB COMP + 14 WK; Future Continue routine prenatal care.   2. Need for influenza vaccination - Flu Vaccine QUAD 36+ mos IM  Preterm labor symptoms and general obstetric precautions including but not limited to vaginal bleeding, contractions, leaking of fluid and fetal movement were reviewed in detail with the patient. Please refer to After Visit Summary for other counseling recommendations.  Return in 4 weeks (on 07/16/2015).   Reva Bores, MD

## 2015-06-18 NOTE — Patient Instructions (Signed)
Second Trimester of Pregnancy The second trimester is from week 13 through week 28, months 4 through 6. The second trimester is often a time when you feel your best. Your body has also adjusted to being pregnant, and you begin to feel better physically. Usually, morning sickness has lessened or quit completely, you may have more energy, and you may have an increase in appetite. The second trimester is also a time when the fetus is growing rapidly. At the end of the sixth month, the fetus is about 9 inches long and weighs about 1 pounds. You will likely begin to feel the baby move (quickening) between 18 and 20 weeks of the pregnancy. BODY CHANGES Your body goes through many changes during pregnancy. The changes vary from woman to woman.   Your weight will continue to increase. You will notice your lower abdomen bulging out.  You may begin to get stretch marks on your hips, abdomen, and breasts.  You may develop headaches that can be relieved by medicines approved by your health care provider.  You may urinate more often because the fetus is pressing on your bladder.  You may develop or continue to have heartburn as a result of your pregnancy.  You may develop constipation because certain hormones are causing the muscles that push waste through your intestines to slow down.  You may develop hemorrhoids or swollen, bulging veins (varicose veins).  You may have back pain because of the weight gain and pregnancy hormones relaxing your joints between the bones in your pelvis and as a result of a shift in weight and the muscles that support your balance.  Your breasts will continue to grow and be tender.  Your gums may bleed and may be sensitive to brushing and flossing.  Dark spots or blotches (chloasma, mask of pregnancy) may develop on your face. This will likely fade after the baby is born.  A dark line from your belly button to the pubic area (linea nigra) may appear. This will likely  fade after the baby is born.  You may have changes in your hair. These can include thickening of your hair, rapid growth, and changes in texture. Some women also have hair loss during or after pregnancy, or hair that feels dry or thin. Your hair will most likely return to normal after your baby is born. WHAT TO EXPECT AT YOUR PRENATAL VISITS During a routine prenatal visit:  You will be weighed to make sure you and the fetus are growing normally.  Your blood pressure will be taken.  Your abdomen will be measured to track your baby's growth.  The fetal heartbeat will be listened to.  Any test results from the previous visit will be discussed. Your health care provider may ask you:  How you are feeling.  If you are feeling the baby move.  If you have had any abnormal symptoms, such as leaking fluid, bleeding, severe headaches, or abdominal cramping.  If you have any questions. Other tests that may be performed during your second trimester include:  Blood tests that check for:  Low iron levels (anemia).  Gestational diabetes (between 24 and 28 weeks).  Rh antibodies.  Urine tests to check for infections, diabetes, or protein in the urine.  An ultrasound to confirm the proper growth and development of the baby.  An amniocentesis to check for possible genetic problems.  Fetal screens for spina bifida and Down syndrome. HOME CARE INSTRUCTIONS   Avoid all smoking, herbs, alcohol, and unprescribed   drugs. These chemicals affect the formation and growth of the baby.  Follow your health care provider's instructions regarding medicine use. There are medicines that are either safe or unsafe to take during pregnancy.  Exercise only as directed by your health care provider. Experiencing uterine cramps is a good sign to stop exercising.  Continue to eat regular, healthy meals.  Wear a good support bra for breast tenderness.  Do not use hot tubs, steam rooms, or saunas.  Wear  your seat belt at all times when driving.  Avoid raw meat, uncooked cheese, cat litter boxes, and soil used by cats. These carry germs that can cause birth defects in the baby.  Take your prenatal vitamins.  Try taking a stool softener (if your health care provider approves) if you develop constipation. Eat more high-fiber foods, such as fresh vegetables or fruit and whole grains. Drink plenty of fluids to keep your urine clear or pale yellow.  Take warm sitz baths to soothe any pain or discomfort caused by hemorrhoids. Use hemorrhoid cream if your health care provider approves.  If you develop varicose veins, wear support hose. Elevate your feet for 15 minutes, 3-4 times a day. Limit salt in your diet.  Avoid heavy lifting, wear low heel shoes, and practice good posture.  Rest with your legs elevated if you have leg cramps or low back pain.  Visit your dentist if you have not gone yet during your pregnancy. Use a soft toothbrush to brush your teeth and be gentle when you floss.  A sexual relationship may be continued unless your health care provider directs you otherwise.  Continue to go to all your prenatal visits as directed by your health care provider. SEEK MEDICAL CARE IF:   You have dizziness.  You have mild pelvic cramps, pelvic pressure, or nagging pain in the abdominal area.  You have persistent nausea, vomiting, or diarrhea.  You have a bad smelling vaginal discharge.  You have pain with urination. SEEK IMMEDIATE MEDICAL CARE IF:   You have a fever.  You are leaking fluid from your vagina.  You have spotting or bleeding from your vagina.  You have severe abdominal cramping or pain.  You have rapid weight gain or loss.  You have shortness of breath with chest pain.  You notice sudden or extreme swelling of your face, hands, ankles, feet, or legs.  You have not felt your baby move in over an hour.  You have severe headaches that do not go away with  medicine.  You have vision changes. Document Released: 09/15/2001 Document Revised: 09/26/2013 Document Reviewed: 11/22/2012 [employer] Patient Information 2015 [employer]. This information is not intended to replace advice given to you by your health care provider. Make sure you discuss any questions you have with your health care provider.  Breastfeeding Deciding to breastfeed is one of the best choices you can make for you and your baby. A change in hormones during pregnancy causes your breast tissue to grow and increases the number and size of your milk ducts. These hormones also allow proteins, sugars, and fats from your blood supply to make breast milk in your milk-producing glands. Hormones prevent breast milk from being released before your baby is born as well as prompt milk flow after birth. Once breastfeeding has begun, thoughts of your baby, as well as his or her sucking or crying, can stimulate the release of milk from your milk-producing glands.  BENEFITS OF BREASTFEEDING For Your Baby  Your first   milk (colostrum) helps your baby's digestive system function better.   There are antibodies in your milk that help your baby fight off infections.   Your baby has a lower incidence of asthma, allergies, and sudden infant death syndrome.   The nutrients in breast milk are better for your baby than infant formulas and are designed uniquely for your baby's needs.   Breast milk improves your baby's brain development.   Your baby is less likely to develop other conditions, such as childhood obesity, asthma, or type 2 diabetes mellitus.  For You   Breastfeeding helps to create a very special bond between you and your baby.   Breastfeeding is convenient. Breast milk is always available at the correct temperature and costs nothing.   Breastfeeding helps to burn calories and helps you lose the weight gained during pregnancy.   Breastfeeding makes your uterus contract to its  prepregnancy size faster and slows bleeding (lochia) after you give birth.   Breastfeeding helps to lower your risk of developing type 2 diabetes mellitus, osteoporosis, and breast or ovarian cancer later in life. SIGNS THAT YOUR BABY IS HUNGRY Early Signs of Hunger  Increased alertness or activity.  Stretching.  Movement of the head from side to side.  Movement of the head and opening of the mouth when the corner of the mouth or cheek is stroked (rooting).  Increased sucking sounds, smacking lips, cooing, sighing, or squeaking.  Hand-to-mouth movements.  Increased sucking of fingers or hands. Late Signs of Hunger  Fussing.  Intermittent crying. Extreme Signs of Hunger Signs of extreme hunger will require calming and consoling before your baby will be able to breastfeed successfully. Do not wait for the following signs of extreme hunger to occur before you initiate breastfeeding:   Restlessness.  A loud, strong cry.   Screaming. BREASTFEEDING BASICS Breastfeeding Initiation  Find a comfortable place to sit or lie down, with your neck and back well supported.  Place a pillow or rolled up blanket under your baby to bring him or her to the level of your breast (if you are seated). Nursing pillows are specially designed to help support your arms and your baby while you breastfeed.  Make sure that your baby's abdomen is facing your abdomen.   Gently massage your breast. With your fingertips, massage from your chest wall toward your nipple in a circular motion. This encourages milk flow. You may need to continue this action during the feeding if your milk flows slowly.  Support your breast with 4 fingers underneath and your thumb above your nipple. Make sure your fingers are well away from your nipple and your baby's mouth.   Stroke your baby's lips gently with your finger or nipple.   When your baby's mouth is open wide enough, quickly bring your baby to your breast,  placing your entire nipple and as much of the colored area around your nipple (areola) as possible into your baby's mouth.   More areola should be visible above your baby's upper lip than below the lower lip.   Your baby's tongue should be between his or her lower gum and your breast.   Ensure that your baby's mouth is correctly positioned around your nipple (latched). Your baby's lips should create a seal on your breast and be turned out (everted).  It is common for your baby to suck about 2-3 minutes in order to start the flow of breast milk. Latching Teaching your baby how to latch on to your breast   properly is very important. An improper latch can cause nipple pain and decreased milk supply for you and poor weight gain in your baby. Also, if your baby is not latched onto your nipple properly, he or she may swallow some air during feeding. This can make your baby fussy. Burping your baby when you switch breasts during the feeding can help to get rid of the air. However, teaching your baby to latch on properly is still the best way to prevent fussiness from swallowing air while breastfeeding. Signs that your baby has successfully latched on to your nipple:    Silent tugging or silent sucking, without causing you pain.   Swallowing heard between every 3-4 sucks.    Muscle movement above and in front of his or her ears while sucking.  Signs that your baby has not successfully latched on to nipple:   Sucking sounds or smacking sounds from your baby while breastfeeding.  Nipple pain. If you think your baby has not latched on correctly, slip your finger into the corner of your baby's mouth to break the suction and place it between your baby's gums. Attempt breastfeeding initiation again. Signs of Successful Breastfeeding Signs from your baby:   A gradual decrease in the number of sucks or complete cessation of sucking.   Falling asleep.   Relaxation of his or her body.    Retention of a small amount of milk in his or her mouth.   Letting go of your breast by himself or herself. Signs from you:  Breasts that have increased in firmness, weight, and size 1-3 hours after feeding.   Breasts that are softer immediately after breastfeeding.  Increased milk volume, as well as a change in milk consistency and color by the fifth day of breastfeeding.   Nipples that are not sore, cracked, or bleeding. Signs That Your Baby is Getting Enough Milk  Wetting at least 3 diapers in a 24-hour period. The urine should be clear and pale yellow by age 5 days.  At least 3 stools in a 24-hour period by age 5 days. The stool should be soft and yellow.  At least 3 stools in a 24-hour period by age 7 days. The stool should be seedy and yellow.  No loss of weight greater than 10% of birth weight during the first 3 days of age.  Average weight gain of 4-7 ounces (113-198 g) per week after age 4 days.  Consistent daily weight gain by age 5 days, without weight loss after the age of 2 weeks. After a feeding, your baby may spit up a small amount. This is common. BREASTFEEDING FREQUENCY AND DURATION Frequent feeding will help you make more milk and can prevent sore nipples and breast engorgement. Breastfeed when you feel the need to reduce the fullness of your breasts or when your baby shows signs of hunger. This is called "breastfeeding on demand." Avoid introducing a pacifier to your baby while you are working to establish breastfeeding (the first 4-6 weeks after your baby is born). After this time you may choose to use a pacifier. Research has shown that pacifier use during the first year of a baby's life decreases the risk of sudden infant death syndrome (SIDS). Allow your baby to feed on each breast as long as he or she wants. Breastfeed until your baby is finished feeding. When your baby unlatches or falls asleep while feeding from the first breast, offer the second breast.  Because newborns are often sleepy in the   first few weeks of life, you may need to awaken your baby to get him or her to feed. Breastfeeding times will vary from baby to baby. However, the following rules can serve as a guide to help you ensure that your baby is properly fed:  Newborns (babies 4 weeks of age or younger) may breastfeed every 1-3 hours.  Newborns should not go longer than 3 hours during the day or 5 hours during the night without breastfeeding.  You should breastfeed your baby a minimum of 8 times in a 24-hour period until you begin to introduce solid foods to your baby at around 6 months of age. BREAST MILK PUMPING Pumping and storing breast milk allows you to ensure that your baby is exclusively fed your breast milk, even at times when you are unable to breastfeed. This is especially important if you are going back to work while you are still breastfeeding or when you are not able to be present during feedings. Your lactation consultant can give you guidelines on how long it is safe to store breast milk.  A breast pump is a machine that allows you to pump milk from your breast into a sterile bottle. The pumped breast milk can then be stored in a refrigerator or freezer. Some breast pumps are operated by hand, while others use electricity. Ask your lactation consultant which type will work best for you. Breast pumps can be purchased, but some hospitals and breastfeeding support groups lease breast pumps on a monthly basis. A lactation consultant can teach you how to hand express breast milk, if you prefer not to use a pump.  CARING FOR YOUR BREASTS WHILE YOU BREASTFEED Nipples can become dry, cracked, and sore while breastfeeding. The following recommendations can help keep your breasts moisturized and healthy:  Avoid using soap on your nipples.   Wear a supportive bra. Although not required, special nursing bras and tank tops are designed to allow access to your breasts for  breastfeeding without taking off your entire bra or top. Avoid wearing underwire-style bras or extremely tight bras.  Air dry your nipples for 3-4minutes after each feeding.   Use only cotton bra pads to absorb leaked breast milk. Leaking of breast milk between feedings is normal.   Use lanolin on your nipples after breastfeeding. Lanolin helps to maintain your skin's normal moisture barrier. If you use pure lanolin, you do not need to wash it off before feeding your baby again. Pure lanolin is not toxic to your baby. You may also hand express a few drops of breast milk and gently massage that milk into your nipples and allow the milk to air dry. In the first few weeks after giving birth, some women experience extremely full breasts (engorgement). Engorgement can make your breasts feel heavy, warm, and tender to the touch. Engorgement peaks within 3-5 days after you give birth. The following recommendations can help ease engorgement:  Completely empty your breasts while breastfeeding or pumping. You may want to start by applying warm, moist heat (in the shower or with warm water-soaked hand towels) just before feeding or pumping. This increases circulation and helps the milk flow. If your baby does not completely empty your breasts while breastfeeding, pump any extra milk after he or she is finished.  Wear a snug bra (nursing or regular) or tank top for 1-2 days to signal your body to slightly decrease milk production.  Apply ice packs to your breasts, unless this is too uncomfortable for you.    Make sure that your baby is latched on and positioned properly while breastfeeding. If engorgement persists after 48 hours of following these recommendations, contact your health care provider or a lactation consultant. OVERALL HEALTH CARE RECOMMENDATIONS WHILE BREASTFEEDING  Eat healthy foods. Alternate between meals and snacks, eating 3 of each per day. Because what you eat affects your breast milk,  some of the foods may make your baby more irritable than usual. Avoid eating these foods if you are sure that they are negatively affecting your baby.  Drink milk, fruit juice, and water to satisfy your thirst (about 10 glasses a day).   Rest often, relax, and continue to take your prenatal vitamins to prevent fatigue, stress, and anemia.  Continue breast self-awareness checks.  Avoid chewing and smoking tobacco.  Avoid alcohol and drug use. Some medicines that may be harmful to your baby can pass through breast milk. It is important to ask your health care provider before taking any medicine, including all over-the-counter and prescription medicine as well as vitamin and herbal supplements. It is possible to become pregnant while breastfeeding. If birth control is desired, ask your health care provider about options that will be safe for your baby. SEEK MEDICAL CARE IF:   You feel like you want to stop breastfeeding or have become frustrated with breastfeeding.  You have painful breasts or nipples.  Your nipples are cracked or bleeding.  Your breasts are red, tender, or warm.  You have a swollen area on either breast.  You have a fever or chills.  You have nausea or vomiting.  You have drainage other than breast milk from your nipples.  Your breasts do not become full before feedings by the fifth day after you give birth.  You feel sad and depressed.  Your baby is too sleepy to eat well.  Your baby is having trouble sleeping.   Your baby is wetting less than 3 diapers in a 24-hour period.  Your baby has less than 3 stools in a 24-hour period.  Your baby's skin or the white part of his or her eyes becomes yellow.   Your baby is not gaining weight by 5 days of age. SEEK IMMEDIATE MEDICAL CARE IF:   Your baby is overly tired (lethargic) and does not want to wake up and feed.  Your baby develops an unexplained fever. Document Released: 09/21/2005 Document Revised:  09/26/2013 Document Reviewed: 03/15/2013 [employer] Patient Information 2015 [employer]. This information is not intended to replace advice given to you by your health care provider. Make sure you discuss any questions you have with your health care provider.  

## 2015-06-21 LAB — AFP, QUAD SCREEN
AFP: 48.6 ng/mL
Age Alone: 1:986 {titer}
CURR GEST AGE: 17.6 wks.days
Down Syndrome Scr Risk Est: <1:38500 {titer}
HCG TOTAL: 15.84 [IU]/mL
INH: 112.2 pg/mL
Interpretation-AFP: NEGATIVE
MOM FOR INH: 0.74
MoM for AFP: 1.22
MoM for hCG: 0.6
OPEN SPINA BIFIDA: NEGATIVE
Osb Risk: 1:6530 {titer}
Tri 18 Scr Risk Est: NEGATIVE
Trisomy 18 (Edward) Syndrome Interp.: 1:49200 {titer}
UE3 MOM: 1.2
uE3 Value: 1.41 ng/mL

## 2015-07-01 ENCOUNTER — Ambulatory Visit (HOSPITAL_COMMUNITY)
Admission: RE | Admit: 2015-07-01 | Discharge: 2015-07-01 | Disposition: A | Discharge status: Home / Self Care | Payer: 59 | Source: Ambulatory Visit | Attending: Family Medicine | Admitting: Family Medicine

## 2015-07-01 DIAGNOSIS — O0992 Supervision of high risk pregnancy, unspecified, second trimester: Secondary | ICD-10-CM | POA: Diagnosis not present

## 2015-07-15 ENCOUNTER — Ambulatory Visit (INDEPENDENT_AMBULATORY_CARE_PROVIDER_SITE_OTHER): Payer: 59 | Admitting: Obstetrics & Gynecology

## 2015-07-15 VITALS — BP 116/82 | HR 97 | Wt 181.0 lb

## 2015-07-15 DIAGNOSIS — R635 Abnormal weight gain: Secondary | ICD-10-CM

## 2015-07-15 DIAGNOSIS — O0992 Supervision of high risk pregnancy, unspecified, second trimester: Secondary | ICD-10-CM

## 2015-07-15 NOTE — Progress Notes (Signed)
Subjective:  Angel Pratt is a 24 y.o. [id number] at [redacted]w[redacted]d being seen today for ongoing prenatal care.  Patient reports some recent cramping but no bleeding. She is also concerned about her weight gain..  Contractions: Not present.  Vag. Bleeding: None. Movement: Absent. Denies leaking of fluid.   The following portions of the patient's history were reviewed and updated as appropriate: allergies, current medications, past family history, past medical history, past social history, past surgical history and problem list.   Objective:   Filed Vitals:   07/15/15 1439  BP: 116/82  Pulse: 97  Weight: 181 lb (82.101 kg)    Fetal Status: Fetal Heart Rate (bpm): 147   Movement: Absent     General:  Alert, oriented and cooperative. Patient is in no acute distress.  Skin: Skin is warm and dry. No rash noted.   Cardiovascular: Normal heart rate noted  Respiratory: Normal respiratory effort, no problems with respiration noted  Abdomen: Soft, gravid, appropriate for gestational age. Pain/Pressure: Present     Pelvic: Vag. Bleeding: None Vag D/C Character: Thin   Cervical exam deferred        Extremities: Normal range of motion.  Edema: None  Mental Status: Normal mood and affect. Normal behavior. Normal judgment and thought content.   Urinalysis: Urine Protein: Negative Urine Glucose: Negative  Assessment and Plan:  Pregnancy: [id number] at [redacted]w[redacted]d  1. Supervision of high-risk pregnancy, second trimester - I offered her a follow up u/s for fetal heart anatomy but at this time she declines.  2. Weight gain  - TSH  Preterm labor symptoms and general obstetric precautions including but not limited to vaginal bleeding, contractions, leaking of fluid and fetal movement were reviewed in detail with the patient. Please refer to After Visit Summary for other counseling recommendations.  No Follow-up on file.   Allie Bossier, MD

## 2015-07-16 LAB — TSH: TSH: 1.444 u[IU]/mL (ref 0.350–4.500)

## 2015-07-19 ENCOUNTER — Telehealth: Payer: Self-pay | Admitting: *Deleted

## 2015-07-19 NOTE — Telephone Encounter (Signed)
Informed pt of normal TSH result.

## 2015-08-12 ENCOUNTER — Ambulatory Visit (INDEPENDENT_AMBULATORY_CARE_PROVIDER_SITE_OTHER): Payer: 59 | Admitting: Obstetrics and Gynecology

## 2015-08-12 ENCOUNTER — Encounter: Payer: Self-pay | Admitting: Obstetrics and Gynecology

## 2015-08-12 VITALS — BP 121/83 | HR 103 | Wt 187.0 lb

## 2015-08-12 DIAGNOSIS — O2342 Unspecified infection of urinary tract in pregnancy, second trimester: Secondary | ICD-10-CM

## 2015-08-12 DIAGNOSIS — O09292 Supervision of pregnancy with other poor reproductive or obstetric history, second trimester: Secondary | ICD-10-CM

## 2015-08-12 DIAGNOSIS — O0992 Supervision of high risk pregnancy, unspecified, second trimester: Secondary | ICD-10-CM

## 2015-08-12 DIAGNOSIS — B951 Streptococcus, group B, as the cause of diseases classified elsewhere: Secondary | ICD-10-CM

## 2015-08-12 DIAGNOSIS — O360120 Maternal care for anti-D [Rh] antibodies, second trimester, not applicable or unspecified: Secondary | ICD-10-CM

## 2015-08-12 NOTE — Progress Notes (Signed)
Subjective:  Angel Pratt is a 24 y.o. [id number] at 2636w5d being seen today for ongoing prenatal care.  Patient reports no complaints.  Contractions: Not present.  Vag. Bleeding: None. Movement: Present. Denies leaking of fluid.   The following portions of the patient's history were reviewed and updated as appropriate: allergies, current medications, past family history, past medical history, past social history, past surgical history and problem list. Problem list updated.  Objective:   Filed Vitals:   08/12/15 1423  BP: 121/83  Pulse: 103  Weight: 187 lb (84.823 kg)    Fetal Status: Fetal Heart Rate (bpm): 157   Movement: Present     General:  Alert, oriented and cooperative. Patient is in no acute distress.  Skin: Skin is warm and dry. No rash noted.   Cardiovascular: Normal heart rate noted  Respiratory: Normal respiratory effort, no problems with respiration noted  Abdomen: Soft, gravid, appropriate for gestational age. Pain/Pressure: Present     Pelvic: Vag. Bleeding: None Vag D/C Character: Thin   Cervical exam deferred        Extremities: Normal range of motion.  Edema: Trace  Mental Status: Normal mood and affect. Normal behavior. Normal judgment and thought content.   Urinalysis: Urine Protein: Negative Urine Glucose: Negative  Assessment and Plan:  Pregnancy: [id number] at 2736w5d  1. Rh negative state in antepartum period, second trimester, not applicable or unspecified fetus Rhogam at next visit  2. Supervision of high-risk pregnancy, second trimester Follow up anatomy ordered 1 hour glucola next visit - US OB Follow Up; Future  3. Hx of preeclampsia, prior pregnancy, currently pregnant, second trimester Continue baby ASA  4. GBS (group B streptococcus) UTI complicating pregnancy, second trimester Will treat in labor  Preterm labor symptoms and general obstetric precautions including but not limited to vaginal bleeding, contractions, leaking of fluid and fetal  movement were reviewed in detail with the patient. Please refer to After Visit Summary for other counseling recommendations.  Return in about 3 weeks (around 09/02/2015).   Catalina AntiguaPeggy Takera Rayl, MD

## 2015-08-20 ENCOUNTER — Encounter (HOSPITAL_COMMUNITY): Payer: Self-pay

## 2015-08-20 ENCOUNTER — Ambulatory Visit (HOSPITAL_COMMUNITY)
Admission: RE | Admit: 2015-08-20 | Discharge: 2015-08-20 | Disposition: A | Discharge status: Home / Self Care | Payer: 59 | Source: Ambulatory Visit | Attending: Obstetrics and Gynecology | Admitting: Obstetrics and Gynecology

## 2015-08-20 ENCOUNTER — Other Ambulatory Visit: Payer: Self-pay | Admitting: Obstetrics and Gynecology

## 2015-08-20 VITALS — BP 127/89 | HR 109 | Wt 189.2 lb

## 2015-08-20 DIAGNOSIS — O0992 Supervision of high risk pregnancy, unspecified, second trimester: Secondary | ICD-10-CM

## 2015-08-20 DIAGNOSIS — Z36 Encounter for antenatal screening of mother: Secondary | ICD-10-CM | POA: Diagnosis not present

## 2015-08-20 DIAGNOSIS — Z3A26 26 weeks gestation of pregnancy: Secondary | ICD-10-CM | POA: Diagnosis not present

## 2015-08-20 DIAGNOSIS — O35EXX0 Maternal care for other (suspected) fetal abnormality and damage, fetal genitourinary anomalies, not applicable or unspecified: Secondary | ICD-10-CM

## 2015-08-20 DIAGNOSIS — O09299 Supervision of pregnancy with other poor reproductive or obstetric history, unspecified trimester: Secondary | ICD-10-CM | POA: Diagnosis not present

## 2015-08-20 DIAGNOSIS — O358XX0 Maternal care for other (suspected) fetal abnormality and damage, not applicable or unspecified: Secondary | ICD-10-CM

## 2015-08-20 DIAGNOSIS — IMO0002 Reserved for concepts with insufficient information to code with codable children: Secondary | ICD-10-CM

## 2015-08-20 NOTE — ED Notes (Signed)
Pt reports no fetal movement since last night.

## 2015-08-21 DIAGNOSIS — IMO0002 Reserved for concepts with insufficient information to code with codable children: Secondary | ICD-10-CM | POA: Insufficient documentation

## 2015-08-29 ENCOUNTER — Inpatient Hospital Stay (HOSPITAL_COMMUNITY): Payer: 59

## 2015-08-29 ENCOUNTER — Encounter (HOSPITAL_COMMUNITY): Payer: Self-pay | Admitting: *Deleted

## 2015-08-29 ENCOUNTER — Inpatient Hospital Stay (HOSPITAL_COMMUNITY)
Admission: AD | Admit: 2015-08-29 | Discharge: 2015-08-30 | Disposition: A | Discharge status: Home / Self Care | Payer: 59 | Source: Ambulatory Visit | Attending: Obstetrics & Gynecology | Admitting: Obstetrics & Gynecology

## 2015-08-29 DIAGNOSIS — Z3A28 28 weeks gestation of pregnancy: Secondary | ICD-10-CM | POA: Insufficient documentation

## 2015-08-29 DIAGNOSIS — Z7982 Long term (current) use of aspirin: Secondary | ICD-10-CM | POA: Insufficient documentation

## 2015-08-29 DIAGNOSIS — Z6791 Unspecified blood type, Rh negative: Secondary | ICD-10-CM | POA: Diagnosis not present

## 2015-08-29 DIAGNOSIS — O4693 Antepartum hemorrhage, unspecified, third trimester: Secondary | ICD-10-CM | POA: Diagnosis not present

## 2015-08-29 DIAGNOSIS — K219 Gastro-esophageal reflux disease without esophagitis: Secondary | ICD-10-CM | POA: Insufficient documentation

## 2015-08-29 DIAGNOSIS — O99613 Diseases of the digestive system complicating pregnancy, third trimester: Secondary | ICD-10-CM | POA: Insufficient documentation

## 2015-08-29 DIAGNOSIS — O26893 Other specified pregnancy related conditions, third trimester: Secondary | ICD-10-CM | POA: Insufficient documentation

## 2015-08-29 LAB — WET PREP, GENITAL
CLUE CELLS WET PREP: NONE SEEN
SPERM: NONE SEEN
TRICH WET PREP: NONE SEEN
YEAST WET PREP: NONE SEEN

## 2015-08-29 LAB — CBC
HCT: 32 % — ABNORMAL LOW (ref 36.0–46.0)
HEMOGLOBIN: 10.6 g/dL — AB (ref 12.0–15.0)
MCH: 26.8 pg (ref 26.0–34.0)
MCHC: 33.1 g/dL (ref 30.0–36.0)
MCV: 80.8 fL (ref 78.0–100.0)
Platelets: 266 10*3/uL (ref 150–400)
RBC: 3.96 MIL/uL (ref 3.87–5.11)
RDW: 14.5 % (ref 11.5–15.5)
WBC: 13.2 10*3/uL — AB (ref 4.0–10.5)

## 2015-08-29 LAB — URINALYSIS, ROUTINE W REFLEX MICROSCOPIC
Bilirubin Urine: NEGATIVE
GLUCOSE, UA: NEGATIVE mg/dL
Ketones, ur: NEGATIVE mg/dL
Nitrite: NEGATIVE
PH: 7 (ref 5.0–8.0)
Protein, ur: NEGATIVE mg/dL
SPECIFIC GRAVITY, URINE: 1.01 (ref 1.005–1.030)

## 2015-08-29 LAB — URINE MICROSCOPIC-ADD ON

## 2015-08-29 NOTE — MAU Provider Note (Signed)
None     Chief Complaint:  Vaginal Bleeding   Angel Pratt is  24 y.o. [id number] at [redacted]w[redacted]d presents complaining of Vaginal Bleeding . She says that around 9:30 tonight, she noticed blood whenever she was urinating. Blood was in the toilet. She then wiped and noticed small clots of blood on the toilet paper. She says that this continued over about an hour, so she came to the hospital. She has some epigastric pain with a history of GERD during her pregnancy that his untreated. Otherwise, she has not had any abdominal pain or discomfort. No contractions, no LOF. She has +FM. She denies dysuria, hematuria, nausea, or vomiting, vision changes, headache, or RUQ pain. She has a hx of Preeclampsia in prior pregnancy, and is Rh negative. She says she has had elevated blood pressures earlier in her pregnancy, and discussed them with her OB provider, though records of this were not found during chart review. She has not been on any medication for elevated blood pressures during her pregnancy.    Obstetrical/Gynecological History: OB History    Gravida Para Term Preterm AB TAB SAB Ectopic Multiple Living   Past Medical History: Past Medical History  Diagnosis Date  . Preeclampsia   . Pregnancy induced hypertension   . Pyelonephritis complicating pregnancy 03/2013  . Anemia   . Ovarian cyst   . Kidney stones     Past Surgical History: Past Surgical History  Procedure Laterality Date  . Tonsillectomy      2009  . Dilation and curettage of uterus      Family History: Family History  Problem Relation Age of Onset  . Cancer Mother     cervial dysplasia  . Ovarian cysts Mother   . Cancer Maternal Aunt     cervical  . Cancer Maternal Grandmother 40    Cervical and Breast one breast  . Ovarian cysts Maternal Grandmother   . Heart disease Maternal Grandmother   . Diabetes Maternal Grandfather   . Heart disease Maternal Grandfather     Social History: Social  History  Substance Use Topics  . Smoking status: Never Smoker   . Smokeless tobacco: Never Used  . Alcohol Use: No    Allergies:  Allergies  Allergen Reactions  . Codeine Hives    Pt says she can take vicodin  . Latex Hives    Meds:  Prescriptions prior to admission  Medication Sig Dispense Refill Last Dose  . aspirin 81 MG chewable tablet Chew 1 tablet (81 mg total) by mouth daily. 90 tablet 2 Taking  . loratadine (CLARITIN) 10 MG tablet Take 10 mg by mouth daily.   Taking  . Prenatal Multivit-Min-Fe-FA (PRENATAL VITAMINS) 0.8 MG tablet Take 1 tablet by mouth daily. 30 tablet 12 Taking    Review of Systems -   Review of Systems  Constitutional: Negative for fever, chills, weight loss, malaise/fatigue and diaphoresis.  HENT: Negative for hearing loss, ear pain, nosebleeds, congestion, sore throat, neck pain, tinnitus and ear discharge.   Eyes: Negative for blurred vision, double vision, photophobia, pain, discharge and redness.  Respiratory: Negative for cough, hemoptysis, sputum production, shortness of breath, wheezing and stridor.   Cardiovascular: Negative for chest pain, palpitations, orthopnea,  leg swelling  Gastrointestinal: Negative for abdominal pain heartburn, nausea, vomiting, diarrhea, constipation, blood in stool Genitourinary: Negative for dysuria, urgency, frequency, hematuria and flank pain.  Musculoskeletal: Negative for myalgias, back  pain, joint pain and falls.  Skin: Negative for itching and rash.  Neurological: Negative for dizziness, tingling, tremors, sensory change, speech change, focal weakness, seizures, loss of consciousness, weakness and headaches.  Endo/Heme/Allergies: Negative for environmental allergies and polydipsia. Does not bruise/bleed easily.  Psychiatric/Behavioral: Negative for depression, suicidal ideas, hallucinations, memory loss and substance abuse. The patient is not nervous/anxious and does not have insomnia.      Physical Exam   Blood pressure 156/97, pulse 103, temperature 97.9 F (36.6 C), temperature source Oral, resp. rate 18, height 5\' 1"  (1.549 m), weight 87.816 kg (193 lb 9.6 oz), last menstrual period 02/13/2015, SpO2 100 %. GENERAL: Well-developed, well-nourished female in no acute distress.  LUNGS: Clear to auscultation bilaterally.  HEART: Regular rate and rhythm. ABDOMEN: Soft, nontender, nondistended, gravid.  EXTREMITIES: Nontender, no edema, 2+ distal pulses. DTR's 2+ PELVIC:  Normal appearing dc with scant amount of dark red blood coming from cervical os.  Cx nonfriable.  CERVICAL EXAM: Dilatation 0.5cm   Effacement 20%   Station ballotable.    Presentation: cephalic FHT:  Baseline rate 146 bpm   Variability moderate  Accelerations present   Decelerations none Contractions: none.    Labs: Results for orders placed or performed during the hospital encounter of 08/29/15 (from the past 24 hour(s))  Urinalysis, Routine w reflex microscopic ([facility])   Collection Time: 08/29/15 11:04 PM  Result Value Ref Range   Color, Urine YELLOW YELLOW   APPearance CLEAR CLEAR   Specific Gravity, Urine 1.010 1.005 - 1.030   pH 7.0 5.0 - 8.0   Glucose, UA NEGATIVE NEGATIVE mg/dL   Hgb urine dipstick LARGE (A) NEGATIVE   Bilirubin Urine NEGATIVE NEGATIVE   Ketones, ur NEGATIVE NEGATIVE mg/dL   Protein, ur NEGATIVE NEGATIVE mg/dL   Nitrite NEGATIVE NEGATIVE   Leukocytes, UA SMALL (A) NEGATIVE  Urine microscopic-add on   Collection Time: 08/29/15 11:04 PM  Result Value Ref Range   Squamous Epithelial / LPF 0-5 (A) NONE SEEN   WBC, UA 0-5 0 - 5 WBC/hpf   RBC / HPF 0-5 0 - 5 RBC/hpf   Bacteria, UA FEW (A) NONE SEEN   Imaging Studies:  [facility] Ob Follow Up  08/20/2015  OBSTETRICAL ULTRASOUND: This exam was performed within a [facility]. The OB US report was generated in the AS system, and faxed to the ordering physician.  This report is available in the [facility] PACS. See the AS  Obstetric US report via the Image Link.   Assessment Angel Pratt is  24 y.o. [id number] at 4639w1d presents with vaginal bleeding with probable small marginal abruption in the setting of Rh negative status, and elevated blood pressures. U/A noramal.   Plan: - CBC, CMP, Pr/Cr all normal. BP's have come down to within normal limits.  - U/S with 3cm cervical length, and no definite evidence of abruption.  - Wet prep negative.  - Rh IG workup negative. Will give Rhogam here.  - No further active bleeding.  - Safe for discharge to home with close monitoring, precautions reviewed.  - Close follow up with prenatal provider in 1 week.   Caleb Melancon 11/24/201611:38 PM

## 2015-08-29 NOTE — MAU Note (Signed)
Pt c/o vag bleeding x1 hour with some small clots. Had intercourse 2 days ago. Denies abdominal pain. Having some rib pain but nothing new to her. Has not felt movement since 3pm.

## 2015-08-30 DIAGNOSIS — O4693 Antepartum hemorrhage, unspecified, third trimester: Secondary | ICD-10-CM

## 2015-08-30 LAB — COMPREHENSIVE METABOLIC PANEL
ALT: 11 U/L — ABNORMAL LOW (ref 14–54)
ANION GAP: 8 (ref 5–15)
AST: 14 U/L — ABNORMAL LOW (ref 15–41)
Albumin: 3.1 g/dL — ABNORMAL LOW (ref 3.5–5.0)
Alkaline Phosphatase: 67 U/L (ref 38–126)
BUN: 10 mg/dL (ref 6–20)
CO2: 23 mmol/L (ref 22–32)
Calcium: 9.3 mg/dL (ref 8.9–10.3)
Chloride: 107 mmol/L (ref 101–111)
Creatinine, Ser: 0.65 mg/dL (ref 0.44–1.00)
GFR calc Af Amer: >60 mL/min (ref >60)
GFR calc non Af Amer: >60 mL/min (ref >60)
GLUCOSE: 125 mg/dL — AB (ref 65–99)
POTASSIUM: 3.7 mmol/L (ref 3.5–5.1)
SODIUM: 138 mmol/L (ref 135–145)
Total Bilirubin: 0.2 mg/dL — ABNORMAL LOW (ref 0.3–1.2)
Total Protein: 7 g/dL (ref 6.5–8.1)

## 2015-08-30 LAB — PROTEIN / CREATININE RATIO, URINE
CREATININE, URINE: 36 mg/dL
PROTEIN CREATININE RATIO: 0.17 mg/mg{creat} — AB (ref 0.00–0.15)
TOTAL PROTEIN, URINE: 6 mg/dL

## 2015-08-30 MED ORDER — RHO D IMMUNE GLOBULIN 1500 UNIT/2ML IJ SOSY
300.0000 ug | PREFILLED_SYRINGE | Freq: Once | INTRAMUSCULAR | Status: AC
  Administered 2015-08-30: 300 ug via INTRAMUSCULAR
  Filled 2015-08-30: qty 2

## 2015-08-30 NOTE — Discharge Instructions (Signed)
Placental Abruption Your placenta is the organ that nourishes your unborn baby (fetus). Your baby gets his or her blood supply and nutrients through your placenta. It is your baby's life support system. It is attached to the inside of your uterus until after your baby is born.  Placental abruption is when the placenta partly or completely separates from the uterus before your baby is born. This is rare, but it can happen any time after 20 weeks of pregnancy. A small separation may not cause problems, but a large separation may be dangerous for you and your baby. CAUSES  Most of the time the cause of a placental abruption is unknown. Though it is rare, a placental abruption can be caused by:   An abdominal injury.   The baby turning from a buttocks-first position (breech presentation) or a sideways position (transverse) to a headfirst position (cephalic).   Delivering the first of multiple babies (twins, triplets, or more).   Sudden loss of amniotic fluid (premature rupture of the membranes).   An abnormally short umbilical cord. RISK FACTORS Some risk factors make a placental abruption more likely, including:  History of placental abruption.  High blood pressure (hypertension).  Smoking.  Alcohol intake.  Blood clotting problems.  Too much amniotic fluid.  Having had multiples (twins or triplets or more).  Seizures and convulsions.  Diabetes mellitus.  Having had more than four children.  Age 42 years or older.  Illegal drug use.  Injury to your abdomen. SIGNS AND SYMPTOMS  A small placental abruption may not cause symptoms. If you do have symptoms, they may include:  Mild abdominal pain.  Slight vaginal bleeding. Symptoms of severe placental abruption depend on the size of the separation and the stage of pregnancy. Symptoms may include:   Sudden pain in your uterus.  Abdominal pain.  Vaginal bleeding.  Tender uterus.  Severe abdominal pain with  tenderness.  Continual contractions of your uterus.  Back pain.  Weakness, light-headedness. DIAGNOSIS  Placental abruption is suspected when a pregnant woman develops sudden pain in her uterus. The health care provider will check whether the uterus is very tender, hard, and enlarging and whether the baby has an abnormal heart rate or rhythm. Ultrasonography (commonly called an ultrasound) will be done. Blood work will also be done to make sure that there are enough healthy red blood cells and that there are no clotting problems or signs of too much blood loss. TREATMENT  Placental abruption is usually an emergency. It requires treatment right away. Your treatment will depend on:   The amount of bleeding.  Whether you or you baby are in distress.  The stage of your pregnancy.  The maturity of the baby. Treatment for partial separation of the placenta is bed rest and close observation. You also may need a blood transfusion or to receive fluids through an IV tube. Treatment for complete placental separation is delivery of your baby. You may have a cesarean delivery if your baby is in distress. HOME CARE INSTRUCTIONS   Only take medicines as directed by your health care provider.  Arrange for help at home before and after you deliver the baby, especially if you had a cesarean delivery or lost a lot of blood.  Get plenty of rest and sleep.  Do not have sexual intercourse until your health care provider says it is okay.  Do not use tampons or douche unless your health care provider says it is okay. SEEK MEDICAL CARE IF:  You  have light vaginal bleeding or spotting.  You have any type of trauma, such as a fall or jolt during an accident.  You are having trouble avoiding drugs, alcohol, or smoking. SEEK IMMEDIATE MEDICAL CARE IF:  You have vaginal bleeding.  You have abdominal pain.  You have continuous uterine contractions.  You have a hard, tender uterus.  You do not feel  the baby move, or the baby moves very little. MAKE SURE YOU:  Understand these instructions.  Will watch your condition.  Will get help right away if you are not doing well or get worse.   This information is not intended to replace advice given to you by your health care provider. Make sure you discuss any questions you have with your health care provider.   Document Released: 09/21/2005 Document Revised: 09/26/2013 Document Reviewed: 07/14/2013 Elsevier Interactive Patient Education 2016 [employer].   Preterm Labor Information Preterm labor is when labor starts at less than 37 weeks of pregnancy. The normal length of a pregnancy is 39 to 41 weeks. CAUSES Often, there is no identifiable underlying cause as to why a woman goes into preterm labor. One of the most common known causes of preterm labor is infection. Infections of the uterus, cervix, vagina, amniotic sac, bladder, kidney, or even the lungs (pneumonia) can cause labor to start. Other suspected causes of preterm labor include:   Urogenital infections, such as yeast infections and bacterial vaginosis.   Uterine abnormalities (uterine shape, uterine septum, fibroids, or bleeding from the placenta).   A cervix that has been operated on (it may fail to stay closed).   Malformations in the fetus.   Multiple gestations (twins, triplets, and so on).   Breakage of the amniotic sac.  RISK FACTORS  Having a previous history of preterm labor.   Having premature rupture of membranes (PROM).   Having a placenta that covers the opening of the cervix (placenta previa).   Having a placenta that separates from the uterus (placental abruption).   Having a cervix that is too weak to hold the fetus in the uterus (incompetent cervix).   Having too much fluid in the amniotic sac (polyhydramnios).   Taking illegal drugs or smoking while pregnant.   Not gaining enough weight while pregnant.   Being younger than 3  and older than 26 years old.   Having a low socioeconomic status.   Being African American. SYMPTOMS Signs and symptoms of preterm labor include:   Menstrual-like cramps, abdominal pain, or back pain.  Uterine contractions that are regular, as frequent as six in an hour, regardless of their intensity (may be mild or painful).  Contractions that start on the top of the uterus and spread down to the lower abdomen and back.   A sense of increased pelvic pressure.   A watery or bloody mucus discharge that comes from the vagina.  TREATMENT Depending on the length of the pregnancy and other circumstances, your health care provider may suggest bed rest. If necessary, there are medicines that can be given to stop contractions and to mature the fetal lungs. If labor happens before 34 weeks of pregnancy, a prolonged hospital stay may be recommended. Treatment depends on the condition of both you and the fetus.  WHAT SHOULD YOU DO IF YOU THINK YOU ARE IN PRETERM LABOR? Call your health care provider right away. You will need to go to the hospital to get checked immediately. HOW CAN YOU PREVENT PRETERM LABOR IN FUTURE PREGNANCIES? You should:  Stop smoking if you smoke.  Maintain healthy weight gain and avoid chemicals and drugs that are not necessary.  Be watchful for any type of infection.  Inform your health care provider if you have a known history of preterm labor.   This information is not intended to replace advice given to you by your health care provider. Make sure you discuss any questions you have with your health care provider.   Document Released: 12/12/2003 Document Revised: 05/24/2013 Document Reviewed: 10/24/2012 Elsevier Interactive Patient Education [employer]2016 Elsevier Inc.

## 2015-08-31 LAB — RH IG WORKUP (INCLUDES ABO/RH)
ABO/RH(D): O NEG
ANTIBODY SCREEN: NEGATIVE
FETAL SCREEN: NEGATIVE
Gestational Age(Wks): 28
UNIT DIVISION: 0

## 2015-09-02 ENCOUNTER — Ambulatory Visit (INDEPENDENT_AMBULATORY_CARE_PROVIDER_SITE_OTHER): Payer: 59 | Admitting: Obstetrics & Gynecology

## 2015-09-02 ENCOUNTER — Other Ambulatory Visit (INDEPENDENT_AMBULATORY_CARE_PROVIDER_SITE_OTHER): Payer: 59 | Admitting: *Deleted

## 2015-09-02 VITALS — BP 118/87 | HR 101 | Wt 193.0 lb

## 2015-09-02 DIAGNOSIS — O09293 Supervision of pregnancy with other poor reproductive or obstetric history, third trimester: Secondary | ICD-10-CM

## 2015-09-02 DIAGNOSIS — Z36 Encounter for antenatal screening of mother: Secondary | ICD-10-CM | POA: Diagnosis not present

## 2015-09-02 DIAGNOSIS — O0993 Supervision of high risk pregnancy, unspecified, third trimester: Secondary | ICD-10-CM

## 2015-09-02 DIAGNOSIS — Z23 Encounter for immunization: Secondary | ICD-10-CM | POA: Diagnosis not present

## 2015-09-02 LAB — CBC
HEMATOCRIT: 32.4 % — AB (ref 36.0–46.0)
HEMOGLOBIN: 10.9 g/dL — AB (ref 12.0–15.0)
MCH: 26.3 pg (ref 26.0–34.0)
MCHC: 33.6 g/dL (ref 30.0–36.0)
MCV: 78.1 fL (ref 78.0–100.0)
MPV: 10 fL (ref 8.6–12.4)
PLATELETS: 269 10*3/uL (ref 150–400)
RBC: 4.15 MIL/uL (ref 3.87–5.11)
RDW: 14.7 % (ref 11.5–15.5)
WBC: 11.1 10*3/uL — ABNORMAL HIGH (ref 4.0–10.5)

## 2015-09-02 NOTE — Progress Notes (Signed)
Subjective:  Angel Pratt is a 24 y.o. [id number] at 5659w5d being seen today for ongoing prenatal care.  She is currently monitored for the following issues for this high-risk pregnancy and has Rh negative state in antepartum period; Cholelithiasis; Supervision of high-risk pregnancy; Hx of preeclampsia, prior pregnancy, currently pregnant; Pyelonephritis; Hydronephrosis, right; Nephrolithiasis; GBS (group B streptococcus) UTI complicating pregnancy; and Fetal pyelectasis on her problem list.  Patient reports no complaints. Was seen in MAU for bleeding on 08/29/15, had negative evaluation and received Rhogam.  Of note had BP of 156/97, no other elevated BP this pregnancy. Had history of preeclampsia in previous pregnancy, on ASA for prophylaxis.  No further bleeding since then.  Came in earlier today for her her third trimester labs and Tdap.  Contractions: Not present. Vag. Bleeding: None.  Movement: Present. Denies leaking of fluid.   The following portions of the patient's history were reviewed and updated as appropriate: allergies, current medications, past family history, past medical history, past social history, past surgical history and problem list. Problem list updated.  Objective:   Filed Vitals:   09/02/15 1445  BP: 118/87  Pulse: 101  Weight: 193 lb (87.544 kg)    Fetal Status: Fetal Heart Rate (bpm): 143 Fundal Height: 28 cm Movement: Present     General:  Alert, oriented and cooperative. Patient is in no acute distress.  Skin: Skin is warm and dry. No rash noted.   Cardiovascular: Normal heart rate noted  Respiratory: Normal respiratory effort, no problems with respiration noted  Abdomen: Soft, gravid, appropriate for gestational age. Pain/Pressure: Present     Pelvic: Vag. Bleeding: None Vag D/C Character: Thin   Cervical exam deferred        Extremities: Normal range of motion.  Edema: Trace  Mental Status: Normal mood and affect. Normal behavior. Normal judgment and  thought content.   Urinalysis: Urine Protein: Negative Urine Glucose: Negative  Assessment and Plan:  Pregnancy: [id number] at 7059w5d  Supervision of high-risk pregnancy, third trimester Hx of preeclampsia, prior pregnancy, currently pregnant, third trimester Will follow up labs.  Already received Rhogam.  Continue ASA for preeclampsia prevention.  Normal BP today. Preterm labor symptoms and general obstetric precautions including but not limited to vaginal bleeding, contractions, leaking of fluid and fetal movement were reviewed in detail with the patient. Please refer to After Visit Summary for other counseling recommendations.  Return in about 2 weeks (around 09/16/2015) for OB Visit.   Tereso NewcomerUgonna A Azul Coffie, MD

## 2015-09-02 NOTE — Addendum Note (Signed)
Addended by: Tandy GawHINTON, Dorthy Magnussen C on: 09/02/2015 10:15 AM   Modules accepted: Orders

## 2015-09-02 NOTE — Progress Notes (Signed)
Patient here today for 1 hr GTT and 28 weeks labs and TDAP.

## 2015-09-02 NOTE — Patient Instructions (Signed)
Return to clinic for any obstetric concerns or go to MAU for evaluation  

## 2015-09-03 LAB — GLUCOSE TOLERANCE, 1 HOUR (50G) W/O FASTING: Glucose, 1 Hour GTT: 115 mg/dL (ref 70–140)

## 2015-09-03 LAB — HIV ANTIBODY (ROUTINE TESTING W REFLEX): HIV 1&2 Ab, 4th Generation: NONREACTIVE

## 2015-09-03 LAB — RPR

## 2015-09-16 ENCOUNTER — Ambulatory Visit (INDEPENDENT_AMBULATORY_CARE_PROVIDER_SITE_OTHER): Payer: 59 | Admitting: Family Medicine

## 2015-09-16 ENCOUNTER — Encounter: Payer: Self-pay | Admitting: Family Medicine

## 2015-09-16 VITALS — BP 121/86 | HR 105 | Wt 195.0 lb

## 2015-09-16 DIAGNOSIS — O360130 Maternal care for anti-D [Rh] antibodies, third trimester, not applicable or unspecified: Secondary | ICD-10-CM

## 2015-09-16 DIAGNOSIS — O09293 Supervision of pregnancy with other poor reproductive or obstetric history, third trimester: Secondary | ICD-10-CM

## 2015-09-16 DIAGNOSIS — O0993 Supervision of high risk pregnancy, unspecified, third trimester: Secondary | ICD-10-CM

## 2015-09-16 NOTE — Patient Instructions (Addendum)
You can take Mucinex, (Allegra, Claritin, Zyrtec-any one of these--they are the same class--same as Benadryl), sudafed, saline nasal spray  Third Trimester of Pregnancy The third trimester is from week 29 through week 42, months 7 through 9. The third trimester is a time when the fetus is growing rapidly. At the end of the ninth month, the fetus is about 20 inches in length and weighs 6-10 pounds.  BODY CHANGES Your body goes through many changes during pregnancy. The changes vary from woman to woman.   Your weight will continue to increase. You can expect to gain 25-35 pounds (11-16 kg) by the end of the pregnancy.  You may begin to get stretch marks on your hips, abdomen, and breasts.  You may urinate more often because the fetus is moving lower into your pelvis and pressing on your bladder.  You may develop or continue to have heartburn as a result of your pregnancy.  You may develop constipation because certain hormones are causing the muscles that push waste through your intestines to slow down.  You may develop hemorrhoids or swollen, bulging veins (varicose veins).  You may have pelvic pain because of the weight gain and pregnancy hormones relaxing your joints between the bones in your pelvis. Backaches may result from overexertion of the muscles supporting your posture.  You may have changes in your hair. These can include thickening of your hair, rapid growth, and changes in texture. Some women also have hair loss during or after pregnancy, or hair that feels dry or thin. Your hair will most likely return to normal after your baby is born.  Your breasts will continue to grow and be tender. A yellow discharge may leak from your breasts called colostrum.  Your belly button may stick out.  You may feel short of breath because of your expanding uterus.  You may notice the fetus "dropping," or moving lower in your abdomen.  You may have a bloody mucus discharge. This usually occurs  a few days to a week before labor begins.  Your cervix becomes thin and soft (effaced) near your due date. WHAT TO EXPECT AT YOUR PRENATAL EXAMS  You will have prenatal exams every 2 weeks until week 36. Then, you will have weekly prenatal exams. During a routine prenatal visit:  You will be weighed to make sure you and the fetus are growing normally.  Your blood pressure is taken.  Your abdomen will be measured to track your baby's growth.  The fetal heartbeat will be listened to.  Any test results from the previous visit will be discussed.  You may have a cervical check near your due date to see if you have effaced. At around 36 weeks, your caregiver will check your cervix. At the same time, your caregiver will also perform a test on the secretions of the vaginal tissue. This test is to determine if a type of bacteria, Group B streptococcus, is present. Your caregiver will explain this further. Your caregiver may ask you:  What your birth plan is.  How you are feeling.  If you are feeling the baby move.  If you have had any abnormal symptoms, such as leaking fluid, bleeding, severe headaches, or abdominal cramping.  If you are using any tobacco products, including cigarettes, chewing tobacco, and electronic cigarettes.  If you have any questions. Other tests or screenings that may be performed during your third trimester include:  Blood tests that check for low iron levels (anemia).  Fetal testing to  check the health, activity level, and growth of the fetus. Testing is done if you have certain medical conditions or if there are problems during the pregnancy.  HIV (human immunodeficiency virus) testing. If you are at high risk, you may be screened for HIV during your third trimester of pregnancy. FALSE LABOR You may feel small, irregular contractions that eventually go away. These are called Braxton Hicks contractions, or false labor. Contractions may last for hours, days, or  even weeks before true labor sets in. If contractions come at regular intervals, intensify, or become painful, it is best to be seen by your caregiver.  SIGNS OF LABOR   Menstrual-like cramps.  Contractions that are 5 minutes apart or less.  Contractions that start on the top of the uterus and spread down to the lower abdomen and back.  A sense of increased pelvic pressure or back pain.  A watery or bloody mucus discharge that comes from the vagina. If you have any of these signs before the 37th week of pregnancy, call your caregiver right away. You need to go to the hospital to get checked immediately. HOME CARE INSTRUCTIONS   Avoid all smoking, herbs, alcohol, and unprescribed drugs. These chemicals affect the formation and growth of the baby.  Do not use any tobacco products, including cigarettes, chewing tobacco, and electronic cigarettes. If you need help quitting, ask your health care provider. You may receive counseling support and other resources to help you quit.  Follow your caregiver's instructions regarding medicine use. There are medicines that are either safe or unsafe to take during pregnancy.  Exercise only as directed by your caregiver. Experiencing uterine cramps is a good sign to stop exercising.  Continue to eat regular, healthy meals.  Wear a good support bra for breast tenderness.  Do not use hot tubs, steam rooms, or saunas.  Wear your seat belt at all times when driving.  Avoid raw meat, uncooked cheese, cat litter boxes, and soil used by cats. These carry germs that can cause birth defects in the baby.  Take your prenatal vitamins.  Take 1500-2000 mg of calcium daily starting at the 20th week of pregnancy until you deliver your baby.  Try taking a stool softener (if your caregiver approves) if you develop constipation. Eat more high-fiber foods, such as fresh vegetables or fruit and whole grains. Drink plenty of fluids to keep your urine clear or pale  yellow.  Take warm sitz baths to soothe any pain or discomfort caused by hemorrhoids. Use hemorrhoid cream if your caregiver approves.  If you develop varicose veins, wear support hose. Elevate your feet for 15 minutes, 3-4 times a day. Limit salt in your diet.  Avoid heavy lifting, wear low heal shoes, and practice good posture.  Rest a lot with your legs elevated if you have leg cramps or low back pain.  Visit your dentist if you have not gone during your pregnancy. Use a soft toothbrush to brush your teeth and be gentle when you floss.  A sexual relationship may be continued unless your caregiver directs you otherwise.  Do not travel far distances unless it is absolutely necessary and only with the approval of your caregiver.  Take prenatal classes to understand, practice, and ask questions about the labor and delivery.  Make a trial run to the hospital.  Pack your hospital bag.  Prepare the baby's nursery.  Continue to go to all your prenatal visits as directed by your caregiver. SEEK MEDICAL CARE IF:  You are unsure if you are in labor or if your water has broken.  You have dizziness.  You have mild pelvic cramps, pelvic pressure, or nagging pain in your abdominal area.  You have persistent nausea, vomiting, or diarrhea.  You have a bad smelling vaginal discharge.  You have pain with urination. SEEK IMMEDIATE MEDICAL CARE IF:   You have a fever.  You are leaking fluid from your vagina.  You have spotting or bleeding from your vagina.  You have severe abdominal cramping or pain.  You have rapid weight loss or gain.  You have shortness of breath with chest pain.  You notice sudden or extreme swelling of your face, hands, ankles, feet, or legs.  You have not felt your baby move in over an hour.  You have severe headaches that do not go away with medicine.  You have vision changes.   This information is not intended to replace advice given to you by your  health care provider. Make sure you discuss any questions you have with your health care provider.   Document Released: 09/15/2001 Document Revised: 10/12/2014 Document Reviewed: 11/22/2012 Elsevier Interactive Patient Education [employer]  Breastfeeding Deciding to breastfeed is one of the best choices you can make for you and your baby. A change in hormones during pregnancy causes your breast tissue to grow and increases the number and size of your milk ducts. These hormones also allow proteins, sugars, and fats from your blood supply to make breast milk in your milk-producing glands. Hormones prevent breast milk from being released before your baby is born as well as prompt milk flow after birth. Once breastfeeding has begun, thoughts of your baby, as well as his or her sucking or crying, can stimulate the release of milk from your milk-producing glands.  BENEFITS OF BREASTFEEDING For Your Baby  Your first milk (colostrum) helps your baby's digestive system function better.  There are antibodies in your milk that help your baby fight off infections.  Your baby has a lower incidence of asthma, allergies, and sudden infant death syndrome.  The nutrients in breast milk are better for your baby than infant formulas and are designed uniquely for your baby's needs.  Breast milk improves your baby's brain development.  Your baby is less likely to develop other conditions, such as childhood obesity, asthma, or type 2 diabetes mellitus. For You  Breastfeeding helps to create a very special bond between you and your baby.  Breastfeeding is convenient. Breast milk is always available at the correct temperature and costs nothing.  Breastfeeding helps to burn calories and helps you lose the weight gained during pregnancy.  Breastfeeding makes your uterus contract to its prepregnancy size faster and slows bleeding (lochia) after you give birth.   Breastfeeding helps to lower your risk  of developing type 2 diabetes mellitus, osteoporosis, and breast or ovarian cancer later in life. SIGNS THAT YOUR BABY IS HUNGRY Early Signs of Hunger  Increased alertness or activity.  Stretching.  Movement of the head from side to side.  Movement of the head and opening of the mouth when the corner of the mouth or cheek is stroked (rooting).  Increased sucking sounds, smacking lips, cooing, sighing, or squeaking.  Hand-to-mouth movements.  Increased sucking of fingers or hands. Late Signs of Hunger  Fussing.  Intermittent crying. Extreme Signs of Hunger Signs of extreme hunger will require calming and consoling before your baby will be able to breastfeed successfully. Do not wait for  the following signs of extreme hunger to occur before you initiate breastfeeding:  Restlessness.  A loud, strong cry.  Screaming. BREASTFEEDING BASICS Breastfeeding Initiation  Find a comfortable place to sit or lie down, with your neck and back well supported.  Place a pillow or rolled up blanket under your baby to bring him or her to the level of your breast (if you are seated). Nursing pillows are specially designed to help support your arms and your baby while you breastfeed.  Make sure that your baby's abdomen is facing your abdomen.  Gently massage your breast. With your fingertips, massage from your chest wall toward your nipple in a circular motion. This encourages milk flow. You may need to continue this action during the feeding if your milk flows slowly.  Support your breast with 4 fingers underneath and your thumb above your nipple. Make sure your fingers are well away from your nipple and your baby's mouth.  Stroke your baby's lips gently with your finger or nipple.  When your baby's mouth is open wide enough, quickly bring your baby to your breast, placing your entire nipple and as much of the colored area around your nipple (areola) as possible into your baby's mouth.  More  areola should be visible above your baby's upper lip than below the lower lip.  Your baby's tongue should be between his or her lower gum and your breast.  Ensure that your baby's mouth is correctly positioned around your nipple (latched). Your baby's lips should create a seal on your breast and be turned out (everted).  It is common for your baby to suck about 2-3 minutes in order to start the flow of breast milk. Latching Teaching your baby how to latch on to your breast properly is very important. An improper latch can cause nipple pain and decreased milk supply for you and poor weight gain in your baby. Also, if your baby is not latched onto your nipple properly, he or she may swallow some air during feeding. This can make your baby fussy. Burping your baby when you switch breasts during the feeding can help to get rid of the air. However, teaching your baby to latch on properly is still the best way to prevent fussiness from swallowing air while breastfeeding. Signs that your baby has successfully latched on to your nipple:  Silent tugging or silent sucking, without causing you pain.  Swallowing heard between every 3-4 sucks.  Muscle movement above and in front of his or her ears while sucking. Signs that your baby has not successfully latched on to nipple:  Sucking sounds or smacking sounds from your baby while breastfeeding.  Nipple pain. If you think your baby has not latched on correctly, slip your finger into the corner of your baby's mouth to break the suction and place it between your baby's gums. Attempt breastfeeding initiation again. Signs of Successful Breastfeeding Signs from your baby:  A gradual decrease in the number of sucks or complete cessation of sucking.  Falling asleep.  Relaxation of his or her body.  Retention of a small amount of milk in his or her mouth.  Letting go of your breast by himself or herself. Signs from you:  Breasts that have increased in  firmness, weight, and size 1-3 hours after feeding.  Breasts that are softer immediately after breastfeeding.  Increased milk volume, as well as a change in milk consistency and color by the fifth day of breastfeeding.  Nipples that are not sore,  cracked, or bleeding. Signs That Your Pecola Leisure is Getting Enough Milk  Wetting at least 3 diapers in a 24-hour period. The urine should be clear and pale yellow by age 39 days.  At least 3 stools in a 24-hour period by age 39 days. The stool should be soft and yellow.  At least 3 stools in a 24-hour period by age 25 days. The stool should be seedy and yellow.  No loss of weight greater than 10% of birth weight during the first 68 days of age.  Average weight gain of 4-7 ounces (113-198 g) per week after age 38 days.  Consistent daily weight gain by age 39 days, without weight loss after the age of 2 weeks. After a feeding, your baby may spit up a small amount. This is common. BREASTFEEDING FREQUENCY AND DURATION Frequent feeding will help you make more milk and can prevent sore nipples and breast engorgement. Breastfeed when you feel the need to reduce the fullness of your breasts or when your baby shows signs of hunger. This is called "breastfeeding on demand." Avoid introducing a pacifier to your baby while you are working to establish breastfeeding (the first 4-6 weeks after your baby is born). After this time you may choose to use a pacifier. Research has shown that pacifier use during the first year of a baby's life decreases the risk of sudden infant death syndrome (SIDS). Allow your baby to feed on each breast as long as he or she wants. Breastfeed until your baby is finished feeding. When your baby unlatches or falls asleep while feeding from the first breast, offer the second breast. Because newborns are often sleepy in the first few weeks of life, you may need to awaken your baby to get him or her to feed. Breastfeeding times will vary from baby to  baby. However, the following rules can serve as a guide to help you ensure that your baby is properly fed:  Newborns (babies 70 weeks of age or younger) may breastfeed every 1-3 hours.  Newborns should not go longer than 3 hours during the day or 5 hours during the night without breastfeeding.  You should breastfeed your baby a minimum of 8 times in a 24-hour period until you begin to introduce solid foods to your baby at around 29 months of age. BREAST MILK PUMPING Pumping and storing breast milk allows you to ensure that your baby is exclusively fed your breast milk, even at times when you are unable to breastfeed. This is especially important if you are going back to work while you are still breastfeeding or when you are not able to be present during feedings. Your lactation consultant can give you guidelines on how long it is safe to store breast milk. A breast pump is a machine that allows you to pump milk from your breast into a sterile bottle. The pumped breast milk can then be stored in a refrigerator or freezer. Some breast pumps are operated by hand, while others use electricity. Ask your lactation consultant which type will work best for you. Breast pumps can be purchased, but some hospitals and breastfeeding support groups lease breast pumps on a monthly basis. A lactation consultant can teach you how to hand express breast milk, if you prefer not to use a pump. CARING FOR YOUR BREASTS WHILE YOU BREASTFEED Nipples can become dry, cracked, and sore while breastfeeding. The following recommendations can help keep your breasts moisturized and healthy:  Avoid using soap on your nipples.  Wear a supportive bra. Although not required, special nursing bras and tank tops are designed to allow access to your breasts for breastfeeding without taking off your entire bra or top. Avoid wearing underwire-style bras or extremely tight bras.  Air dry your nipples for 3-33minutes after each feeding.  Use  only cotton bra pads to absorb leaked breast milk. Leaking of breast milk between feedings is normal.  Use lanolin on your nipples after breastfeeding. Lanolin helps to maintain your skin's normal moisture barrier. If you use pure lanolin, you do not need to wash it off before feeding your baby again. Pure lanolin is not toxic to your baby. You may also hand express a few drops of breast milk and gently massage that milk into your nipples and allow the milk to air dry. In the first few weeks after giving birth, some women experience extremely full breasts (engorgement). Engorgement can make your breasts feel heavy, warm, and tender to the touch. Engorgement peaks within 3-5 days after you give birth. The following recommendations can help ease engorgement:  Completely empty your breasts while breastfeeding or pumping. You may want to start by applying warm, moist heat (in the shower or with warm water-soaked hand towels) just before feeding or pumping. This increases circulation and helps the milk flow. If your baby does not completely empty your breasts while breastfeeding, pump any extra milk after he or she is finished.  Wear a snug bra (nursing or regular) or tank top for 1-2 days to signal your body to slightly decrease milk production.  Apply ice packs to your breasts, unless this is too uncomfortable for you.  Make sure that your baby is latched on and positioned properly while breastfeeding. If engorgement persists after 48 hours of following these recommendations, contact your health care provider or a Advertising copywriter. OVERALL HEALTH CARE RECOMMENDATIONS WHILE BREASTFEEDING  Eat healthy foods. Alternate between meals and snacks, eating 3 of each per day. Because what you eat affects your breast milk, some of the foods may make your baby more irritable than usual. Avoid eating these foods if you are sure that they are negatively affecting your baby.  Drink milk, fruit juice, and water  to satisfy your thirst (about 10 glasses a day).  Rest often, relax, and continue to take your prenatal vitamins to prevent fatigue, stress, and anemia.  Continue breast self-awareness checks.  Avoid chewing and smoking tobacco. Chemicals from cigarettes that pass into breast milk and exposure to secondhand smoke may harm your baby.  Avoid alcohol and drug use, including marijuana. Some medicines that may be harmful to your baby can pass through breast milk. It is important to ask your health care provider before taking any medicine, including all over-the-counter and prescription medicine as well as vitamin and herbal supplements. It is possible to become pregnant while breastfeeding. If birth control is desired, ask your health care provider about options that will be safe for your baby. SEEK MEDICAL CARE IF:  You feel like you want to stop breastfeeding or have become frustrated with breastfeeding.  You have painful breasts or nipples.  Your nipples are cracked or bleeding.  Your breasts are red, tender, or warm.  You have a swollen area on either breast.  You have a fever or chills.  You have nausea or vomiting.  You have drainage other than breast milk from your nipples.  Your breasts do not become full before feedings by the fifth day after you give birth.  You  feel sad and depressed.  Your baby is too sleepy to eat well.  Your baby is having trouble sleeping.   Your baby is wetting less than 3 diapers in a 24-hour period.  Your baby has less than 3 stools in a 24-hour period.  Your baby's skin or the white part of his or her eyes becomes yellow.   Your baby is not gaining weight by 96 days of age. SEEK IMMEDIATE MEDICAL CARE IF:  Your baby is overly tired (lethargic) and does not want to wake up and feed.  Your baby develops an unexplained fever.   This information is not intended to replace advice given to you by your health care provider. Make sure you  discuss any questions you have with your health care provider.   Document Released: 09/21/2005 Document Revised: 06/12/2015 Document Reviewed: 03/15/2013 Elsevier Interactive Patient Education [employer]

## 2015-09-16 NOTE — Progress Notes (Signed)
Pt c/o cough and congestion, had a fever over the weekend.  Gave pt the OTC medication list to use during pregnancy.

## 2015-09-16 NOTE — Progress Notes (Signed)
Subjective:  Angel Pratt is a 24 y.o. [id number] at 6663w5d being seen today for ongoing prenatal care.  She is currently monitored for the following issues for this low-risk pregnancy and has Rh negative state in antepartum period; Cholelithiasis; Supervision of high-risk pregnancy; Hx of preeclampsia, prior pregnancy, currently pregnant; GBS (group B streptococcus) UTI complicating pregnancy; and Fetal pyelectasis on her problem list.  Patient reports URI symptoms.  Contractions: Not present. Vag. Bleeding: None.  Movement: Present. Denies leaking of fluid.   The following portions of the patient's history were reviewed and updated as appropriate: allergies, current medications, past family history, past medical history, past social history, past surgical history and problem list. Problem list updated.  Objective:   Filed Vitals:   09/16/15 1540  BP: 121/86  Pulse: 105  Weight: 195 lb (88.451 kg)    Fetal Status: Fetal Heart Rate (bpm): 145 Fundal Height: 31 cm Movement: Present     General:  Alert, oriented and cooperative. Patient is in no acute distress.  Skin: Skin is warm and dry. No rash noted.   Cardiovascular: Normal heart rate noted  Respiratory: Normal respiratory effort, no problems with respiration noted  Abdomen: Soft, gravid, appropriate for gestational age. Pain/Pressure: Present     Pelvic: Vag. Bleeding: None Vag D/C Character: Thin   Cervical exam deferred        Extremities: Normal range of motion.  Edema: Trace  Mental Status: Normal mood and affect. Normal behavior. Normal judgment and thought content.   Urinalysis: Urine Protein: Negative Urine Glucose: Negative  Assessment and Plan:  Pregnancy: [id number] at 3163w5d  1. Supervision of high-risk pregnancy, third trimester Continue routine prenatal care.  2. Rh negative state in antepartum period, third trimester, not applicable or unspecified fetus S/p Rhogam  3. Hx of preeclampsia, prior pregnancy, currently  pregnant, third trimester On baby ASA   OTC med list for URI's given to patient.  Usual course and expectations reviewed. Preterm labor symptoms and general obstetric precautions including but not limited to vaginal bleeding, contractions, leaking of fluid and fetal movement were reviewed in detail with the patient. Please refer to After Visit Summary for other counseling recommendations.  Return in 2 weeks (on 09/30/2015).   Reva Boresanya S Brittanyann Wittner, MD

## 2015-10-01 ENCOUNTER — Encounter: Payer: Self-pay | Admitting: Obstetrics & Gynecology

## 2015-10-01 ENCOUNTER — Ambulatory Visit (INDEPENDENT_AMBULATORY_CARE_PROVIDER_SITE_OTHER): Payer: 59 | Admitting: Obstetrics & Gynecology

## 2015-10-01 VITALS — BP 131/85 | HR 106 | Wt 196.0 lb

## 2015-10-01 DIAGNOSIS — O0993 Supervision of high risk pregnancy, unspecified, third trimester: Secondary | ICD-10-CM

## 2015-10-01 NOTE — Progress Notes (Signed)
Subjective:  Angel Pratt is a 24 y.o. MW [id number] (2 and 26 yo kids)  at 284w6d being seen today for ongoing prenatal care.  She is currently monitored for the following issues for this high-risk pregnancy and has Rh negative state in antepartum period; Cholelithiasis; Supervision of high-risk pregnancy; Hx of preeclampsia, prior pregnancy, currently pregnant; GBS (group B streptococcus) UTI complicating pregnancy; and Fetal pyelectasis on her problem list.  Patient reports no complaints.  Contractions: Not present. Vag. Bleeding: None.  Movement: Present. Denies leaking of fluid.   The following portions of the patient's history were reviewed and updated as appropriate: allergies, current medications, past family history, past medical history, past social history, past surgical history and problem list. Problem list updated.  Objective:   Filed Vitals:   10/01/15 1025  BP: 131/85  Pulse: 106  Weight: 196 lb (88.905 kg)    Fetal Status: Fetal Heart Rate (bpm): 160   Movement: Present     General:  Alert, oriented and cooperative. Patient is in no acute distress.  Skin: Skin is warm and dry. No rash noted.   Cardiovascular: Normal heart rate noted  Respiratory: Normal respiratory effort, no problems with respiration noted  Abdomen: Soft, gravid, appropriate for gestational age. Pain/Pressure: Present     Pelvic: Vag. Bleeding: None Vag D/C Character: Thin   Cervical exam deferred        Extremities: Normal range of motion.  Edema: None  Mental Status: Normal mood and affect. Normal behavior. Normal judgment and thought content.   Urinalysis: Urine Protein: Negative Urine Glucose: Negative  Assessment and Plan:  Pregnancy: [id number] at 624w6d  1. Supervision of high-risk pregnancy, third trimester - follow up MFM u/s this week  Preterm labor symptoms and general obstetric precautions including but not limited to vaginal bleeding, contractions, leaking of fluid and fetal movement were  reviewed in detail with the patient. Please refer to After Visit Summary for other counseling recommendations.  No Follow-up on file.   Allie BossierMyra C Jessicalynn Deshong, MD

## 2015-10-04 ENCOUNTER — Encounter (HOSPITAL_COMMUNITY): Payer: Self-pay

## 2015-10-04 ENCOUNTER — Ambulatory Visit (HOSPITAL_COMMUNITY)
Admission: RE | Admit: 2015-10-04 | Discharge: 2015-10-04 | Disposition: A | Discharge status: Home / Self Care | Payer: 59 | Source: Ambulatory Visit | Attending: Obstetrics and Gynecology | Admitting: Obstetrics and Gynecology

## 2015-10-04 ENCOUNTER — Other Ambulatory Visit (HOSPITAL_COMMUNITY): Payer: Self-pay | Admitting: Maternal and Fetal Medicine

## 2015-10-04 DIAGNOSIS — O35EXX0 Maternal care for other (suspected) fetal abnormality and damage, fetal genitourinary anomalies, not applicable or unspecified: Secondary | ICD-10-CM

## 2015-10-04 DIAGNOSIS — O358XX0 Maternal care for other (suspected) fetal abnormality and damage, not applicable or unspecified: Secondary | ICD-10-CM

## 2015-10-04 DIAGNOSIS — O283 Abnormal ultrasonic finding on antenatal screening of mother: Secondary | ICD-10-CM | POA: Diagnosis not present

## 2015-10-04 DIAGNOSIS — O09899 Supervision of other high risk pregnancies, unspecified trimester: Secondary | ICD-10-CM

## 2015-10-04 DIAGNOSIS — Z3A33 33 weeks gestation of pregnancy: Secondary | ICD-10-CM

## 2015-10-04 DIAGNOSIS — O09293 Supervision of pregnancy with other poor reproductive or obstetric history, third trimester: Secondary | ICD-10-CM | POA: Insufficient documentation

## 2015-10-06 NOTE — L&D Delivery Note (Signed)
Patient is 25 y.o. [id number] [redacted]w[redacted]d admitted for IOL for gHTN, hx of Rh negative state, Hx of pre-eclampsia, GBS UTI complicating pregnancy, Fetal Pyelectasis- resolved. Pt received cytotec only, then SROM, then was complete and pushing.    Delivery Note At 2:43 AM a viable female was delivered via Vaginal, Spontaneous Delivery (Presentation: Right Occiput Anterior).  APGAR: 8, 9; weight pending .   Placenta status: Intact, Spontaneous.  Cord: 3 vessels with the following complications: None.    Anesthesia: None  Episiotomy: None Lacerations: None Suture Repair: n/a Est. Blood Loss (mL): 50  Mom to postpartum.  Baby to Couplet care / Skin to Skin.  Latitia Housewright 11/13/2015, 3:40 AM      Upon arrival patient was complete and pushing. She pushed with good maternal effort to deliver a healthy baby boy. Tight nuchal cord x2, baby delivered through nuchal without difficulty, was noted to have good tone and place on maternal abdomen for oral suctioning, drying and stimulation. Pitocin was started.  Delayed cord clamping performed. Placenta delivered intact with 3V cord. Vaginal canal and perineum was inspected and no lacerations; hemostatic. Uterus was massaged until bleeding slowed. Counts of sharps, instruments, and lap pads were all correct.   Wynne Dust, MD, PGY-1

## 2015-10-15 ENCOUNTER — Ambulatory Visit (INDEPENDENT_AMBULATORY_CARE_PROVIDER_SITE_OTHER): Payer: 59 | Admitting: Family Medicine

## 2015-10-15 VITALS — BP 120/82 | HR 120 | Wt 203.0 lb

## 2015-10-15 DIAGNOSIS — O09293 Supervision of pregnancy with other poor reproductive or obstetric history, third trimester: Secondary | ICD-10-CM

## 2015-10-15 DIAGNOSIS — Q620 Congenital hydronephrosis: Secondary | ICD-10-CM

## 2015-10-15 DIAGNOSIS — O0993 Supervision of high risk pregnancy, unspecified, third trimester: Secondary | ICD-10-CM

## 2015-10-15 DIAGNOSIS — IMO0002 Reserved for concepts with insufficient information to code with codable children: Secondary | ICD-10-CM

## 2015-10-15 NOTE — Progress Notes (Signed)
Subjective:  Angel Pratt is a 25 y.o. [id number] at 6172w6d being seen today for ongoing prenatal care.  She is currently monitored for the following issues for this low-risk pregnancy and has Rh negative state in antepartum period; Cholelithiasis; Supervision of high-risk pregnancy; Hx of preeclampsia, prior pregnancy, currently pregnant; GBS (group B streptococcus) UTI complicating pregnancy; and Fetal pyelectasis on her problem list.  Patient reports no complaints.  Contractions: Not present. Vag. Bleeding: None.  Movement: Present. Denies leaking of fluid.   The following portions of the patient's history were reviewed and updated as appropriate: allergies, current medications, past family history, past medical history, past social history, past surgical history and problem list. Problem list updated.  Objective:   Filed Vitals:   10/15/15 0843  BP: 120/82  Pulse: 120  Weight: 203 lb (92.08 kg)    Fetal Status: Fetal Heart Rate (bpm): 145 Fundal Height: 34 cm Movement: Present     General:  Alert, oriented and cooperative. Patient is in no acute distress.  Skin: Skin is warm and dry. No rash noted.   Cardiovascular: Normal heart rate noted  Respiratory: Normal respiratory effort, no problems with respiration noted  Abdomen: Soft, gravid, appropriate for gestational age. Pain/Pressure: Present     Pelvic: Vag. Bleeding: None Vag D/C Character: Thin   Cervical exam deferred        Extremities: Normal range of motion.  Edema: None  Mental Status: Normal mood and affect. Normal behavior. Normal judgment and thought content.   Urinalysis: Urine Protein: Negative Urine Glucose: Negative  Assessment and Plan:  Pregnancy: [id number] at 5372w6d  1. Supervision of high-risk pregnancy, third trimester Continue routine prenatal care. Has some friction irritation of labia--trial of A & D or Aquaphor or Vaseline  2. Hx of preeclampsia, prior pregnancy, currently pregnant, third  trimester Continue ASA Watch BP  3. Fetal pyelectasis Resolved on last u/s  Preterm labor symptoms and general obstetric precautions including but not limited to vaginal bleeding, contractions, leaking of fluid and fetal movement were reviewed in detail with the patient. Please refer to After Visit Summary for other counseling recommendations.  Return in 2 weeks (on 10/29/2015).   Reva Boresanya S Ilia Engelbert, MD

## 2015-10-15 NOTE — Patient Instructions (Signed)
Third Trimester of Pregnancy The third trimester is from week 29 through week 42, months 7 through 9. The third trimester is a time when the fetus is growing rapidly. At the end of the ninth month, the fetus is about 20 inches in length and weighs 6-10 pounds.  BODY CHANGES Your body goes through many changes during pregnancy. The changes vary from woman to woman.   Your weight will continue to increase. You can expect to gain 25-35 pounds (11-16 kg) by the end of the pregnancy.  You may begin to get stretch marks on your hips, abdomen, and breasts.  You may urinate more often because the fetus is moving lower into your pelvis and pressing on your bladder.  You may develop or continue to have heartburn as a result of your pregnancy.  You may develop constipation because certain hormones are causing the muscles that push waste through your intestines to slow down.  You may develop hemorrhoids or swollen, bulging veins (varicose veins).  You may have pelvic pain because of the weight gain and pregnancy hormones relaxing your joints between the bones in your pelvis. Backaches may result from overexertion of the muscles supporting your posture.  You may have changes in your hair. These can include thickening of your hair, rapid growth, and changes in texture. Some women also have hair loss during or after pregnancy, or hair that feels dry or thin. Your hair will most likely return to normal after your baby is born.  Your breasts will continue to grow and be tender. A yellow discharge may leak from your breasts called colostrum.  Your belly button may stick out.  You may feel short of breath because of your expanding uterus.  You may notice the fetus "dropping," or moving lower in your abdomen.  You may have a bloody mucus discharge. This usually occurs a few days to a week before labor begins.  Your cervix becomes thin and soft (effaced) near your due date. WHAT TO EXPECT AT YOUR  PRENATAL EXAMS  You will have prenatal exams every 2 weeks until week 36. Then, you will have weekly prenatal exams. During a routine prenatal visit:  You will be weighed to make sure you and the fetus are growing normally.  Your blood pressure is taken.  Your abdomen will be measured to track your baby's growth.  The fetal heartbeat will be listened to.  Any test results from the previous visit will be discussed.  You may have a cervical check near your due date to see if you have effaced. At around 36 weeks, your caregiver will check your cervix. At the same time, your caregiver will also perform a test on the secretions of the vaginal tissue. This test is to determine if a type of bacteria, Group B streptococcus, is present. Your caregiver will explain this further. Your caregiver may ask you:  What your birth plan is.  How you are feeling.  If you are feeling the baby move.  If you have had any abnormal symptoms, such as leaking fluid, bleeding, severe headaches, or abdominal cramping.  If you are using any tobacco products, including cigarettes, chewing tobacco, and electronic cigarettes.  If you have any questions. Other tests or screenings that may be performed during your third trimester include:  Blood tests that check for low iron levels (anemia).  Fetal testing to check the health, activity level, and growth of the fetus. Testing is done if you have certain medical conditions or if   there are problems during the pregnancy.  HIV (human immunodeficiency virus) testing. If you are at high risk, you may be screened for HIV during your third trimester of pregnancy. FALSE LABOR You may feel small, irregular contractions that eventually go away. These are called Braxton Hicks contractions, or false labor. Contractions may last for hours, days, or even weeks before true labor sets in. If contractions come at regular intervals, intensify, or become painful, it is best to be seen  by your caregiver.  SIGNS OF LABOR   Menstrual-like cramps.  Contractions that are 5 minutes apart or less.  Contractions that start on the top of the uterus and spread down to the lower abdomen and back.  A sense of increased pelvic pressure or back pain.  A watery or bloody mucus discharge that comes from the vagina. If you have any of these signs before the 37th week of pregnancy, call your caregiver right away. You need to go to the hospital to get checked immediately. HOME CARE INSTRUCTIONS   Avoid all smoking, herbs, alcohol, and unprescribed drugs. These chemicals affect the formation and growth of the baby.  Do not use any tobacco products, including cigarettes, chewing tobacco, and electronic cigarettes. If you need help quitting, ask your health care provider. You may receive counseling support and other resources to help you quit.  Follow your caregiver's instructions regarding medicine use. There are medicines that are either safe or unsafe to take during pregnancy.  Exercise only as directed by your caregiver. Experiencing uterine cramps is a good sign to stop exercising.  Continue to eat regular, healthy meals.  Wear a good support bra for breast tenderness.  Do not use hot tubs, steam rooms, or saunas.  Wear your seat belt at all times when driving.  Avoid raw meat, uncooked cheese, cat litter boxes, and soil used by cats. These carry germs that can cause birth defects in the baby.  Take your prenatal vitamins.  Take 1500-2000 mg of calcium daily starting at the 20th week of pregnancy until you deliver your baby.  Try taking a stool softener (if your caregiver approves) if you develop constipation. Eat more high-fiber foods, such as fresh vegetables or fruit and whole grains. Drink plenty of fluids to keep your urine clear or pale yellow.  Take warm sitz baths to soothe any pain or discomfort caused by hemorrhoids. Use hemorrhoid cream if your caregiver  approves.  If you develop varicose veins, wear support hose. Elevate your feet for 15 minutes, 3-4 times a day. Limit salt in your diet.  Avoid heavy lifting, wear low heal shoes, and practice good posture.  Rest a lot with your legs elevated if you have leg cramps or low back pain.  Visit your dentist if you have not gone during your pregnancy. Use a soft toothbrush to brush your teeth and be gentle when you floss.  A sexual relationship may be continued unless your caregiver directs you otherwise.  Do not travel far distances unless it is absolutely necessary and only with the approval of your caregiver.  Take prenatal classes to understand, practice, and ask questions about the labor and delivery.  Make a trial run to the hospital.  Pack your hospital bag.  Prepare the baby's nursery.  Continue to go to all your prenatal visits as directed by your caregiver. SEEK MEDICAL CARE IF:  You are unsure if you are in labor or if your water has broken.  You have dizziness.  You have   mild pelvic cramps, pelvic pressure, or nagging pain in your abdominal area.  You have persistent nausea, vomiting, or diarrhea.  You have a bad smelling vaginal discharge.  You have pain with urination. SEEK IMMEDIATE MEDICAL CARE IF:   You have a fever.  You are leaking fluid from your vagina.  You have spotting or bleeding from your vagina.  You have severe abdominal cramping or pain.  You have rapid weight loss or gain.  You have shortness of breath with chest pain.  You notice sudden or extreme swelling of your face, hands, ankles, feet, or legs.  You have not felt your baby move in over an hour.  You have severe headaches that do not go away with medicine.  You have vision changes.   This information is not intended to replace advice given to you by your health care provider. Make sure you discuss any questions you have with your health care provider.   Document Released:  09/15/2001 Document Revised: 10/12/2014 Document Reviewed: 11/22/2012 [employer] Interactive Patient Education 2016 [employer]  Breastfeeding Deciding to breastfeed is one of the best choices you can make for you and your baby. A change in hormones during pregnancy causes your breast tissue to grow and increases the number and size of your milk ducts. These hormones also allow proteins, sugars, and fats from your blood supply to make breast milk in your milk-producing glands. Hormones prevent breast milk from being released before your baby is born as well as prompt milk flow after birth. Once breastfeeding has begun, thoughts of your baby, as well as his or her sucking or crying, can stimulate the release of milk from your milk-producing glands.  BENEFITS OF BREASTFEEDING For Your Baby  Your first milk (colostrum) helps your baby's digestive system function better.  There are antibodies in your milk that help your baby fight off infections.  Your baby has a lower incidence of asthma, allergies, and sudden infant death syndrome.  The nutrients in breast milk are better for your baby than infant formulas and are designed uniquely for your baby's needs.  Breast milk improves your baby's brain development.  Your baby is less likely to develop other conditions, such as childhood obesity, asthma, or type 2 diabetes mellitus. For You  Breastfeeding helps to create a very special bond between you and your baby.  Breastfeeding is convenient. Breast milk is always available at the correct temperature and costs nothing.  Breastfeeding helps to burn calories and helps you lose the weight gained during pregnancy.  Breastfeeding makes your uterus contract to its prepregnancy size faster and slows bleeding (lochia) after you give birth.   Breastfeeding helps to lower your risk of developing type 2 diabetes mellitus, osteoporosis, and breast or ovarian cancer later in life. SIGNS THAT YOUR BABY IS  HUNGRY Early Signs of Hunger  Increased alertness or activity.  Stretching.  Movement of the head from side to side.  Movement of the head and opening of the mouth when the corner of the mouth or cheek is stroked (rooting).  Increased sucking sounds, smacking lips, cooing, sighing, or squeaking.  Hand-to-mouth movements.  Increased sucking of fingers or hands. Late Signs of Hunger  Fussing.  Intermittent crying. Extreme Signs of Hunger Signs of extreme hunger will require calming and consoling before your baby will be able to breastfeed successfully. Do not wait for the following signs of extreme hunger to occur before you initiate breastfeeding:  Restlessness.  A loud, strong cry.  Screaming.   BREASTFEEDING BASICS Breastfeeding Initiation  Find a comfortable place to sit or lie down, with your neck and back well supported.  Place a pillow or rolled up blanket under your baby to bring him or her to the level of your breast (if you are seated). Nursing pillows are specially designed to help support your arms and your baby while you breastfeed.  Make sure that your baby's abdomen is facing your abdomen.  Gently massage your breast. With your fingertips, massage from your chest wall toward your nipple in a circular motion. This encourages milk flow. You may need to continue this action during the feeding if your milk flows slowly.  Support your breast with 4 fingers underneath and your thumb above your nipple. Make sure your fingers are well away from your nipple and your baby's mouth.  Stroke your baby's lips gently with your finger or nipple.  When your baby's mouth is open wide enough, quickly bring your baby to your breast, placing your entire nipple and as much of the colored area around your nipple (areola) as possible into your baby's mouth.  More areola should be visible above your baby's upper lip than below the lower lip.  Your baby's tongue should be between his  or her lower gum and your breast.  Ensure that your baby's mouth is correctly positioned around your nipple (latched). Your baby's lips should create a seal on your breast and be turned out (everted).  It is common for your baby to suck about 2-3 minutes in order to start the flow of breast milk. Latching Teaching your baby how to latch on to your breast properly is very important. An improper latch can cause nipple pain and decreased milk supply for you and poor weight gain in your baby. Also, if your baby is not latched onto your nipple properly, he or she may swallow some air during feeding. This can make your baby fussy. Burping your baby when you switch breasts during the feeding can help to get rid of the air. However, teaching your baby to latch on properly is still the best way to prevent fussiness from swallowing air while breastfeeding. Signs that your baby has successfully latched on to your nipple:  Silent tugging or silent sucking, without causing you pain.  Swallowing heard between every 3-4 sucks.  Muscle movement above and in front of his or her ears while sucking. Signs that your baby has not successfully latched on to nipple:  Sucking sounds or smacking sounds from your baby while breastfeeding.  Nipple pain. If you think your baby has not latched on correctly, slip your finger into the corner of your baby's mouth to break the suction and place it between your baby's gums. Attempt breastfeeding initiation again. Signs of Successful Breastfeeding Signs from your baby:  A gradual decrease in the number of sucks or complete cessation of sucking.  Falling asleep.  Relaxation of his or her body.  Retention of a small amount of milk in his or her mouth.  Letting go of your breast by himself or herself. Signs from you:  Breasts that have increased in firmness, weight, and size 1-3 hours after feeding.  Breasts that are softer immediately after  breastfeeding.  Increased milk volume, as well as a change in milk consistency and color by the fifth day of breastfeeding.  Nipples that are not sore, cracked, or bleeding. Signs That Your Baby is Getting Enough Milk  Wetting at least 3 diapers in a 24-hour period.   The urine should be clear and pale yellow by age 5 days.  At least 3 stools in a 24-hour period by age 5 days. The stool should be soft and yellow.  At least 3 stools in a 24-hour period by age 7 days. The stool should be seedy and yellow.  No loss of weight greater than 10% of birth weight during the first 3 days of age.  Average weight gain of 4-7 ounces (113-198 g) per week after age 4 days.  Consistent daily weight gain by age 5 days, without weight loss after the age of 2 weeks. After a feeding, your baby may spit up a small amount. This is common. BREASTFEEDING FREQUENCY AND DURATION Frequent feeding will help you make more milk and can prevent sore nipples and breast engorgement. Breastfeed when you feel the need to reduce the fullness of your breasts or when your baby shows signs of hunger. This is called "breastfeeding on demand." Avoid introducing a pacifier to your baby while you are working to establish breastfeeding (the first 4-6 weeks after your baby is born). After this time you may choose to use a pacifier. Research has shown that pacifier use during the first year of a baby's life decreases the risk of sudden infant death syndrome (SIDS). Allow your baby to feed on each breast as long as he or she wants. Breastfeed until your baby is finished feeding. When your baby unlatches or falls asleep while feeding from the first breast, offer the second breast. Because newborns are often sleepy in the first few weeks of life, you may need to awaken your baby to get him or her to feed. Breastfeeding times will vary from baby to baby. However, the following rules can serve as a guide to help you ensure that your baby is  properly fed:  Newborns (babies 4 weeks of age or younger) may breastfeed every 1-3 hours.  Newborns should not go longer than 3 hours during the day or 5 hours during the night without breastfeeding.  You should breastfeed your baby a minimum of 8 times in a 24-hour period until you begin to introduce solid foods to your baby at around 6 months of age. BREAST MILK PUMPING Pumping and storing breast milk allows you to ensure that your baby is exclusively fed your breast milk, even at times when you are unable to breastfeed. This is especially important if you are going back to work while you are still breastfeeding or when you are not able to be present during feedings. Your lactation consultant can give you guidelines on how long it is safe to store breast milk. A breast pump is a machine that allows you to pump milk from your breast into a sterile bottle. The pumped breast milk can then be stored in a refrigerator or freezer. Some breast pumps are operated by hand, while others use electricity. Ask your lactation consultant which type will work best for you. Breast pumps can be purchased, but some hospitals and breastfeeding support groups lease breast pumps on a monthly basis. A lactation consultant can teach you how to hand express breast milk, if you prefer not to use a pump. CARING FOR YOUR BREASTS WHILE YOU BREASTFEED Nipples can become dry, cracked, and sore while breastfeeding. The following recommendations can help keep your breasts moisturized and healthy:  Avoid using soap on your nipples.  Wear a supportive bra. Although not required, special nursing bras and tank tops are designed to allow access to your   breasts for breastfeeding without taking off your entire bra or top. Avoid wearing underwire-style bras or extremely tight bras.  Air dry your nipples for 3-4minutes after each feeding.  Use only cotton bra pads to absorb leaked breast milk. Leaking of breast milk between feedings  is normal.  Use lanolin on your nipples after breastfeeding. Lanolin helps to maintain your skin's normal moisture barrier. If you use pure lanolin, you do not need to wash it off before feeding your baby again. Pure lanolin is not toxic to your baby. You may also hand express a few drops of breast milk and gently massage that milk into your nipples and allow the milk to air dry. In the first few weeks after giving birth, some women experience extremely full breasts (engorgement). Engorgement can make your breasts feel heavy, warm, and tender to the touch. Engorgement peaks within 3-5 days after you give birth. The following recommendations can help ease engorgement:  Completely empty your breasts while breastfeeding or pumping. You may want to start by applying warm, moist heat (in the shower or with warm water-soaked hand towels) just before feeding or pumping. This increases circulation and helps the milk flow. If your baby does not completely empty your breasts while breastfeeding, pump any extra milk after he or she is finished.  Wear a snug bra (nursing or regular) or tank top for 1-2 days to signal your body to slightly decrease milk production.  Apply ice packs to your breasts, unless this is too uncomfortable for you.  Make sure that your baby is latched on and positioned properly while breastfeeding. If engorgement persists after 48 hours of following these recommendations, contact your health care provider or a lactation consultant. OVERALL HEALTH CARE RECOMMENDATIONS WHILE BREASTFEEDING  Eat healthy foods. Alternate between meals and snacks, eating 3 of each per day. Because what you eat affects your breast milk, some of the foods may make your baby more irritable than usual. Avoid eating these foods if you are sure that they are negatively affecting your baby.  Drink milk, fruit juice, and water to satisfy your thirst (about 10 glasses a day).  Rest often, relax, and continue to take  your prenatal vitamins to prevent fatigue, stress, and anemia.  Continue breast self-awareness checks.  Avoid chewing and smoking tobacco. Chemicals from cigarettes that pass into breast milk and exposure to secondhand smoke may harm your baby.  Avoid alcohol and drug use, including marijuana. Some medicines that may be harmful to your baby can pass through breast milk. It is important to ask your health care provider before taking any medicine, including all over-the-counter and prescription medicine as well as vitamin and herbal supplements. It is possible to become pregnant while breastfeeding. If birth control is desired, ask your health care provider about options that will be safe for your baby. SEEK MEDICAL CARE IF:  You feel like you want to stop breastfeeding or have become frustrated with breastfeeding.  You have painful breasts or nipples.  Your nipples are cracked or bleeding.  Your breasts are red, tender, or warm.  You have a swollen area on either breast.  You have a fever or chills.  You have nausea or vomiting.  You have drainage other than breast milk from your nipples.  Your breasts do not become full before feedings by the fifth day after you give birth.  You feel sad and depressed.  Your baby is too sleepy to eat well.  Your baby is having trouble sleeping.     Your baby is wetting less than 3 diapers in a 24-hour period.  Your baby has less than 3 stools in a 24-hour period.  Your baby's skin or the white part of his or her eyes becomes yellow.   Your baby is not gaining weight by 5 days of age. SEEK IMMEDIATE MEDICAL CARE IF:  Your baby is overly tired (lethargic) and does not want to wake up and feed.  Your baby develops an unexplained fever.   This information is not intended to replace advice given to you by your health care provider. Make sure you discuss any questions you have with your health care provider.   Document Released: 09/21/2005  Document Revised: 06/12/2015 Document Reviewed: 03/15/2013 [employer] Interactive Patient Education 2016 [employer]  

## 2015-10-30 ENCOUNTER — Ambulatory Visit (INDEPENDENT_AMBULATORY_CARE_PROVIDER_SITE_OTHER): Payer: 59 | Admitting: Family Medicine

## 2015-10-30 VITALS — BP 125/86 | HR 112 | Wt 208.0 lb

## 2015-10-30 DIAGNOSIS — B951 Streptococcus, group B, as the cause of diseases classified elsewhere: Secondary | ICD-10-CM

## 2015-10-30 DIAGNOSIS — Z113 Encounter for screening for infections with a predominantly sexual mode of transmission: Secondary | ICD-10-CM

## 2015-10-30 DIAGNOSIS — O0993 Supervision of high risk pregnancy, unspecified, third trimester: Secondary | ICD-10-CM

## 2015-10-30 DIAGNOSIS — O2343 Unspecified infection of urinary tract in pregnancy, third trimester: Secondary | ICD-10-CM

## 2015-10-30 NOTE — Patient Instructions (Signed)
Breastfeeding Deciding to breastfeed is one of the best choices you can make for you and your baby. A change in hormones during pregnancy causes your breast tissue to grow and increases the number and size of your milk ducts. These hormones also allow proteins, sugars, and fats from your blood supply to make breast milk in your milk-producing glands. Hormones prevent breast milk from being released before your baby is born as well as prompt milk flow after birth. Once breastfeeding has begun, thoughts of your baby, as well as his or her sucking or crying, can stimulate the release of milk from your milk-producing glands.  BENEFITS OF BREASTFEEDING For Your Baby  Your first milk (colostrum) helps your baby's digestive system function better.  There are antibodies in your milk that help your baby fight off infections.  Your baby has a lower incidence of asthma, allergies, and sudden infant death syndrome.  The nutrients in breast milk are better for your baby than infant formulas and are designed uniquely for your baby's needs.  Breast milk improves your baby's brain development.  Your baby is less likely to develop other conditions, such as childhood obesity, asthma, or type 2 diabetes mellitus. For You  Breastfeeding helps to create a very special bond between you and your baby.  Breastfeeding is convenient. Breast milk is always available at the correct temperature and costs nothing.  Breastfeeding helps to burn calories and helps you lose the weight gained during pregnancy.  Breastfeeding makes your uterus contract to its prepregnancy size faster and slows bleeding (lochia) after you give birth.   Breastfeeding helps to lower your risk of developing type 2 diabetes mellitus, osteoporosis, and breast or ovarian cancer later in life. SIGNS THAT YOUR BABY IS HUNGRY Early Signs of Hunger  Increased alertness or activity.  Stretching.  Movement of the head from side to  side.  Movement of the head and opening of the mouth when the corner of the mouth or cheek is stroked (rooting).  Increased sucking sounds, smacking lips, cooing, sighing, or squeaking.  Hand-to-mouth movements.  Increased sucking of fingers or hands. Late Signs of Hunger  Fussing.  Intermittent crying. Extreme Signs of Hunger Signs of extreme hunger will require calming and consoling before your baby will be able to breastfeed successfully. Do not wait for the following signs of extreme hunger to occur before you initiate breastfeeding:  Restlessness.  A loud, strong cry.  Screaming. BREASTFEEDING BASICS Breastfeeding Initiation  Find a comfortable place to sit or lie down, with your neck and back well supported.  Place a pillow or rolled up blanket under your baby to bring him or her to the level of your breast (if you are seated). Nursing pillows are specially designed to help support your arms and your baby while you breastfeed.  Make sure that your baby's abdomen is facing your abdomen.  Gently massage your breast. With your fingertips, massage from your chest wall toward your nipple in a circular motion. This encourages milk flow. You may need to continue this action during the feeding if your milk flows slowly.  Support your breast with 4 fingers underneath and your thumb above your nipple. Make sure your fingers are well away from your nipple and your baby's mouth.  Stroke your baby's lips gently with your finger or nipple.  When your baby's mouth is open wide enough, quickly bring your baby to your breast, placing your entire nipple and as much of the colored area around your nipple (  areola) as possible into your baby's mouth.  More areola should be visible above your baby's upper lip than below the lower lip.  Your baby's tongue should be between his or her lower gum and your breast.  Ensure that your baby's mouth is correctly positioned around your nipple  (latched). Your baby's lips should create a seal on your breast and be turned out (everted).  It is common for your baby to suck about 2-3 minutes in order to start the flow of breast milk. Latching Teaching your baby how to latch on to your breast properly is very important. An improper latch can cause nipple pain and decreased milk supply for you and poor weight gain in your baby. Also, if your baby is not latched onto your nipple properly, he or she may swallow some air during feeding. This can make your baby fussy. Burping your baby when you switch breasts during the feeding can help to get rid of the air. However, teaching your baby to latch on properly is still the best way to prevent fussiness from swallowing air while breastfeeding. Signs that your baby has successfully latched on to your nipple:  Silent tugging or silent sucking, without causing you pain.  Swallowing heard between every 3-4 sucks.  Muscle movement above and in front of his or her ears while sucking. Signs that your baby has not successfully latched on to nipple:  Sucking sounds or smacking sounds from your baby while breastfeeding.  Nipple pain. If you think your baby has not latched on correctly, slip your finger into the corner of your baby's mouth to break the suction and place it between your baby's gums. Attempt breastfeeding initiation again. Signs of Successful Breastfeeding Signs from your baby:  A gradual decrease in the number of sucks or complete cessation of sucking.  Falling asleep.  Relaxation of his or her body.  Retention of a small amount of milk in his or her mouth.  Letting go of your breast by himself or herself. Signs from you:  Breasts that have increased in firmness, weight, and size 1-3 hours after feeding.  Breasts that are softer immediately after breastfeeding.  Increased milk volume, as well as a change in milk consistency and color by the fifth day of breastfeeding.  Nipples  that are not sore, cracked, or bleeding. Signs That Your Baby is Getting Enough Milk  Wetting at least 3 diapers in a 24-hour period. The urine should be clear and pale yellow by age 5 days.  At least 3 stools in a 24-hour period by age 5 days. The stool should be soft and yellow.  At least 3 stools in a 24-hour period by age 7 days. The stool should be seedy and yellow.  No loss of weight greater than 10% of birth weight during the first 3 days of age.  Average weight gain of 4-7 ounces (113-198 g) per week after age 4 days.  Consistent daily weight gain by age 5 days, without weight loss after the age of 2 weeks. After a feeding, your baby may spit up a small amount. This is common. BREASTFEEDING FREQUENCY AND DURATION Frequent feeding will help you make more milk and can prevent sore nipples and breast engorgement. Breastfeed when you feel the need to reduce the fullness of your breasts or when your baby shows signs of hunger. This is called "breastfeeding on demand." Avoid introducing a pacifier to your baby while you are working to establish breastfeeding (the first 4-6 weeks   after your baby is born). After this time you may choose to use a pacifier. Research has shown that pacifier use during the first year of a baby's life decreases the risk of sudden infant death syndrome (SIDS). Allow your baby to feed on each breast as long as he or she wants. Breastfeed until your baby is finished feeding. When your baby unlatches or falls asleep while feeding from the first breast, offer the second breast. Because newborns are often sleepy in the first few weeks of life, you may need to awaken your baby to get him or her to feed. Breastfeeding times will vary from baby to baby. However, the following rules can serve as a guide to help you ensure that your baby is properly fed:  Newborns (babies 4 weeks of age or younger) may breastfeed every 1-3 hours.  Newborns should not go longer than 3 hours  during the day or 5 hours during the night without breastfeeding.  You should breastfeed your baby a minimum of 8 times in a 24-hour period until you begin to introduce solid foods to your baby at around 6 months of age. BREAST MILK PUMPING Pumping and storing breast milk allows you to ensure that your baby is exclusively fed your breast milk, even at times when you are unable to breastfeed. This is especially important if you are going back to work while you are still breastfeeding or when you are not able to be present during feedings. Your lactation consultant can give you guidelines on how long it is safe to store breast milk. A breast pump is a machine that allows you to pump milk from your breast into a sterile bottle. The pumped breast milk can then be stored in a refrigerator or freezer. Some breast pumps are operated by hand, while others use electricity. Ask your lactation consultant which type will work best for you. Breast pumps can be purchased, but some hospitals and breastfeeding support groups lease breast pumps on a monthly basis. A lactation consultant can teach you how to hand express breast milk, if you prefer not to use a pump. CARING FOR YOUR BREASTS WHILE YOU BREASTFEED Nipples can become dry, cracked, and sore while breastfeeding. The following recommendations can help keep your breasts moisturized and healthy:  Avoid using soap on your nipples.  Wear a supportive bra. Although not required, special nursing bras and tank tops are designed to allow access to your breasts for breastfeeding without taking off your entire bra or top. Avoid wearing underwire-style bras or extremely tight bras.  Air dry your nipples for 3-4minutes after each feeding.  Use only cotton bra pads to absorb leaked breast milk. Leaking of breast milk between feedings is normal.  Use lanolin on your nipples after breastfeeding. Lanolin helps to maintain your skin's normal moisture barrier. If you use  pure lanolin, you do not need to wash it off before feeding your baby again. Pure lanolin is not toxic to your baby. You may also hand express a few drops of breast milk and gently massage that milk into your nipples and allow the milk to air dry. In the first few weeks after giving birth, some women experience extremely full breasts (engorgement). Engorgement can make your breasts feel heavy, warm, and tender to the touch. Engorgement peaks within 3-5 days after you give birth. The following recommendations can help ease engorgement:  Completely empty your breasts while breastfeeding or pumping. You may want to start by applying warm, moist heat (in   the shower or with warm water-soaked hand towels) just before feeding or pumping. This increases circulation and helps the milk flow. If your baby does not completely empty your breasts while breastfeeding, pump any extra milk after he or she is finished.  Wear a snug bra (nursing or regular) or tank top for 1-2 days to signal your body to slightly decrease milk production.  Apply ice packs to your breasts, unless this is too uncomfortable for you.  Make sure that your baby is latched on and positioned properly while breastfeeding. If engorgement persists after 48 hours of following these recommendations, contact your health care provider or a lactation consultant. OVERALL HEALTH CARE RECOMMENDATIONS WHILE BREASTFEEDING  Eat healthy foods. Alternate between meals and snacks, eating 3 of each per day. Because what you eat affects your breast milk, some of the foods may make your baby more irritable than usual. Avoid eating these foods if you are sure that they are negatively affecting your baby.  Drink milk, fruit juice, and water to satisfy your thirst (about 10 glasses a day).  Rest often, relax, and continue to take your prenatal vitamins to prevent fatigue, stress, and anemia.  Continue breast self-awareness checks.  Avoid chewing and smoking  tobacco. Chemicals from cigarettes that pass into breast milk and exposure to secondhand smoke may harm your baby.  Avoid alcohol and drug use, including marijuana. Some medicines that may be harmful to your baby can pass through breast milk. It is important to ask your health care provider before taking any medicine, including all over-the-counter and prescription medicine as well as vitamin and herbal supplements. It is possible to become pregnant while breastfeeding. If birth control is desired, ask your health care provider about options that will be safe for your baby. SEEK MEDICAL CARE IF:  You feel like you want to stop breastfeeding or have become frustrated with breastfeeding.  You have painful breasts or nipples.  Your nipples are cracked or bleeding.  Your breasts are red, tender, or warm.  You have a swollen area on either breast.  You have a fever or chills.  You have nausea or vomiting.  You have drainage other than breast milk from your nipples.  Your breasts do not become full before feedings by the fifth day after you give birth.  You feel sad and depressed.  Your baby is too sleepy to eat well.  Your baby is having trouble sleeping.   Your baby is wetting less than 3 diapers in a 24-hour period.  Your baby has less than 3 stools in a 24-hour period.  Your baby's skin or the white part of his or her eyes becomes yellow.   Your baby is not gaining weight by 5 days of age. SEEK IMMEDIATE MEDICAL CARE IF:  Your baby is overly tired (lethargic) and does not want to wake up and feed.  Your baby develops an unexplained fever.   This information is not intended to replace advice given to you by your health care provider. Make sure you discuss any questions you have with your health care provider.   Document Released: 09/21/2005 Document Revised: 06/12/2015 Document Reviewed: 03/15/2013 Elsevier Interactive Patient Education 2016 [employer]  

## 2015-10-30 NOTE — Progress Notes (Signed)
Subjective:  Angel Pratt is a 25 y.o. [id number] at [redacted]w[redacted]d being seen today for ongoing prenatal care.  She is currently monitored for the following issues for this low-risk pregnancy and has Rh negative state in antepartum period; Cholelithiasis; Supervision of high-risk pregnancy; Hx of preeclampsia, prior pregnancy, currently pregnant; GBS (group B streptococcus) UTI complicating pregnancy; and Fetal pyelectasis on her problem list.  Patient reports no complaints.  Contractions: Irregular. Vag. Bleeding: None.  Movement: Present. Denies leaking of fluid.   The following portions of the patient's history were reviewed and updated as appropriate: allergies, current medications, past family history, past medical history, past social history, past surgical history and problem list. Problem list updated.  Objective:   Filed Vitals:   10/30/15 0832  BP: 125/86  Pulse: 112  Weight: 208 lb (94.348 kg)    Fetal Status: Fetal Heart Rate (bpm): 160 Fundal Height: 36 cm Movement: Present  Presentation: Vertex  General:  Alert, oriented and cooperative. Patient is in no acute distress.  Skin: Skin is warm and dry. No rash noted.   Cardiovascular: Normal heart rate noted  Respiratory: Normal respiratory effort, no problems with respiration noted  Abdomen: Soft, gravid, appropriate for gestational age. Pain/Pressure: Present     Pelvic: Vag. Bleeding: None Vag D/C Character: Thin   Cervical exam performed Dilation: 1.5 Effacement (%): 40 Station: -3  Extremities: Normal range of motion.  Edema: None  Mental Status: Normal mood and affect. Normal behavior. Normal judgment and thought content.   Urinalysis: Urine Protein: Negative Urine Glucose: Negative  Assessment and Plan:  Pregnancy: [id number] at [redacted]w[redacted]d  1. Supervision of high-risk pregnancy, third trimester Continue routine prenatal care. GBS in urine already - Urine cytology ancillary only  2. GBS (group B streptococcus) UTI complicating  pregnancy, third trimester Needs treatment in labor  Term labor symptoms and general obstetric precautions including but not limited to vaginal bleeding, contractions, leaking of fluid and fetal movement were reviewed in detail with the patient. Please refer to After Visit Summary for other counseling recommendations.  Return in 1 week (on 11/06/2015).   Reva Bores, MD

## 2015-10-31 LAB — URINE CYTOLOGY ANCILLARY ONLY
CHLAMYDIA, DNA PROBE: NEGATIVE
Neisseria Gonorrhea: NEGATIVE

## 2015-11-03 ENCOUNTER — Encounter (HOSPITAL_COMMUNITY): Payer: Self-pay

## 2015-11-03 ENCOUNTER — Inpatient Hospital Stay (HOSPITAL_COMMUNITY)
Admission: AD | Admit: 2015-11-03 | Discharge: 2015-11-03 | Disposition: A | Discharge status: Home / Self Care | Payer: 59 | Source: Ambulatory Visit | Attending: Obstetrics & Gynecology | Admitting: Obstetrics & Gynecology

## 2015-11-03 DIAGNOSIS — O99820 Streptococcus B carrier state complicating pregnancy: Secondary | ICD-10-CM | POA: Insufficient documentation

## 2015-11-03 DIAGNOSIS — B951 Streptococcus, group B, as the cause of diseases classified elsewhere: Secondary | ICD-10-CM

## 2015-11-03 DIAGNOSIS — O2343 Unspecified infection of urinary tract in pregnancy, third trimester: Secondary | ICD-10-CM | POA: Diagnosis not present

## 2015-11-03 DIAGNOSIS — Z3A37 37 weeks gestation of pregnancy: Secondary | ICD-10-CM | POA: Diagnosis not present

## 2015-11-03 DIAGNOSIS — O358XX0 Maternal care for other (suspected) fetal abnormality and damage, not applicable or unspecified: Secondary | ICD-10-CM | POA: Diagnosis not present

## 2015-11-03 DIAGNOSIS — IMO0002 Reserved for concepts with insufficient information to code with codable children: Secondary | ICD-10-CM

## 2015-11-03 LAB — URINALYSIS, ROUTINE W REFLEX MICROSCOPIC
BILIRUBIN URINE: NEGATIVE
Glucose, UA: NEGATIVE mg/dL
HGB URINE DIPSTICK: NEGATIVE
KETONES UR: NEGATIVE mg/dL
NITRITE: NEGATIVE
PH: 6.5 (ref 5.0–8.0)
Protein, ur: NEGATIVE mg/dL
SPECIFIC GRAVITY, URINE: 1.02 (ref 1.005–1.030)

## 2015-11-03 LAB — URINE MICROSCOPIC-ADD ON

## 2015-11-03 MED ORDER — LACTATED RINGERS IV BOLUS (SEPSIS)
1000.0000 mL | Freq: Once | INTRAVENOUS | Status: AC
  Administered 2015-11-03: 1000 mL via INTRAVENOUS

## 2015-11-03 MED ORDER — ONDANSETRON 8 MG PO TBDP
8.0000 mg | ORAL_TABLET | Freq: Once | ORAL | Status: AC
  Administered 2015-11-03: 8 mg via ORAL
  Filled 2015-11-03: qty 1

## 2015-11-03 NOTE — MAU Provider Note (Signed)
History   [id number] @ 37.4 wks in with c/i contractions that started at 1252 per pt. Also has some nausea and vomiting since contractrins have started.  CSN: [id number]  Arrival date & time 11/03/15  1625   None     Chief Complaint  Patient presents with  . Labor Eval  . Chills  . Emesis    HPI  Past Medical History  Diagnosis Date  . Preeclampsia   . Pregnancy induced hypertension   . Pyelonephritis complicating pregnancy 03/2013  . Anemia   . Ovarian cyst   . Kidney stones     Past Surgical History  Procedure Laterality Date  . Tonsillectomy      2009  . Dilation and curettage of uterus      Family History  Problem Relation Age of Onset  . Cancer Mother     cervial dysplasia  . Ovarian cysts Mother   . Cancer Maternal Aunt     cervical  . Cancer Maternal Grandmother 40    Cervical and Breast one breast  . Ovarian cysts Maternal Grandmother   . Heart disease Maternal Grandmother   . Diabetes Maternal Grandfather   . Heart disease Maternal Grandfather     Social History  Substance Use Topics  . Smoking status: Never Smoker   . Smokeless tobacco: Never Used  . Alcohol Use: No    OB History    Gravida Para Term Preterm AB TAB SAB Ectopic Multiple Living   Review of Systems  Constitutional: Negative.   HENT: Negative.   Eyes: Negative.   Respiratory: Negative.   Cardiovascular: Negative.   Gastrointestinal: Positive for nausea, vomiting and abdominal pain.  Endocrine: Negative.   Genitourinary: Negative.   Musculoskeletal: Negative.   Skin: Negative.   Allergic/Immunologic: Negative.   Neurological: Negative.   Hematological: Negative.   Psychiatric/Behavioral: Negative.     Allergies  Codeine and Latex  Home Medications  No current outpatient prescriptions on file.  BP 138/77 mmHg  Pulse 139  Temp(Src) 98.4 F (36.9 C) (Oral)  Resp 18  SpO2 96%  LMP 02/13/2015  Physical Exam  Constitutional: She is oriented  to person, place, and time. She appears well-developed and well-nourished.  HENT:  Head: Normocephalic.  Eyes: Pupils are equal, round, and reactive to light.  Neck: Normal range of motion.  Cardiovascular: Normal rate, regular rhythm, normal heart sounds and intact distal pulses.   Pulmonary/Chest: Effort normal and breath sounds normal.  Abdominal: Soft. Bowel sounds are normal.  Genitourinary: Vagina normal and uterus normal.  Musculoskeletal: Normal range of motion.  Neurological: She is alert and oriented to person, place, and time. She has normal reflexes.  Skin: Skin is warm and dry.  Psychiatric: She has a normal mood and affect. Her behavior is normal. Judgment and thought content normal.    MAU Course  Procedures (including critical care time)  Labs Reviewed  URINALYSIS, ROUTINE W REFLEX MICROSCOPIC (NOT AT [facility])   No results found.   1. Fetal pyelectasis   2. GBS (group B streptococcus) UTI complicating pregnancy, third trimester       MDM  SVE 2-3/th/post/high. urineneg will d/c home

## 2015-11-03 NOTE — MAU Note (Addendum)
Patient presents with contractions every 9 minutes apart since 1252 denies vaginal bleeding, no leaking of fluid, positive FM, fhr 167  Vomited x 4 since 1:00 pm

## 2015-11-03 NOTE — Discharge Instructions (Signed)
Fetal Movement Counts °Patient Name: __________________________________________________ Patient Due Date: ____________________ °Performing a fetal movement count is highly recommended in high-risk pregnancies, but it is good for every pregnant woman to do. Your health care provider may ask you to start counting fetal movements at 28 weeks of the pregnancy. Fetal movements often increase: °· After eating a full meal. °· After physical activity. °· After eating or drinking something sweet or cold. °· At rest. °Pay attention to when you feel the baby is most active. This will help you notice a pattern of your baby's sleep and wake cycles and what factors contribute to an increase in fetal movement. It is important to perform a fetal movement count at the same time each day when your baby is normally most active.  °HOW TO COUNT FETAL MOVEMENTS °1. Find a quiet and comfortable area to sit or lie down on your left side. Lying on your left side provides the best blood and oxygen circulation to your baby. °2. Write down the day and time on a sheet of paper or in a journal. °3. Start counting kicks, flutters, swishes, rolls, or jabs in a 2-hour period. You should feel at least 10 movements within 2 hours. °4. If you do not feel 10 movements in 2 hours, wait 2-3 hours and count again. Look for a change in the pattern or not enough counts in 2 hours. °SEEK MEDICAL CARE IF: °· You feel less than 10 counts in 2 hours, tried twice. °· There is no movement in over an hour. °· The pattern is changing or taking longer each day to reach 10 counts in 2 hours. °· You feel the baby is not moving as he or she usually does. °Date: ____________ Movements: ____________ Start time: ____________ Finish time: ____________  °Date: ____________ Movements: ____________ Start time: ____________ Finish time: ____________ °Date: ____________ Movements: ____________ Start time: ____________ Finish time: ____________ °Date: ____________ Movements:  ____________ Start time: ____________ Finish time: ____________ °Date: ____________ Movements: ____________ Start time: ____________ Finish time: ____________ °Date: ____________ Movements: ____________ Start time: ____________ Finish time: ____________ °Date: ____________ Movements: ____________ Start time: ____________ Finish time: ____________ °Date: ____________ Movements: ____________ Start time: ____________ Finish time: ____________  °Date: ____________ Movements: ____________ Start time: ____________ Finish time: ____________ °Date: ____________ Movements: ____________ Start time: ____________ Finish time: ____________ °Date: ____________ Movements: ____________ Start time: ____________ Finish time: ____________ °Date: ____________ Movements: ____________ Start time: ____________ Finish time: ____________ °Date: ____________ Movements: ____________ Start time: ____________ Finish time: ____________ °Date: ____________ Movements: ____________ Start time: ____________ Finish time: ____________ °Date: ____________ Movements: ____________ Start time: ____________ Finish time: ____________  °Date: ____________ Movements: ____________ Start time: ____________ Finish time: ____________ °Date: ____________ Movements: ____________ Start time: ____________ Finish time: ____________ °Date: ____________ Movements: ____________ Start time: ____________ Finish time: ____________ °Date: ____________ Movements: ____________ Start time: ____________ Finish time: ____________ °Date: ____________ Movements: ____________ Start time: ____________ Finish time: ____________ °Date: ____________ Movements: ____________ Start time: ____________ Finish time: ____________ °Date: ____________ Movements: ____________ Start time: ____________ Finish time: ____________  °Date: ____________ Movements: ____________ Start time: ____________ Finish time: ____________ °Date: ____________ Movements: ____________ Start time: ____________ Finish  time: ____________ °Date: ____________ Movements: ____________ Start time: ____________ Finish time: ____________ °Date: ____________ Movements: ____________ Start time: ____________ Finish time: ____________ °Date: ____________ Movements: ____________ Start time: ____________ Finish time: ____________ °Date: ____________ Movements: ____________ Start time: ____________ Finish time: ____________ °Date: ____________ Movements: ____________ Start time: ____________ Finish time: ____________  °Date: ____________ Movements: ____________ Start time: ____________ Finish   time: ____________ °Date: ____________ Movements: ____________ Start time: ____________ Finish time: ____________ °Date: ____________ Movements: ____________ Start time: ____________ Finish time: ____________ °Date: ____________ Movements: ____________ Start time: ____________ Finish time: ____________ °Date: ____________ Movements: ____________ Start time: ____________ Finish time: ____________ °Date: ____________ Movements: ____________ Start time: ____________ Finish time: ____________ °Date: ____________ Movements: ____________ Start time: ____________ Finish time: ____________  °Date: ____________ Movements: ____________ Start time: ____________ Finish time: ____________ °Date: ____________ Movements: ____________ Start time: ____________ Finish time: ____________ °Date: ____________ Movements: ____________ Start time: ____________ Finish time: ____________ °Date: ____________ Movements: ____________ Start time: ____________ Finish time: ____________ °Date: ____________ Movements: ____________ Start time: ____________ Finish time: ____________ °Date: ____________ Movements: ____________ Start time: ____________ Finish time: ____________ °Date: ____________ Movements: ____________ Start time: ____________ Finish time: ____________  °Date: ____________ Movements: ____________ Start time: ____________ Finish time: ____________ °Date: ____________  Movements: ____________ Start time: ____________ Finish time: ____________ °Date: ____________ Movements: ____________ Start time: ____________ Finish time: ____________ °Date: ____________ Movements: ____________ Start time: ____________ Finish time: ____________ °Date: ____________ Movements: ____________ Start time: ____________ Finish time: ____________ °Date: ____________ Movements: ____________ Start time: ____________ Finish time: ____________ °Date: ____________ Movements: ____________ Start time: ____________ Finish time: ____________  °Date: ____________ Movements: ____________ Start time: ____________ Finish time: ____________ °Date: ____________ Movements: ____________ Start time: ____________ Finish time: ____________ °Date: ____________ Movements: ____________ Start time: ____________ Finish time: ____________ °Date: ____________ Movements: ____________ Start time: ____________ Finish time: ____________ °Date: ____________ Movements: ____________ Start time: ____________ Finish time: ____________ °Date: ____________ Movements: ____________ Start time: ____________ Finish time: ____________ °  °This information is not intended to replace advice given to you by your health care provider. Make sure you discuss any questions you have with your health care provider. °  °Document Released: 10/21/2006 Document Revised: 10/12/2014 Document Reviewed: 07/18/2012 °[employer]vier Interactive Patient Education ©2016 [employer] °Vaginal Delivery °During delivery, your health care provider will help you give birth to your baby. During a vaginal delivery, you will work to push the baby out of your vagina. However, before you can push your baby out, a few things need to happen. The opening of your uterus (cervix) has to soften, thin out, and open up (dilate) all the way to 10 cm. Also, your baby has to move down from the uterus into your vagina.  °SIGNS OF LABOR  °Your health care provider will first need to make sure you  are in labor. Signs of labor include:  °· Passing what is called the mucous plug before labor begins. This is a small amount of blood-stained mucus. °· Having regular, painful uterine contractions.   °· The time between contractions gets shorter.   °· The discomfort and pain gradually get more intense. °· Contraction pains get worse when walking and do not go away when resting.   °· Your cervix becomes thinner (effacement) and dilates. °BEFORE THE DELIVERY °Once you are in labor and admitted into the hospital or care center, your health care provider may do the following:  °5. Perform a complete physical exam. °6. Review any complications related to pregnancy or labor.  °7. Check your blood pressure, pulse, temperature, and heart rate (vital signs).   °8. Determine if, and when, the rupture of amniotic membranes occurred. °9. Do a vaginal exam (using a sterile glove and lubricant) to determine:   °1. The position (presentation) of the baby. Is the baby's head presenting first (vertex) in the birth canal (vagina), or are the feet or buttocks first (breech)?   °2. The level (station) of the baby's head within   the birth canal.   °3. The effacement and dilatation of the cervix.   °10. An electronic fetal monitor is usually placed on your abdomen when you first arrive. This is used to monitor your contractions and the baby's heart rate. °1. When the monitor is on your abdomen (external fetal monitor), it can only pick up the frequency and length of your contractions. It cannot tell the strength of your contractions. °2. If it becomes necessary for your health care provider to know exactly how strong your contractions are or to see exactly what the baby's heart rate is doing, an internal monitor may be inserted into your vagina and uterus. Your health care provider will discuss the benefits and risks of using an internal monitor and obtain your permission before inserting the device. °3. Continuous fetal monitoring may be  needed if you have an epidural, are receiving certain medicines (such as oxytocin), or have pregnancy or labor complications. °11. An IV access tube may be placed into a vein in your arm to deliver fluids and medicines if necessary. °THREE STAGES OF LABOR AND DELIVERY °Normal labor and delivery is divided into three stages. °First Stage °This stage starts when you begin to contract regularly and your cervix begins to efface and dilate. It ends when your cervix is completely open (fully dilated). The first stage is the longest stage of labor and can last from 3 hours to 15 hours.  °Several methods are available to help with labor pain. You and your health care provider will decide which option is best for you. Options include:  °· Opioid medicines. These are strong pain medicines that you can get through your IV tube or as a shot into your muscle. These medicines lessen pain but do not make it go away completely.  °· Epidural. A medicine is given through a thin tube that is inserted in your back. The medicine numbs the lower part of your body and prevents any pain in that area. °· Paracervical pain medicine. This is an injection of an anesthetic on each side of your cervix.   °· You may request natural childbirth, which does not involve the use of pain medicines or an epidural during labor and delivery. Instead, you will use other things, such as breathing exercises, to help cope with the pain. °Second Stage °The second stage of labor begins when your cervix is fully dilated at 10 cm. It continues until you push your baby down through the birth canal and the baby is born. This stage can take only minutes or several hours. °· The location of your baby's head as it moves through the birth canal is reported as a number called a station. If the baby's head has not started its descent, the station is described as being at minus 3 (-3). When your baby's head is at the zero station, it is at the middle of the birth canal  and is engaged in the pelvis. The station of your baby helps indicate the progress of the second stage of labor. °· When your baby is born, your health care provider may hold the baby with his or her head lowered to prevent amniotic fluid, mucus, and blood from getting into the baby's lungs. The baby's mouth and nose may be suctioned with a small bulb syringe to remove any additional fluid. °· Your health care provider may then place the baby on your stomach. It is important to keep the baby from getting cold. To do this, the health care provider will dry   the baby off, place the baby directly on your skin (with no blankets between you and the baby), and cover the baby with warm, dry blankets.   °· The umbilical cord is cut. °Third Stage °During the third stage of labor, your health care provider will deliver the placenta (afterbirth) and make sure your bleeding is under control. The delivery of the placenta usually takes about 5 minutes but can take up to 30 minutes. After the placenta is delivered, a medicine may be given either by IV or injection to help contract the uterus and control bleeding. If you are planning to breastfeed, you can try to do so now. °After you deliver the placenta, your uterus should contract and get very firm. If your uterus does not remain firm, your health care provider will massage it. This is important because the contraction of the uterus helps cut off bleeding at the site where the placenta was attached to your uterus. If your uterus does not contract properly and stay firm, you may continue to bleed heavily. If there is a lot of bleeding, medicines may be given to contract the uterus and stop the bleeding.  °  °This information is not intended to replace advice given to you by your health care provider. Make sure you discuss any questions you have with your health care provider. °  °Document Released: 06/30/2008 Document Revised: 10/12/2014 Document Reviewed: 05/18/2012 °Elsevier  Interactive Patient Education ©2016 [employer] ° °

## 2015-11-04 ENCOUNTER — Ambulatory Visit (INDEPENDENT_AMBULATORY_CARE_PROVIDER_SITE_OTHER): Payer: 59 | Admitting: Family Medicine

## 2015-11-04 VITALS — BP 132/90 | HR 112 | Wt 211.0 lb

## 2015-11-04 DIAGNOSIS — O0993 Supervision of high risk pregnancy, unspecified, third trimester: Secondary | ICD-10-CM

## 2015-11-04 NOTE — Progress Notes (Signed)
Subjective:  Angel Pratt is a 25 y.o. [id number] at [redacted]w[redacted]d being seen today for ongoing prenatal care.  She is currently monitored for the following issues for this low-risk pregnancy and has Rh negative state in antepartum period; Cholelithiasis; Supervision of high-risk pregnancy; Hx of preeclampsia, prior pregnancy, currently pregnant; GBS (group B streptococcus) UTI complicating pregnancy; and Fetal pyelectasis on her problem list.  Patient reports no complaints.  Contractions: Regular. Vag. Bleeding: None.  Movement: Present. Denies leaking of fluid.   The following portions of the patient's history were reviewed and updated as appropriate: allergies, current medications, past family history, past medical history, past social history, past surgical history and problem list. Problem list updated.  Objective:   Filed Vitals:   11/04/15 1348  BP: 132/90  Pulse: 112  Weight: 211 lb (95.709 kg)    Fetal Status: Fetal Heart Rate (bpm): 136 Fundal Height: 37 cm Movement: Present  Presentation: Vertex  General:  Alert, oriented and cooperative. Patient is in no acute distress.  Skin: Skin is warm and dry. No rash noted.   Cardiovascular: Normal heart rate noted  Respiratory: Normal respiratory effort, no problems with respiration noted  Abdomen: Soft, gravid, appropriate for gestational age. Pain/Pressure: Present     Pelvic: Vag. Bleeding: None Vag D/C Character: Thin   Cervical exam performed Dilation: 2 Effacement (%): 40 Station: -3  Extremities: Normal range of motion.  Edema: Mild pitting, slight indentation  Mental Status: Normal mood and affect. Normal behavior. Normal judgment and thought content.   Urinalysis: Urine Protein: Negative Urine Glucose: Negative  Assessment and Plan:  Pregnancy: [id number] at [redacted]w[redacted]d  1. Supervision of high-risk pregnancy, third trimester Patient here for labor check--no evidence of labor right now--or very ealy.  Term labor symptoms and general  obstetric precautions including but not limited to vaginal bleeding, contractions, leaking of fluid and fetal movement were reviewed in detail with the patient. Please refer to After Visit Summary for other counseling recommendations.  Return in 1 week (on 11/11/2015).   Reva Bores, MD

## 2015-11-04 NOTE — Patient Instructions (Signed)
Breastfeeding Deciding to breastfeed is one of the best choices you can make for you and your baby. A change in hormones during pregnancy causes your breast tissue to grow and increases the number and size of your milk ducts. These hormones also allow proteins, sugars, and fats from your blood supply to make breast milk in your milk-producing glands. Hormones prevent breast milk from being released before your baby is born as well as prompt milk flow after birth. Once breastfeeding has begun, thoughts of your baby, as well as his or her sucking or crying, can stimulate the release of milk from your milk-producing glands.  BENEFITS OF BREASTFEEDING For Your Baby  Your first milk (colostrum) helps your baby's digestive system function better.  There are antibodies in your milk that help your baby fight off infections.  Your baby has a lower incidence of asthma, allergies, and sudden infant death syndrome.  The nutrients in breast milk are better for your baby than infant formulas and are designed uniquely for your baby's needs.  Breast milk improves your baby's brain development.  Your baby is less likely to develop other conditions, such as childhood obesity, asthma, or type 2 diabetes mellitus. For You  Breastfeeding helps to create a very special bond between you and your baby.  Breastfeeding is convenient. Breast milk is always available at the correct temperature and costs nothing.  Breastfeeding helps to burn calories and helps you lose the weight gained during pregnancy.  Breastfeeding makes your uterus contract to its prepregnancy size faster and slows bleeding (lochia) after you give birth.   Breastfeeding helps to lower your risk of developing type 2 diabetes mellitus, osteoporosis, and breast or ovarian cancer later in life. SIGNS THAT YOUR BABY IS HUNGRY Early Signs of Hunger  Increased alertness or activity.  Stretching.  Movement of the head from side to  side.  Movement of the head and opening of the mouth when the corner of the mouth or cheek is stroked (rooting).  Increased sucking sounds, smacking lips, cooing, sighing, or squeaking.  Hand-to-mouth movements.  Increased sucking of fingers or hands. Late Signs of Hunger  Fussing.  Intermittent crying. Extreme Signs of Hunger Signs of extreme hunger will require calming and consoling before your baby will be able to breastfeed successfully. Do not wait for the following signs of extreme hunger to occur before you initiate breastfeeding:  Restlessness.  A loud, strong cry.  Screaming. BREASTFEEDING BASICS Breastfeeding Initiation  Find a comfortable place to sit or lie down, with your neck and back well supported.  Place a pillow or rolled up blanket under your baby to bring him or her to the level of your breast (if you are seated). Nursing pillows are specially designed to help support your arms and your baby while you breastfeed.  Make sure that your baby's abdomen is facing your abdomen.  Gently massage your breast. With your fingertips, massage from your chest wall toward your nipple in a circular motion. This encourages milk flow. You may need to continue this action during the feeding if your milk flows slowly.  Support your breast with 4 fingers underneath and your thumb above your nipple. Make sure your fingers are well away from your nipple and your baby's mouth.  Stroke your baby's lips gently with your finger or nipple.  When your baby's mouth is open wide enough, quickly bring your baby to your breast, placing your entire nipple and as much of the colored area around your nipple (  areola) as possible into your baby's mouth.  More areola should be visible above your baby's upper lip than below the lower lip.  Your baby's tongue should be between his or her lower gum and your breast.  Ensure that your baby's mouth is correctly positioned around your nipple  (latched). Your baby's lips should create a seal on your breast and be turned out (everted).  It is common for your baby to suck about 2-3 minutes in order to start the flow of breast milk. Latching Teaching your baby how to latch on to your breast properly is very important. An improper latch can cause nipple pain and decreased milk supply for you and poor weight gain in your baby. Also, if your baby is not latched onto your nipple properly, he or she may swallow some air during feeding. This can make your baby fussy. Burping your baby when you switch breasts during the feeding can help to get rid of the air. However, teaching your baby to latch on properly is still the best way to prevent fussiness from swallowing air while breastfeeding. Signs that your baby has successfully latched on to your nipple:  Silent tugging or silent sucking, without causing you pain.  Swallowing heard between every 3-4 sucks.  Muscle movement above and in front of his or her ears while sucking. Signs that your baby has not successfully latched on to nipple:  Sucking sounds or smacking sounds from your baby while breastfeeding.  Nipple pain. If you think your baby has not latched on correctly, slip your finger into the corner of your baby's mouth to break the suction and place it between your baby's gums. Attempt breastfeeding initiation again. Signs of Successful Breastfeeding Signs from your baby:  A gradual decrease in the number of sucks or complete cessation of sucking.  Falling asleep.  Relaxation of his or her body.  Retention of a small amount of milk in his or her mouth.  Letting go of your breast by himself or herself. Signs from you:  Breasts that have increased in firmness, weight, and size 1-3 hours after feeding.  Breasts that are softer immediately after breastfeeding.  Increased milk volume, as well as a change in milk consistency and color by the fifth day of breastfeeding.  Nipples  that are not sore, cracked, or bleeding. Signs That Your Baby is Getting Enough Milk  Wetting at least 3 diapers in a 24-hour period. The urine should be clear and pale yellow by age 5 days.  At least 3 stools in a 24-hour period by age 5 days. The stool should be soft and yellow.  At least 3 stools in a 24-hour period by age 7 days. The stool should be seedy and yellow.  No loss of weight greater than 10% of birth weight during the first 3 days of age.  Average weight gain of 4-7 ounces (113-198 g) per week after age 4 days.  Consistent daily weight gain by age 5 days, without weight loss after the age of 2 weeks. After a feeding, your baby may spit up a small amount. This is common. BREASTFEEDING FREQUENCY AND DURATION Frequent feeding will help you make more milk and can prevent sore nipples and breast engorgement. Breastfeed when you feel the need to reduce the fullness of your breasts or when your baby shows signs of hunger. This is called "breastfeeding on demand." Avoid introducing a pacifier to your baby while you are working to establish breastfeeding (the first 4-6 weeks   after your baby is born). After this time you may choose to use a pacifier. Research has shown that pacifier use during the first year of a baby's life decreases the risk of sudden infant death syndrome (SIDS). Allow your baby to feed on each breast as long as he or she wants. Breastfeed until your baby is finished feeding. When your baby unlatches or falls asleep while feeding from the first breast, offer the second breast. Because newborns are often sleepy in the first few weeks of life, you may need to awaken your baby to get him or her to feed. Breastfeeding times will vary from baby to baby. However, the following rules can serve as a guide to help you ensure that your baby is properly fed:  Newborns (babies 4 weeks of age or younger) may breastfeed every 1-3 hours.  Newborns should not go longer than 3 hours  during the day or 5 hours during the night without breastfeeding.  You should breastfeed your baby a minimum of 8 times in a 24-hour period until you begin to introduce solid foods to your baby at around 6 months of age. BREAST MILK PUMPING Pumping and storing breast milk allows you to ensure that your baby is exclusively fed your breast milk, even at times when you are unable to breastfeed. This is especially important if you are going back to work while you are still breastfeeding or when you are not able to be present during feedings. Your lactation consultant can give you guidelines on how long it is safe to store breast milk. A breast pump is a machine that allows you to pump milk from your breast into a sterile bottle. The pumped breast milk can then be stored in a refrigerator or freezer. Some breast pumps are operated by hand, while others use electricity. Ask your lactation consultant which type will work best for you. Breast pumps can be purchased, but some hospitals and breastfeeding support groups lease breast pumps on a monthly basis. A lactation consultant can teach you how to hand express breast milk, if you prefer not to use a pump. CARING FOR YOUR BREASTS WHILE YOU BREASTFEED Nipples can become dry, cracked, and sore while breastfeeding. The following recommendations can help keep your breasts moisturized and healthy:  Avoid using soap on your nipples.  Wear a supportive bra. Although not required, special nursing bras and tank tops are designed to allow access to your breasts for breastfeeding without taking off your entire bra or top. Avoid wearing underwire-style bras or extremely tight bras.  Air dry your nipples for 3-4minutes after each feeding.  Use only cotton bra pads to absorb leaked breast milk. Leaking of breast milk between feedings is normal.  Use lanolin on your nipples after breastfeeding. Lanolin helps to maintain your skin's normal moisture barrier. If you use  pure lanolin, you do not need to wash it off before feeding your baby again. Pure lanolin is not toxic to your baby. You may also hand express a few drops of breast milk and gently massage that milk into your nipples and allow the milk to air dry. In the first few weeks after giving birth, some women experience extremely full breasts (engorgement). Engorgement can make your breasts feel heavy, warm, and tender to the touch. Engorgement peaks within 3-5 days after you give birth. The following recommendations can help ease engorgement:  Completely empty your breasts while breastfeeding or pumping. You may want to start by applying warm, moist heat (in   the shower or with warm water-soaked hand towels) just before feeding or pumping. This increases circulation and helps the milk flow. If your baby does not completely empty your breasts while breastfeeding, pump any extra milk after he or she is finished.  Wear a snug bra (nursing or regular) or tank top for 1-2 days to signal your body to slightly decrease milk production.  Apply ice packs to your breasts, unless this is too uncomfortable for you.  Make sure that your baby is latched on and positioned properly while breastfeeding. If engorgement persists after 48 hours of following these recommendations, contact your health care provider or a lactation consultant. OVERALL HEALTH CARE RECOMMENDATIONS WHILE BREASTFEEDING  Eat healthy foods. Alternate between meals and snacks, eating 3 of each per day. Because what you eat affects your breast milk, some of the foods may make your baby more irritable than usual. Avoid eating these foods if you are sure that they are negatively affecting your baby.  Drink milk, fruit juice, and water to satisfy your thirst (about 10 glasses a day).  Rest often, relax, and continue to take your prenatal vitamins to prevent fatigue, stress, and anemia.  Continue breast self-awareness checks.  Avoid chewing and smoking  tobacco. Chemicals from cigarettes that pass into breast milk and exposure to secondhand smoke may harm your baby.  Avoid alcohol and drug use, including marijuana. Some medicines that may be harmful to your baby can pass through breast milk. It is important to ask your health care provider before taking any medicine, including all over-the-counter and prescription medicine as well as vitamin and herbal supplements. It is possible to become pregnant while breastfeeding. If birth control is desired, ask your health care provider about options that will be safe for your baby. SEEK MEDICAL CARE IF:  You feel like you want to stop breastfeeding or have become frustrated with breastfeeding.  You have painful breasts or nipples.  Your nipples are cracked or bleeding.  Your breasts are red, tender, or warm.  You have a swollen area on either breast.  You have a fever or chills.  You have nausea or vomiting.  You have drainage other than breast milk from your nipples.  Your breasts do not become full before feedings by the fifth day after you give birth.  You feel sad and depressed.  Your baby is too sleepy to eat well.  Your baby is having trouble sleeping.   Your baby is wetting less than 3 diapers in a 24-hour period.  Your baby has less than 3 stools in a 24-hour period.  Your baby's skin or the white part of his or her eyes becomes yellow.   Your baby is not gaining weight by 5 days of age. SEEK IMMEDIATE MEDICAL CARE IF:  Your baby is overly tired (lethargic) and does not want to wake up and feed.  Your baby develops an unexplained fever.   This information is not intended to replace advice given to you by your health care provider. Make sure you discuss any questions you have with your health care provider.   Document Released: 09/21/2005 Document Revised: 06/12/2015 Document Reviewed: 03/15/2013 Elsevier Interactive Patient Education 2016 [employer]  

## 2015-11-06 ENCOUNTER — Encounter: Payer: 59 | Admitting: Obstetrics and Gynecology

## 2015-11-11 ENCOUNTER — Ambulatory Visit (INDEPENDENT_AMBULATORY_CARE_PROVIDER_SITE_OTHER): Payer: 59 | Admitting: Obstetrics & Gynecology

## 2015-11-11 VITALS — BP 126/89 | HR 106 | Wt 216.0 lb

## 2015-11-11 DIAGNOSIS — O0993 Supervision of high risk pregnancy, unspecified, third trimester: Secondary | ICD-10-CM

## 2015-11-11 DIAGNOSIS — O3663X1 Maternal care for excessive fetal growth, third trimester, fetus 1: Secondary | ICD-10-CM

## 2015-11-11 DIAGNOSIS — O09293 Supervision of pregnancy with other poor reproductive or obstetric history, third trimester: Secondary | ICD-10-CM

## 2015-11-11 NOTE — Progress Notes (Signed)
Subjective:  DARCEY CARDY is a 25 y.o. [id number] at [redacted]w[redacted]d being seen today for ongoing prenatal care.  She is currently monitored for the following issues for this high-risk pregnancy and has Rh negative state in antepartum period; Cholelithiasis; Supervision of high-risk pregnancy; Hx of preeclampsia, prior pregnancy, currently pregnant; GBS (group B streptococcus) UTI complicating pregnancy; and Fetal pyelectasis on her problem list.  Patient reports no complaints.  Contractions: Irregular. Vag. Bleeding: None.  Movement: Present. Denies leaking of fluid.   The following portions of the patient's history were reviewed and updated as appropriate: allergies, current medications, past family history, past medical history, past social history, past surgical history and problem list. Problem list updated.  Objective:   Filed Vitals:   11/11/15 1435  BP: 126/89  Pulse: 106  Weight: 216 lb (97.977 kg)    Fetal Status: Fetal Heart Rate (bpm): 151 Fundal Height: 43 cm Movement: Present  Presentation: Vertex  General:  Alert, oriented and cooperative. Patient is in no acute distress.  Skin: Skin is warm and dry. No rash noted.   Cardiovascular: Normal heart rate noted  Respiratory: Normal respiratory effort, no problems with respiration noted  Abdomen: Soft, gravid, appropriate for gestational age. Pain/Pressure: Present     Pelvic: Vag. Bleeding: None Vag D/C Character: Thin   Cervical exam performed Dilation: 2 Effacement (%): 40 Station: Ballotable  Extremities: Normal range of motion.  Edema: Mild pitting, slight indentation  Mental Status: Normal mood and affect. Normal behavior. Normal judgment and thought content.   Urinalysis: Urine Protein: Negative Urine Glucose: Negative  Assessment and Plan:  Pregnancy: [id number] at [redacted]w[redacted]d  1. Hx of preeclampsia, prior pregnancy, currently pregnant, third trimester Borderline BP, not quite GHTN or preeclampsia yet. Will recheck in 2 days.  Preeclampsia precautions advised.  2. Large for dates affecting management of mother, third trimester, fetus 1 Concerned about fetal growth and AFI - [place] MFM OB FOLLOW UP; Future  3. Supervision of high-risk pregnancy, third trimester Term labor symptoms and general obstetric precautions including but not limited to vaginal bleeding, contractions, leaking of fluid and fetal movement were reviewed in detail with the patient. Please refer to After Visit Summary for other counseling recommendations.  Return in about 2 days (around 11/13/2015) for BP check and 11/18/15 for OB visit.   Tereso Newcomer, MD

## 2015-11-11 NOTE — Patient Instructions (Signed)
Return to clinic for any obstetric concerns or go to MAU for evaluation  

## 2015-11-12 ENCOUNTER — Encounter (HOSPITAL_COMMUNITY): Payer: Self-pay | Admitting: Anesthesiology

## 2015-11-12 ENCOUNTER — Ambulatory Visit (HOSPITAL_COMMUNITY)
Admission: RE | Admit: 2015-11-12 | Discharge: 2015-11-12 | Disposition: A | Discharge status: Home / Self Care | Payer: 59 | Source: Ambulatory Visit | Attending: Obstetrics & Gynecology | Admitting: Obstetrics & Gynecology

## 2015-11-12 ENCOUNTER — Inpatient Hospital Stay (HOSPITAL_COMMUNITY)
Admission: AD | Admit: 2015-11-12 | Discharge: 2015-11-14 | DRG: 775 | Disposition: A | Discharge status: Home / Self Care | Payer: 59 | Source: Ambulatory Visit | Attending: Family Medicine | Admitting: Family Medicine

## 2015-11-12 ENCOUNTER — Ambulatory Visit (INDEPENDENT_AMBULATORY_CARE_PROVIDER_SITE_OTHER): Payer: 59 | Admitting: Obstetrics & Gynecology

## 2015-11-12 ENCOUNTER — Other Ambulatory Visit: Payer: 59

## 2015-11-12 ENCOUNTER — Encounter (HOSPITAL_COMMUNITY): Payer: Self-pay | Admitting: *Deleted

## 2015-11-12 VITALS — BP 167/107 | HR 108 | Wt 216.0 lb

## 2015-11-12 DIAGNOSIS — B951 Streptococcus, group B, as the cause of diseases classified elsewhere: Secondary | ICD-10-CM

## 2015-11-12 DIAGNOSIS — O133 Gestational [pregnancy-induced] hypertension without significant proteinuria, third trimester: Secondary | ICD-10-CM

## 2015-11-12 DIAGNOSIS — O134 Gestational [pregnancy-induced] hypertension without significant proteinuria, complicating childbirth: Principal | ICD-10-CM | POA: Diagnosis present

## 2015-11-12 DIAGNOSIS — Z6791 Unspecified blood type, Rh negative: Secondary | ICD-10-CM | POA: Diagnosis present

## 2015-11-12 DIAGNOSIS — O26893 Other specified pregnancy related conditions, third trimester: Secondary | ICD-10-CM | POA: Diagnosis present

## 2015-11-12 DIAGNOSIS — Z8249 Family history of ischemic heart disease and other diseases of the circulatory system: Secondary | ICD-10-CM | POA: Diagnosis not present

## 2015-11-12 DIAGNOSIS — Z833 Family history of diabetes mellitus: Secondary | ICD-10-CM

## 2015-11-12 DIAGNOSIS — O2343 Unspecified infection of urinary tract in pregnancy, third trimester: Secondary | ICD-10-CM

## 2015-11-12 DIAGNOSIS — O26899 Other specified pregnancy related conditions, unspecified trimester: Secondary | ICD-10-CM | POA: Diagnosis present

## 2015-11-12 DIAGNOSIS — O09299 Supervision of pregnancy with other poor reproductive or obstetric history, unspecified trimester: Secondary | ICD-10-CM

## 2015-11-12 DIAGNOSIS — Z3A38 38 weeks gestation of pregnancy: Secondary | ICD-10-CM

## 2015-11-12 DIAGNOSIS — O139 Gestational [pregnancy-induced] hypertension without significant proteinuria, unspecified trimester: Secondary | ICD-10-CM | POA: Diagnosis present

## 2015-11-12 DIAGNOSIS — O4292 Full-term premature rupture of membranes, unspecified as to length of time between rupture and onset of labor: Secondary | ICD-10-CM | POA: Diagnosis not present

## 2015-11-12 DIAGNOSIS — Z3A39 39 weeks gestation of pregnancy: Secondary | ICD-10-CM | POA: Diagnosis not present

## 2015-11-12 DIAGNOSIS — O99824 Streptococcus B carrier state complicating childbirth: Secondary | ICD-10-CM | POA: Diagnosis present

## 2015-11-12 DIAGNOSIS — O2340 Unspecified infection of urinary tract in pregnancy, unspecified trimester: Secondary | ICD-10-CM

## 2015-11-12 DIAGNOSIS — O3663X1 Maternal care for excessive fetal growth, third trimester, fetus 1: Secondary | ICD-10-CM

## 2015-11-12 DIAGNOSIS — O09293 Supervision of pregnancy with other poor reproductive or obstetric history, third trimester: Secondary | ICD-10-CM

## 2015-11-12 DIAGNOSIS — O0993 Supervision of high risk pregnancy, unspecified, third trimester: Secondary | ICD-10-CM

## 2015-11-12 DIAGNOSIS — IMO0002 Reserved for concepts with insufficient information to code with codable children: Secondary | ICD-10-CM

## 2015-11-12 LAB — TYPE AND SCREEN
ABO/RH(D): O NEG
Antibody Screen: NEGATIVE

## 2015-11-12 LAB — COMPREHENSIVE METABOLIC PANEL
ALBUMIN: 3 g/dL — AB (ref 3.5–5.0)
ALK PHOS: 142 U/L — AB (ref 38–126)
ALT: 11 U/L — AB (ref 14–54)
AST: 14 U/L — ABNORMAL LOW (ref 15–41)
Anion gap: 9 (ref 5–15)
BUN: 9 mg/dL (ref 6–20)
CALCIUM: 9.2 mg/dL (ref 8.9–10.3)
CO2: 21 mmol/L — AB (ref 22–32)
CREATININE: 0.74 mg/dL (ref 0.44–1.00)
Chloride: 106 mmol/L (ref 101–111)
GFR calc Af Amer: >60 mL/min (ref >60)
GFR calc non Af Amer: >60 mL/min (ref >60)
GLUCOSE: 100 mg/dL — AB (ref 65–99)
Potassium: 4.1 mmol/L (ref 3.5–5.1)
SODIUM: 136 mmol/L (ref 135–145)
Total Bilirubin: 0.3 mg/dL (ref 0.3–1.2)
Total Protein: 7 g/dL (ref 6.5–8.1)

## 2015-11-12 LAB — CBC
HEMATOCRIT: 32.2 % — AB (ref 36.0–46.0)
HEMOGLOBIN: 10.3 g/dL — AB (ref 12.0–15.0)
MCH: 23.1 pg — ABNORMAL LOW (ref 26.0–34.0)
MCHC: 32 g/dL (ref 30.0–36.0)
MCV: 72.2 fL — ABNORMAL LOW (ref 78.0–100.0)
Platelets: 202 10*3/uL (ref 150–400)
RBC: 4.46 MIL/uL (ref 3.87–5.11)
RDW: 16.7 % — ABNORMAL HIGH (ref 11.5–15.5)
WBC: 13.1 10*3/uL — ABNORMAL HIGH (ref 4.0–10.5)

## 2015-11-12 LAB — OB RESULTS CONSOLE GBS: GBS: POSITIVE

## 2015-11-12 LAB — OB RESULTS CONSOLE GC/CHLAMYDIA
GC PROBE AMP, GENITAL: POSITIVE
Gonorrhea: NEGATIVE

## 2015-11-12 LAB — PROTEIN / CREATININE RATIO, URINE
CREATININE, URINE: 85 mg/dL
PROTEIN CREATININE RATIO: 0.18 mg/mg{creat} — AB (ref 0.00–0.15)
TOTAL PROTEIN, URINE: 15 mg/dL

## 2015-11-12 MED ORDER — LACTATED RINGERS IV SOLN
INTRAVENOUS | Status: DC
  Administered 2015-11-12: via INTRAVENOUS

## 2015-11-12 MED ORDER — ONDANSETRON HCL 4 MG/2ML IJ SOLN: 4.0000 mg | Freq: Four times a day (QID) | INTRAMUSCULAR | Status: DC | PRN

## 2015-11-12 MED ORDER — OXYTOCIN 10 UNIT/ML IJ SOLN
2.5000 [IU]/h | INTRAMUSCULAR | Status: DC
  Filled 2015-11-12: qty 4

## 2015-11-12 MED ORDER — CITRIC ACID-SODIUM CITRATE 334-500 MG/5ML PO SOLN: 30.0000 mL | ORAL | Status: DC | PRN

## 2015-11-12 MED ORDER — PENICILLIN G POTASSIUM 5000000 UNITS IJ SOLR
5.0000 10*6.[IU] | Freq: Once | INTRAVENOUS | Status: AC
  Administered 2015-11-12: 5 10*6.[IU] via INTRAVENOUS
  Filled 2015-11-12: qty 5

## 2015-11-12 MED ORDER — OXYTOCIN BOLUS FROM INFUSION
500.0000 mL | INTRAVENOUS | Status: DC
Start: 2015-11-12 — End: 2015-11-13

## 2015-11-12 MED ORDER — MISOPROSTOL 25 MCG QUARTER TABLET
25.0000 ug | ORAL_TABLET | ORAL | Status: DC | PRN
  Administered 2015-11-12 (×2): 25 ug via VAGINAL
  Filled 2015-11-12: qty 1
  Filled 2015-11-12 (×2): qty 0.25

## 2015-11-12 MED ORDER — PENICILLIN G POTASSIUM 5000000 UNITS IJ SOLR
2.5000 10*6.[IU] | INTRAVENOUS | Status: DC
  Administered 2015-11-12 – 2015-11-13 (×2): 2.5 10*6.[IU] via INTRAVENOUS
  Filled 2015-11-12 (×6): qty 2.5

## 2015-11-12 MED ORDER — ACETAMINOPHEN 325 MG PO TABS
650.0000 mg | ORAL_TABLET | ORAL | Status: DC | PRN
  Administered 2015-11-13: 650 mg via ORAL
  Filled 2015-11-12: qty 2

## 2015-11-12 MED ORDER — LACTATED RINGERS IV SOLN: 500.0000 mL | INTRAVENOUS | Status: DC | PRN

## 2015-11-12 MED ORDER — LIDOCAINE HCL (PF) 1 % IJ SOLN
30.0000 mL | INTRAMUSCULAR | Status: DC | PRN
  Filled 2015-11-12: qty 30

## 2015-11-12 MED ORDER — FLEET ENEMA 7-19 GM/118ML RE ENEM: 1.0000 | ENEMA | RECTAL | Status: DC | PRN

## 2015-11-12 NOTE — H&P (Signed)
LABOR ADMISSION HISTORY AND PHYSICAL  Angel Pratt is a 25 y.o. female [id number] with IUP at [redacted]w[redacted]d by LMP c/w 13wk [place] presenting for IOL for PIH. She reports +FM, no contractions, No LOF, no VB, no blurry vision, and RUQ pain. Patient does report constant frontal HA since last night; she has not tried any over the counter medications for the HA. Also notes of swelling in hands and feet for the last week. She plans on breast feeding. Her husband had a vasectomy.  Dating: By LMP c/w 13wk US--->  Estimated Date of Delivery: 11/20/15  Sono:     CWD, normal anatomy, 276g, 37% EFW @ [redacted]w[redacted]d AFI sum 21.5, 3547g 80% EFW   Prenatal History/Complications: Rh negative state Hx of pre-eclampsia GBS UTI complicating pregnancy  Fetal Pyelectasis- resolved   Past Medical History: Past Medical History  Diagnosis Date  . Preeclampsia   . Pregnancy induced hypertension   . Pyelonephritis complicating pregnancy 03/2013  . Anemia   . Ovarian cyst   . Kidney stones     Past Surgical History: Past Surgical History  Procedure Laterality Date  . Tonsillectomy      2009  . Dilation and curettage of uterus      Obstetrical History: OB History    Gravida Para Term Preterm AB TAB SAB Ectopic Multiple Living   Social History: Social History   Social History  . Marital Status: Married    Spouse Name: N/A  . Number of Children: N/A  . Years of Education: N/A   Social History Main Topics  . Smoking status: Never Smoker   . Smokeless tobacco: Never Used  . Alcohol Use: No  . Drug Use: No  . Sexual Activity:    Partners: Male    Birth Control/ Protection: Condom   Other Topics Concern  . None   Social History Narrative    Family History: Family History  Problem Relation Age of Onset  . Cancer Mother     cervial dysplasia  . Ovarian cysts Mother   . Cancer Maternal Aunt     cervical  . Cancer Maternal Grandmother 40    Cervical and Breast one breast   . Ovarian cysts Maternal Grandmother   . Heart disease Maternal Grandmother   . Diabetes Maternal Grandfather   . Heart disease Maternal Grandfather     Allergies: Allergies  Allergen Reactions  . Codeine Hives    Pt says she can take vicodin  . Latex Hives    Prescriptions prior to admission  Medication Sig Dispense Refill Last Dose  . loratadine (CLARITIN) 10 MG tablet Take 10 mg by mouth daily.    Taking  . Prenatal Multivit-Min-Fe-FA (PRENATAL VITAMINS) 0.8 MG tablet Take 1 tablet by mouth daily. 30 tablet 12 Taking     Review of Systems   All systems reviewed and negative except as stated in HPI  BP 137/91 mmHg  Pulse 112  Temp(Src) 97.2 F (36.2 C) (Oral)  Ht  (1.575 m)  Wt 97.977 kg (216 lb)  BMI 39.50 kg/m2  LMP 02/13/2015 General appearance: alert and cooperative Lungs: clear to auscultation bilaterally Heart: regular rate and rhythm Abdomen: soft, non-tender; bowel sounds normal Extremities: Homans sign is negative, no sign of DVT, edema DTR's: 2+ patellar bilaterally, no clonus  Fetal monitoringBaseline: 140 bpm, Variability: Good {> 6 bpm), Accelerations: Reactive and Decelerations: Variable: mild x1 over the  last 10 minutes Uterine activity: every 4.5 mins     Prenatal labs: ABO, Rh: --/--/O NEG (11/24 2343) Antibody: NEG (11/24 2343) Rubella: Immune RPR: NON REAC (11/28 1011)  HBsAg: NEGATIVE (08/15 1405)  HIV: NONREACTIVE (11/28 1011)  GBS:   GBS UTI during pregancy  1 hr Glucola 155 (3rd trimester) Genetic screening: QUAD nml Anatomy US: nml  Prenatal Transfer Tool  Maternal Diabetes: No Genetic Screening: Normal (quad) Maternal Ultrasounds/Referrals: Normal Fetal Ultrasounds or other Referrals:  None Maternal Substance Abuse:  No Significant Maternal Medications:  None Significant Maternal Lab Results: GBS POS, bacteruria   Results for orders placed or performed during the hospital encounter of 11/12/15 (from the past 24 hour(s))   OB RESULTS CONSOLE GC/Chlamydia   Collection Time: 11/12/15 12:00 AM  Result Value Ref Range   Gonorrhea Positive     Patient Active Problem List   Diagnosis Date Noted  . Pregnancy induced hypertension, antepartum 11/12/2015  . Fetal pyelectasis 08/21/2015  . GBS (group B streptococcus) UTI complicating pregnancy 05/23/2015  . Supervision of high-risk pregnancy 11/15/2012  . Hx of preeclampsia, prior pregnancy, currently pregnant 11/15/2012  . Cholelithiasis   . Rh negative state in antepartum period 11/01/2012    Assessment: Angel Pratt is a 25 y.o. [id number] at [redacted]w[redacted]d here for IOL for new diagnosis of PIH  #Labor: IOL with cytotec #Pain: patient desire to try to deliver without pain medications, but is open to epidural  #FWB: Cat 2; one variable decel otherwise good baseline, mod variability, and + accels  #ID: hx of GBS UTI during current pregnancy-PCN #MOF: breast #MOC: vasectomy #Circ: inpatient  # PIH: pre-e labs ordered. Monitor BP and HA. Tylenol for HA PRN  Palma Holter, MD PGY 1 Family Medicine    OB fellow attestation: I have seen and examined this patient; I agree with above documentation in the resident's note.   Angel Pratt is a 25 y.o. A5W0981 here for IOL due to pregnancy induced hypertension.   PE: BP 134/99 mmHg  Pulse 105  Temp(Src) 97.2 F (36.2 C) (Oral)  Ht  (1.575 m)  Wt 216 lb (97.977 kg)  BMI 39.50 kg/m2  LMP 02/13/2015 Gen: calm comfortable, NAD Resp: normal effort, no distress Abd: gravid  ROS, labs, PMH reviewed  Plan: Admit to LD for IOL for gHTN defined by two elevated BP including recently 167/107 without lab abnormalities.  GBS pos, start PCN, consider ROM when >4 hours Breastfeeding Partner already has vasectomy Planning for inpatient circumcision  Federico Flake, MD Family Medicine, OB Fellow 11/12/2015, 7:44 PM

## 2015-11-12 NOTE — Progress Notes (Signed)
Subjective:  Angel Pratt is a 25 y.o. [id number] at [redacted]w[redacted]d being seen today for ongoing prenatal care.  She is currently monitored for the following issues for this high-risk pregnancy and has Rh negative state in antepartum period; Cholelithiasis; Supervision of high-risk pregnancy; Hx of preeclampsia, prior pregnancy, currently pregnant; GBS (group B streptococcus) UTI complicating pregnancy; and Fetal pyelectasis on her problem list.  Patient reports headache and and just feels bad.   .  .   . Denies leaking of fluid.   The following portions of the patient's history were reviewed and updated as appropriate: allergies, current medications, past family history, past medical history, past social history, past surgical history and problem list. Problem list updated.  Objective:  There were no vitals filed for this visit.  Fetal Status:   Fundal Height: 43 cm       General:  Alert, oriented and cooperative. Patient is in no acute distress.  Skin: Skin is warm and dry. No rash noted.   Cardiovascular: Normal heart rate noted  Respiratory: Normal respiratory effort, no problems with respiration noted  Abdomen: Soft, gravid, appropriate for gestational age.       Pelvic:       Cervical exam deferred        Extremities: Normal range of motion.     Mental Status: Normal mood and affect. Normal behavior. Normal judgment and thought content.   Urinalysis:     DTR 3+ bilaterally and equal Assessment and Plan:  Pregnancy: [id number] at [redacted]w[redacted]d  1. Supervision of high-risk pregnancy, third trimester   2. GBS (group B streptococcus) UTI complicating pregnancy, third trimester   3. Hx of preeclampsia, prior pregnancy, currently pregnant, third trimester - She will go to [facility] for [facility]OL  Term labor symptoms and general obstetric precautions including but not limited to vaginal bleeding, contractions, leaking of fluid and fetal movement were reviewed in detail with the patient. Please refer to After  Visit Summary for other counseling recommendations.  Return in about 6 weeks (around 12/24/2015).   Allie Bossier, MD

## 2015-11-12 NOTE — Progress Notes (Signed)
Recheck 148/100, pt c/o headache and "not feeling well".

## 2015-11-13 ENCOUNTER — Other Ambulatory Visit: Payer: 59

## 2015-11-13 ENCOUNTER — Ambulatory Visit (HOSPITAL_COMMUNITY): Payer: 59

## 2015-11-13 ENCOUNTER — Encounter (HOSPITAL_COMMUNITY): Payer: Self-pay | Admitting: *Deleted

## 2015-11-13 DIAGNOSIS — Z3A39 39 weeks gestation of pregnancy: Secondary | ICD-10-CM

## 2015-11-13 DIAGNOSIS — O4292 Full-term premature rupture of membranes, unspecified as to length of time between rupture and onset of labor: Secondary | ICD-10-CM

## 2015-11-13 DIAGNOSIS — O134 Gestational [pregnancy-induced] hypertension without significant proteinuria, complicating childbirth: Secondary | ICD-10-CM

## 2015-11-13 LAB — CBC
HEMATOCRIT: 33.2 % — AB (ref 36.0–46.0)
Hemoglobin: 10.5 g/dL — ABNORMAL LOW (ref 12.0–15.0)
MCH: 22.6 pg — AB (ref 26.0–34.0)
MCHC: 31.6 g/dL (ref 30.0–36.0)
MCV: 71.4 fL — AB (ref 78.0–100.0)
Platelets: 288 10*3/uL (ref 150–400)
RBC: 4.65 MIL/uL (ref 3.87–5.11)
RDW: 16.7 % — ABNORMAL HIGH (ref 11.5–15.5)
WBC: 16.5 10*3/uL — AB (ref 4.0–10.5)

## 2015-11-13 LAB — RPR: RPR Ser Ql: NONREACTIVE

## 2015-11-13 MED ORDER — PRENATAL MULTIVITAMIN CH
1.0000 | ORAL_TABLET | Freq: Every day | ORAL | Status: DC
  Administered 2015-11-13 – 2015-11-14 (×2): 1 via ORAL
  Filled 2015-11-13 (×2): qty 1

## 2015-11-13 MED ORDER — BENZOCAINE-MENTHOL 20-0.5 % EX AERO
1.0000 "application " | INHALATION_SPRAY | CUTANEOUS | Status: DC | PRN
  Administered 2015-11-13: 1 via TOPICAL
  Filled 2015-11-13: qty 56

## 2015-11-13 MED ORDER — WITCH HAZEL-GLYCERIN EX PADS: 1.0000 "application " | MEDICATED_PAD | CUTANEOUS | Status: DC | PRN

## 2015-11-13 MED ORDER — DIBUCAINE 1 % RE OINT: 1.0000 "application " | TOPICAL_OINTMENT | RECTAL | Status: DC | PRN

## 2015-11-13 MED ORDER — ZOLPIDEM TARTRATE 5 MG PO TABS: 5.0000 mg | ORAL_TABLET | Freq: Every evening | ORAL | Status: DC | PRN

## 2015-11-13 MED ORDER — SENNOSIDES-DOCUSATE SODIUM 8.6-50 MG PO TABS
2.0000 | ORAL_TABLET | ORAL | Status: DC
  Filled 2015-11-13: qty 2

## 2015-11-13 MED ORDER — DIPHENHYDRAMINE HCL 25 MG PO CAPS: 25.0000 mg | ORAL_CAPSULE | Freq: Four times a day (QID) | ORAL | Status: DC | PRN

## 2015-11-13 MED ORDER — LANOLIN HYDROUS EX OINT: TOPICAL_OINTMENT | CUTANEOUS | Status: DC | PRN

## 2015-11-13 MED ORDER — ACETAMINOPHEN 325 MG PO TABS: 650.0000 mg | ORAL_TABLET | ORAL | Status: DC | PRN

## 2015-11-13 MED ORDER — TETANUS-DIPHTH-ACELL PERTUSSIS 5-2.5-18.5 LF-MCG/0.5 IM SUSP: 0.5000 mL | Freq: Once | INTRAMUSCULAR | Status: DC

## 2015-11-13 MED ORDER — FENTANYL 2.5 MCG/ML BUPIVACAINE 1/10 % EPIDURAL INFUSION (WH - ANES): 14.0000 mL/h | INTRAMUSCULAR | Status: DC | PRN

## 2015-11-13 MED ORDER — IBUPROFEN 600 MG PO TABS
600.0000 mg | ORAL_TABLET | Freq: Four times a day (QID) | ORAL | Status: DC
  Administered 2015-11-13 – 2015-11-14 (×6): 600 mg via ORAL
  Filled 2015-11-13 (×6): qty 1

## 2015-11-13 MED ORDER — SIMETHICONE 80 MG PO CHEW: 80.0000 mg | CHEWABLE_TABLET | ORAL | Status: DC | PRN

## 2015-11-13 MED ORDER — ONDANSETRON HCL 4 MG PO TABS: 4.0000 mg | ORAL_TABLET | ORAL | Status: DC | PRN

## 2015-11-13 MED ORDER — ONDANSETRON HCL 4 MG/2ML IJ SOLN: 4.0000 mg | INTRAMUSCULAR | Status: DC | PRN

## 2015-11-13 MED ORDER — EPHEDRINE 5 MG/ML INJ
10.0000 mg | INTRAVENOUS | Status: DC | PRN
  Filled 2015-11-13: qty 2

## 2015-11-13 MED ORDER — PHENYLEPHRINE 40 MCG/ML (10ML) SYRINGE FOR IV PUSH (FOR BLOOD PRESSURE SUPPORT)
80.0000 ug | PREFILLED_SYRINGE | INTRAVENOUS | Status: DC | PRN
  Filled 2015-11-13: qty 2

## 2015-11-13 MED ORDER — RHO D IMMUNE GLOBULIN 1500 UNIT/2ML IJ SOSY
300.0000 ug | PREFILLED_SYRINGE | Freq: Once | INTRAMUSCULAR | Status: AC
  Administered 2015-11-13: 300 ug via INTRAVENOUS
  Filled 2015-11-13: qty 2

## 2015-11-13 MED ORDER — DIPHENHYDRAMINE HCL 50 MG/ML IJ SOLN: 12.5000 mg | INTRAMUSCULAR | Status: DC | PRN

## 2015-11-13 NOTE — Progress Notes (Signed)
Labor Progress Note Angel Pratt is a 25 y.o. [id number] at [redacted]w[redacted]d presented for IOL for gHTN.  S: Pt having lots of pain and very frequent ctx (q34m), breathing through them.  She is waiting for epidural.   O:  BP 148/112 mmHg  Pulse 89  Temp(Src) 97.6 F (36.4 C) (Oral)  Resp 18  Ht  (1.575 m)  Wt 97.977 kg (216 lb)  BMI 39.50 kg/m2  LMP 02/13/2015 EFM: 130s/mod/+accls  CVE: 5.5/100/0, bulging bag   A&P: 25 y.o. A5W0981 [redacted]w[redacted]d in active labor - IOL for gHTN. #Labor: pt now in active labor; s/p cytotec; plan for AROM after epidural #Pain: epidural pending #FWB: cat 1 #GBS: pos - PCN x3  Wynne Dust, MD 3:37 AM

## 2015-11-13 NOTE — Lactation Note (Addendum)
This note was copied from a baby's chart. Lactation Consultation Note Mom pumped and bottle fed her 1st child d/t unable to latch d/t flat nipples. Has very large soft breast w/nipple when compressed nipple flattens. Hand expressed great colostrum. Mom stated breast had been leaking for several weeks. Mom is a little sluggish d/t being tired and in pain. Baby fussy cueing wanting to BF. Assisted in football hold. Fitted nipple #16 NS. Inserted colostrum in NS and latched baby. Elevated pendulum breast w/dry wash cloth. Encouraged mom to use "C" hold when latching to firm up breast. Large breast tends to over flow on baby's nose, encouraged mom to relaxed, repositioned baby back w/chin closer to breast and head tilted w/angle upright still having cheek to breast for deep latch. Reviewed basic BF care, positioning, STS, I&O, supply and demand. Application of NS taught and mom teach back, not proper. Demonstrated again how to apply. May take several attempts for mom to get right d/t tiredness, FOB at bedside, falling asleep 1/2 through teaching. Shells given to wear in bra to evert nipple for deeper latch. Mom had worn these with her last child. Reviewed application. Hand pump given to evert nipple for border out line for NS application. Baby suckled well to breast, pops off and on frequently and screaming when not on breast. A lot of teaching done in regards of latching, NS, positioning.  Patient Name: Angel Pratt [id number] Date: 11/13/2015 Reason for consult: Initial assessment   Maternal Data Has patient been taught Hand Expression?: Yes Does the patient have breastfeeding experience prior to this delivery?: Yes  Feeding Feeding Type: Breast Fed Length of feed: 30 min  LATCH Score/Interventions Latch: Repeated attempts needed to sustain latch, nipple held in mouth throughout feeding, stimulation needed to elicit sucking reflex. Intervention(s): Adjust position;Assist with latch  Audible  Swallowing: A few with stimulation Intervention(s): Skin to skin;Hand expression  Type of Nipple: Flat Intervention(s): Reverse pressure;Shells;Hand pump  Comfort (Breast/Nipple): Soft / non-tender     Hold (Positioning): Assistance needed to correctly position infant at breast and maintain latch. Intervention(s): Breastfeeding basics reviewed;Support Pillows;Position options;Skin to skin  LATCH Score: 6  Lactation Tools Discussed/Used Tools: Shells;Nipple Dorris Carnes;Pump Nipple shield size: 16 Shell Type: Inverted Breast pump type: Manual Pump Review: Milk Storage;Setup, frequency, and cleaning Initiated by:: Peri Jefferson RN Date initiated:: 11/13/15   Consult Status Consult Status: Follow-up Date: 11/13/15 Follow-up type: In-patient    Charyl Dancer 11/13/2015, 6:25 AM

## 2015-11-13 NOTE — Progress Notes (Signed)
Patient ID: Angel Pratt, female   DOB: 1991-05-14, 25 y.o.   MRN: [id number] LAZARIA SCHABEN is a 25 y.o. [id number] at [redacted]w[redacted]d.  Subjective: Moderate cramping.   Objective: BP 138/83 mmHg  Pulse 61  Temp(Src) 97.6 F (36.4 C) (Oral)  Resp 18  Ht  (1.575 m)  Wt 216 lb (97.977 kg)  BMI 39.50 kg/m2  LMP 02/13/2015   FHT:  FHR: 150 bpm, variability: mod,  accelerations:  15x15,  decelerations:  none UC:   Q 2-5 minutes, mild-mod  VE: Deferred  Labs: Results for orders placed or performed during the hospital encounter of 11/12/15 (from the past 24 hour(s))  CBC     Status: Abnormal   Collection Time: 11/12/15  2:57 PM  Result Value Ref Range   WBC 13.1 (H) 4.0 - 10.5 K/uL   RBC 4.46 3.87 - 5.11 MIL/uL   Hemoglobin 10.3 (L) 12.0 - 15.0 g/dL   HCT 14.7 (L) 82.9 - 56.2 %   MCV 72.2 (L) 78.0 - 100.0 fL   MCH 23.1 (L) 26.0 - 34.0 pg   MCHC 32.0 30.0 - 36.0 g/dL   RDW 13.0 (H) 86.5 - 78.4 %   Platelets 202 150 - 400 K/uL  Protein / creatinine ratio, urine     Status: Abnormal   Collection Time: 11/12/15  3:30 PM  Result Value Ref Range   Creatinine, Urine 85.00 mg/dL   Total Protein, Urine 15 mg/dL   Protein Creatinine Ratio 0.18 (H) 0.00 - 0.15 mg/mg[Cre]  Type and screen     Status: None   Collection Time: 11/12/15  4:46 PM  Result Value Ref Range   ABO/RH(D) O NEG    Antibody Screen NEG    Sample Expiration 11/15/2015   Comprehensive metabolic panel     Status: Abnormal   Collection Time: 11/12/15  4:46 PM  Result Value Ref Range   Sodium 136 135 - 145 mmol/L   Potassium 4.1 3.5 - 5.1 mmol/L   Chloride 106 101 - 111 mmol/L   CO2 21 (L) 22 - 32 mmol/L   Glucose, Bld 100 (H) 65 - 99 mg/dL   BUN 9 6 - 20 mg/dL   Creatinine, Ser 6.96 0.44 - 1.00 mg/dL   Calcium 9.2 8.9 - 29.5 mg/dL   Total Protein 7.0 6.5 - 8.1 g/dL   Albumin 3.0 (L) 3.5 - 5.0 g/dL   AST 14 (L) 15 - 41 U/L   ALT 11 (L) 14 - 54 U/L   Alkaline Phosphatase 142 (H) 38 - 126 U/L   Total Bilirubin  0.3 0.3 - 1.2 mg/dL   GFR calc non Af Amer >60 >60 mL/min   GFR calc Af Amer >60 >60 mL/min   Anion gap 9 5 - 15  CBC     Status: Abnormal   Collection Time: 11/13/15  1:58 AM  Result Value Ref Range   WBC 16.5 (H) 4.0 - 10.5 K/uL   RBC 4.65 3.87 - 5.11 MIL/uL   Hemoglobin 10.5 (L) 12.0 - 15.0 g/dL   HCT 28.4 (L) 13.2 - 44.0 %   MCV 71.4 (L) 78.0 - 100.0 fL   MCH 22.6 (L) 26.0 - 34.0 pg   MCHC 31.6 30.0 - 36.0 g/dL   RDW 10.2 (H) 72.5 - 36.6 %   Platelets 288 150 - 400 K/uL    Assessment / Plan: [redacted]w[redacted]d week IUP Labor: Early Fetal Wellbeing:  Category I Pain Control:  None Anticipated MOD:  SVD  Will recheck at 0100 when next cytotec due.   [place], [place] 11/12/2015 11:49 PM

## 2015-11-14 ENCOUNTER — Ambulatory Visit: Payer: Self-pay

## 2015-11-14 LAB — RH IG WORKUP (INCLUDES ABO/RH)
ABO/RH(D): O NEG
FETAL SCREEN: NEGATIVE
Gestational Age(Wks): 39
UNIT DIVISION: 0

## 2015-11-14 MED ORDER — IBUPROFEN 600 MG PO TABS: 600.0000 mg | ORAL_TABLET | Freq: Four times a day (QID) | ORAL | Status: DC | PRN

## 2015-11-14 NOTE — Discharge Instructions (Signed)

## 2015-11-14 NOTE — Discharge Summary (Signed)
Obstetric Discharge Summary Reason for Admission: induction of labor for pregnancy induced hypertension Prenatal Procedures: none Intrapartum Procedures: spontaneous vaginal delivery and GBS prophylaxis Postpartum Procedures: antibiotics and Rho(D) Ig Complications-Operative and Postpartum: none  Delivery Note At 2:43 AM a viable female was delivered via Vaginal, Spontaneous Delivery (Presentation: Right Occiput Anterior).  APGAR: 8, 9; weight 7 lb 7.8 oz (3395 g).   Placenta status: Intact, Spontaneous.  Cord: 3 vessels with the following complications: None.  Anesthesia: None  Episiotomy: None Lacerations: None Est. Blood Loss (mL): 50  Mom to postpartum.  Baby to Couplet care / Skin to Skin.   Hospital Course:  Principal Problem:   Pregnancy induced hypertension, antepartum Active Problems:   Rh negative state in antepartum period   Hx of preeclampsia, prior pregnancy, currently pregnant   GBS (group B streptococcus) UTI complicating pregnancy   Fetal pyelectasis   Angel Pratt is a 25 y.o. [id number] s/p SVD after IOL for new pregnancy induced hypertension.  Patient was admitted 2/7.  She has postpartum course that was uncomplicated including no problems with ambulating, PO intake, urination, pain, or bleeding. BP elevated to 130s/90s on admission and were within normal limits during induction and following delivery. The pt feels ready to go home and  will be discharged with outpatient follow-up.   Today: No acute events overnight.  Pt denies problems with ambulating, voiding or po intake.  She denies nausea or vomiting.  Pain is well controlled.  She has had flatus. She has had bowel movement.  Lochia Small.  Plan for birth control is  vasectomy (already completed).  Method of Feeding: Breast, pumping  Physical Exam:  General: alert, cooperative, no distress and comfortable sitting in chair Uterine Fundus: firm, 1-2 FB below level of umbilicus DVT Evaluation: No cords or  calf tenderness. Extremities: Bilateral 2+ non-pitting edema in feet. Improved since delivery per patient.   H/H: Lab Results  Component Value Date/Time   HGB 10.5* 11/13/2015 01:58 AM   HCT 33.2* 11/13/2015 01:58 AM    Discharge Diagnoses: Term Pregnancy-delivered  Discharge Information: Date: 11/14/2015 Activity: pelvic rest Diet: routine  Medications: see below Breast feeding:  Yes Condition: stable Instructions: refer to handout Discharge to: home     Medication List    STOP taking these medications        loratadine 10 MG tablet  Commonly known as:  CLARITIN      TAKE these medications        ibuprofen 600 MG tablet  Commonly known as:  ADVIL,MOTRIN  Take 1 tablet (600 mg total) by mouth every 6 (six) hours as needed.     Prenatal Vitamins 0.8 MG tablet  Take 1 tablet by mouth daily.           Follow-up Information    Follow up with [facility]. Schedule an appointment as soon as possible for a visit in 6 weeks.   Specialty:  Obstetrics and Gynecology   Why:  For your postpartum appointment.   Contact information:   [address] [place] [address] [phone]      Ann Maki ,MS3 11/14/2015,9:05 AM  CNM attestation  Angel Pratt is a 25 y.o. [id number] s/p SVD after IOL for new onset gHTN.   Pain is well controlled.  Plan for birth control is vasectomy.  Method of Feeding: breast  PE:  BP 106/62 mmHg  Pulse 96  Temp(Src) 98.1 F (36.7 C) (Oral)  Resp 19  Ht  (1.575 m)  Wt 97.977 kg (216 lb)  BMI 39.50 kg/m2  SpO2 98%  LMP 02/13/2015  Breastfeeding? Unknown Fundus firm   Recent Labs  11/13/15 0158  HGB 10.5*  HCT 33.2*     Plan: discharge today - postpartum care discussed - f/u clinic in 6 weeks for postpartum visit   Zanasia Hickson, CNM 12:18 AM

## 2015-11-14 NOTE — Lactation Note (Signed)
This note was copied from a baby's chart. Lactation Consultation Note  Mother reports that she is putting the baby to the breast using the nipple shield.  The baby is also eating formula.  Reviewed supply and demand and the importance of always BF first and post-pumping related to NS use.  Mom has inverted nipples.  They look erect when they are stimulated but inverted with the pinch test.  Explained to mom that pre-pumping will help to evert the nipples. Reviewed how to apply NS.  Offered mother an OP patient appointment but she shared that her insurance company provided in house lactation services.  Encouraged her to take advantage of this program.  She is aware of support groups offered at the hospital.  Patient Name: Angel Pratt [id number] Date: 11/14/2015     Maternal Data    Feeding Feeding Type: Bottle Fed - Formula Nipple Type: Slow - flow  LATCH Score/Interventions                      Lactation Tools Discussed/Used     Consult Status      Angel Pratt 11/14/2015, 3:02 PM

## 2015-11-18 ENCOUNTER — Encounter: Payer: 59 | Admitting: Obstetrics & Gynecology

## 2015-12-23 ENCOUNTER — Encounter: Payer: Self-pay | Admitting: *Deleted

## 2015-12-25 ENCOUNTER — Ambulatory Visit: Payer: 59 | Admitting: Obstetrics & Gynecology

## 2016-01-01 ENCOUNTER — Ambulatory Visit: Payer: 59 | Admitting: Obstetrics & Gynecology

## 2016-01-08 ENCOUNTER — Ambulatory Visit (INDEPENDENT_AMBULATORY_CARE_PROVIDER_SITE_OTHER): Payer: 59 | Admitting: Obstetrics and Gynecology

## 2016-01-08 ENCOUNTER — Encounter: Payer: Self-pay | Admitting: Obstetrics and Gynecology

## 2016-01-08 NOTE — Progress Notes (Signed)
  Subjective:     Angel Pratt is a 25 y.o. female who presents for a postpartum visit. She is 8 weeks postpartum following a spontaneous vaginal delivery. I have fully reviewed the prenatal and intrapartum course. The delivery was at 38.6 gestational weeks [facility] Outcome: spontaneous vaginal delivery. Anesthesia: none. Postpartum course has been uncomplicated. Baby's course has been uncomplicated. Baby is feeding by breast. Bleeding normal period. Bowel function is normal. Bladder function is normal. Patient is sexually active. Contraception method is condoms and vasectomy. Postpartum depression screening: negative.     Review of Systems Pertinent items are noted in HPI.   Objective:    BP 123/85 mmHg  Pulse 93  Resp 16  Ht 5\' 2"  (1.575 m)  Wt 194 lb (87.998 kg)  BMI 35.47 kg/m2  LMP 12/23/2015  Breastfeeding? Yes  General:  alert, cooperative and no distress   Breasts:  inspection negative, no nipple discharge or bleeding, no masses or nodularity palpable  Lungs: clear to auscultation bilaterally  Heart:  regular rate and rhythm  Abdomen: soft, non-tender; bowel sounds normal; no masses,  no organomegaly   Vulva:  normal  Vagina: normal vagina, no discharge, exudate, lesion, or erythema  Cervix:  multiparous appearance  Corpus: normal size, contour, position, consistency, mobility, non-tender  Adnexa:  normal adnexa and no mass, fullness, tenderness  Rectal Exam: Not performed.        Assessment:     Normal postpartum exam. Pap smear not done at today's visit.   Plan:    1. Contraception: vasectomy 2. Patient is medically cleared to resume all activities of daily living. Discussed increased risk of developing HTN later in life due to 2 pregnancies complicated with PIH. Encouraged the patient to start and maintain healthy habits 3. Follow up in: 3 months for annual exam or as needed.

## 2016-01-29 ENCOUNTER — Telehealth (HOSPITAL_COMMUNITY): Payer: Self-pay | Admitting: Lactation Services

## 2016-01-29 NOTE — Telephone Encounter (Signed)
Called the number that was on the log. That was Lizette's grandmother who gave me her cell phone. Shanda BumpsJessica did not answer her cell phone and left a message.

## 2016-06-16 ENCOUNTER — Encounter: Payer: Self-pay | Admitting: Obstetrics & Gynecology

## 2016-06-16 ENCOUNTER — Ambulatory Visit (INDEPENDENT_AMBULATORY_CARE_PROVIDER_SITE_OTHER): Payer: 59 | Admitting: Obstetrics & Gynecology

## 2016-06-16 DIAGNOSIS — N921 Excessive and frequent menstruation with irregular cycle: Secondary | ICD-10-CM | POA: Diagnosis not present

## 2016-06-16 MED ORDER — NORGESTIMATE-ETH ESTRADIOL 0.25-35 MG-MCG PO TABS: 1.0000 | ORAL_TABLET | Freq: Every day | ORAL | 5 refills | Status: DC

## 2016-06-16 NOTE — Patient Instructions (Signed)

## 2016-06-16 NOTE — Progress Notes (Signed)
   GYNECOLOGY VISIT NOTE  History:  25 y.o. [id number] here today for management of irregular periods.  Started about a couple of months ago, after she stopped breastfeeding her 577 month old.  Reports this always happens after she stops breastfeeding, has ben on Sprintec in the past to help with period regulation. Wants to restart this now.  Husband had vasectomy, she just needs Sprintec for menstrual control. No associated symptoms. She denies any abnormal vaginal discharge, pelvic pain, presyncopal symptoms or other concerns.   Past Medical History:  Diagnosis Date  . Anemia   . Kidney stones   . Ovarian cyst   . Preeclampsia   . Pregnancy induced hypertension   . Pyelonephritis complicating pregnancy 03/2013    Past Surgical History:  Procedure Laterality Date  . DILATION AND CURETTAGE OF UTERUS    . TONSILLECTOMY     2009    The following portions of the patient's history were reviewed and updated as appropriate: allergies, current medications, past family history, past medical history, past social history, past surgical history and problem list.   Health Maintenance:  Normal pap on 04/22/15.   Review of Systems:  Pertinent items noted in HPI and remainder of comprehensive ROS otherwise negative.  Objective:  Physical Exam BP 125/85 (BP Location: Left Arm, Patient Position: Sitting, Cuff Size: Large)   Pulse (!) 105   Resp 20   Ht 5\' 2"  (1.575 m)   Wt 196 lb (88.9 kg)   LMP 06/07/2016   BMI 35.85 kg/m  CONSTITUTIONAL: Well-developed, well-nourished female in no acute distress.  HENT:  Normocephalic, atraumatic. External right and left ear normal. Oropharynx is clear and moist EYES: Conjunctivae and EOM are normal. Pupils are equal, round, and reactive to light. No scleral icterus.  NECK: Normal range of motion, supple, no masses SKIN: Skin is warm and dry. No rash noted. Not diaphoretic. No erythema. No pallor. NEUROLOGIC: Alert and oriented to person, place, and time.  Normal reflexes, muscle tone coordination. No cranial nerve deficit noted. PSYCHIATRIC: Normal mood and affect. Normal behavior. Normal judgment and thought content. CARDIOVASCULAR: Normal heart rate noted RESPIRATORY: Effort and breath sounds normal, no problems with respiration noted ABDOMEN: Soft, no distention noted.   PELVIC: Deferred MUSCULOSKELETAL: Normal range of motion. No edema noted.   Assessment & Plan:  Menometrorrhagia Will prescribe Sprintec and monitor response - norgestimate-ethinyl estradiol (ORTHO-CYCLEN,SPRINTEC,PREVIFEM) 0.25-35 MG-MCG tablet; Take 1 tablet by mouth daily.  Dispense: 3 Package; Refill: 5 Routine preventative health maintenance measures emphasized. Please refer to After Visit Summary for other counseling recommendations.   Return in about 3 months (around 09/15/2016), or if symptoms worsen or fail to improve, for OCP/BP check.   Total face-to-face time with patient: 15 minutes. Over 50% of encounter was spent on counseling and coordination of care.   Jaynie CollinsUGONNA  ANYANWU, MD, FACOG Attending Obstetrician & Gynecologist, [facility] for [facility], [facility]

## 2016-07-24 ENCOUNTER — Telehealth: Payer: Self-pay | Admitting: *Deleted

## 2016-07-24 NOTE — Telephone Encounter (Signed)
-----   Message from Olevia BowensJacinda S Battle sent at 07/23/2016  8:08 AM EDT ----- Regarding: AUB Contact: [phone] Recently came in about AUB, gave her an OCP to stop it, states it stopped for about 6 days and started right back, wants to know if she can try something different. States she is soaking through a pad in about 1.5hrs

## 2016-07-24 NOTE — Telephone Encounter (Signed)
-----   Message from Jacinda S Battle sent at 07/23/2016  8:08 AM EDT ----- Regarding: AUB Contact: [phone] Recently came in about AUB, gave her an OCP to stop it, states it stopped for about 6 days and started right back, wants to know if she can try something different. States she is soaking through a pad in about 1.5hrs 

## 2016-07-24 NOTE — Telephone Encounter (Signed)
Made pt appt with Dr Jolayne Pantheronstant for IUD insert.

## 2016-08-12 ENCOUNTER — Encounter: Payer: Self-pay | Admitting: *Deleted

## 2016-08-12 ENCOUNTER — Encounter: Payer: Self-pay | Admitting: Obstetrics and Gynecology

## 2016-08-12 ENCOUNTER — Ambulatory Visit (INDEPENDENT_AMBULATORY_CARE_PROVIDER_SITE_OTHER): Payer: 59 | Admitting: Obstetrics and Gynecology

## 2016-08-12 VITALS — BP 116/80 | HR 94 | Resp 18 | Ht 62.0 in | Wt 196.0 lb

## 2016-08-12 DIAGNOSIS — Z30430 Encounter for insertion of intrauterine contraceptive device: Secondary | ICD-10-CM

## 2016-08-12 MED ORDER — LEVONORGESTREL 18.6 MCG/DAY IU IUD
INTRAUTERINE_SYSTEM | Freq: Once | INTRAUTERINE | Status: AC
  Administered 2016-08-12: 09:00:00 via INTRAUTERINE

## 2016-08-12 NOTE — Addendum Note (Signed)
Addended by: Gita KudoLASSITER, KRISTEN S on: 08/12/2016 09:13 AM   Modules accepted: Orders

## 2016-08-12 NOTE — Progress Notes (Signed)
IUD Procedure Note Patient identified, informed consent performed, signed copy in chart, time out was performed.  Urine pregnancy test negative.  Speculum placed in the vagina.  Cervix visualized.  Cleaned with Betadine x 2.  Grasped anteriorly with a single tooth tenaculum.  Uterus sounded to 8 cm.  Liletta IUD placed per manufacturer's recommendations.  Strings trimmed to 3 cm. Tenaculum was removed, good hemostasis noted.  Patient tolerated procedure well.   Patient given post procedure instructions and Mirena care card with expiration date.  Patient is asked to check IUD strings periodically and follow up in 4-6 weeks for IUD check.

## 2016-09-11 ENCOUNTER — Ambulatory Visit (INDEPENDENT_AMBULATORY_CARE_PROVIDER_SITE_OTHER): Payer: 59 | Admitting: Obstetrics and Gynecology

## 2016-09-11 VITALS — BP 136/81 | HR 112 | Wt 197.0 lb

## 2016-09-11 DIAGNOSIS — Z30431 Encounter for routine checking of intrauterine contraceptive device: Secondary | ICD-10-CM | POA: Diagnosis not present

## 2016-09-11 NOTE — Progress Notes (Signed)
25 yo here for IUD check. IUD was placed on 08/12/2016. She reports feeling well without complaints. She denies abdominal/pelvic pain. She reports persistent vaginal bleeding. She has continued taking her OCP with some improvement but nothing significant. Patient has been sexually active without complaints.  Past Medical History:  Diagnosis Date  . Anemia   . Kidney stones   . Ovarian cyst   . Preeclampsia   . Pregnancy induced hypertension   . Pyelonephritis complicating pregnancy 03/2013   Past Surgical History:  Procedure Laterality Date  . DILATION AND CURETTAGE OF UTERUS    . TONSILLECTOMY     2009   Family History  Problem Relation Age of Onset  . Cancer Mother     cervial dysplasia  . Ovarian cysts Mother   . Cancer Maternal Aunt     cervical  . Cancer Maternal Grandmother 40    Cervical and Breast one breast  . Ovarian cysts Maternal Grandmother   . Heart disease Maternal Grandmother   . Diabetes Maternal Grandfather   . Heart disease Maternal Grandfather    Social History  Substance Use Topics  . Smoking status: Never Smoker  . Smokeless tobacco: Never Used  . Alcohol use No   ROS See pertinent in HPI  GENERAL: Well-developed, well-nourished female in no acute distress.  ABDOMEN: Soft, nontender, nondistended. No organomegaly. PELVIC: Normal external female genitalia. Vagina is pink and rugated.  Normal discharge. Normal appearing cervix with IUD strings extending 2 cm from the os. Uterus is normal in size. No adnexal mass or tenderness. EXTREMITIES: No cyanosis, clubbing, or edema, 2+ distal pulses.  A/P 25 yo here for IUD check - IUD appears to be in the appropriate location - Reminded the patient of expected abnormal bleeding for up to 6 months following placement - She may discontinue OCP since she did not have good results with it and see effect of IUD on DUB - RTC prn if persistent vaginal bleeding after 3 months

## 2021-04-22 ENCOUNTER — Ambulatory Visit (INDEPENDENT_AMBULATORY_CARE_PROVIDER_SITE_OTHER): Payer: BC Managed Care – PPO | Admitting: Obstetrics and Gynecology

## 2021-04-22 ENCOUNTER — Other Ambulatory Visit: Payer: Self-pay

## 2021-04-22 ENCOUNTER — Encounter: Payer: Self-pay | Admitting: Obstetrics and Gynecology

## 2021-04-22 ENCOUNTER — Other Ambulatory Visit (HOSPITAL_COMMUNITY)
Admission: RE | Admit: 2021-04-22 | Discharge: 2021-04-22 | Disposition: A | Discharge status: Home / Self Care | Payer: BC Managed Care – PPO | Source: Ambulatory Visit | Attending: Obstetrics and Gynecology | Admitting: Obstetrics and Gynecology

## 2021-04-22 VITALS — BP 139/90 | HR 76 | Ht 62.0 in | Wt 210.8 lb

## 2021-04-22 DIAGNOSIS — Z30431 Encounter for routine checking of intrauterine contraceptive device: Secondary | ICD-10-CM

## 2021-04-22 DIAGNOSIS — Z01419 Encounter for gynecological examination (general) (routine) without abnormal findings: Secondary | ICD-10-CM | POA: Diagnosis not present

## 2021-04-22 MED ORDER — NORETHINDRONE ACET-ETHINYL EST 1-20 MG-MCG PO TABS: 1.0000 | ORAL_TABLET | Freq: Every day | ORAL | 11 refills | Status: DC

## 2021-04-22 NOTE — Progress Notes (Signed)
Subjective:     Angel Pratt is a 30 y.o. female P3 with and is here for a comprehensive physical exam. The patient reports irregular vaginal bleeding over the past 3 months. She describes a 5 day heavy menses followed by a few days of spotting. Patient is sexually active using IUD for contraception and cycle control. Patient reports that menses were very light for the first 4 years following IUD insertion. Patient denies pelvic pain or abnormal discharge. Patient denies urinary incontinence. Patient is without any other complaints. Patient desires IUD removal  Past Medical History:  Diagnosis Date   Anemia    Kidney stones    Ovarian cyst    Preeclampsia    Pregnancy induced hypertension    Pyelonephritis complicating pregnancy 03/2013   Past Surgical History:  Procedure Laterality Date   DILATION AND CURETTAGE OF UTERUS     TONSILLECTOMY     2009   Family History  Problem Relation Age of Onset   Cancer Mother        cervial dysplasia   Ovarian cysts Mother    Cancer Maternal Aunt        cervical   Cancer Maternal Grandmother 40       Cervical and Breast one breast   Ovarian cysts Maternal Grandmother    Heart disease Maternal Grandmother    Diabetes Maternal Grandfather    Heart disease Maternal Grandfather     Social History   Socioeconomic History   Marital status: Married    Spouse name: Not on file   Number of children: Not on file   Years of education: Not on file   Highest education level: Not on file  Occupational History   Not on file  Tobacco Use   Smoking status: Never   Smokeless tobacco: Never  Substance and Sexual Activity   Alcohol use: No   Drug use: No   Sexual activity: Yes    Partners: Male    Birth control/protection: Condom, Surgical    Comment: husband had vasectomy  Other Topics Concern   Not on file  Social History Narrative   Not on file   Social Determinants of Health   Financial Resource Strain: Not on file  Food  Insecurity: Not on file  Transportation Needs: Not on file  Physical Activity: Not on file  Stress: Not on file  Social Connections: Not on file  Intimate Partner Violence: Not on file   Health Maintenance  Topic Date Due   Hepatitis C Screening  Never done   PAP SMEAR-Modifier  04/21/2018   INFLUENZA VACCINE  05/05/2021   TETANUS/TDAP  09/01/2025   HIV Screening  Completed   Pneumococcal Vaccine 73-53 Years old  Aged Out   HPV VACCINES  Aged Out       Review of Systems Pertinent items noted in HPI and remainder of comprehensive ROS otherwise negative.   Objective:  Blood pressure 139/90, pulse 76, height 5\' 2"  (1.575 m), weight 210 lb 12.8 oz (95.6 kg), currently breastfeeding.    GENERAL: Well-developed, well-nourished female in no acute distress.  HEENT: Normocephalic, atraumatic. Sclerae anicteric.  NECK: Supple. Normal thyroid.  LUNGS: Clear to auscultation bilaterally.  HEART: Regular rate and rhythm. BREASTS: Symmetric in size. No palpable masses or lymphadenopathy, skin changes, or nipple drainage. ABDOMEN: Soft, nontender, nondistended. No organomegaly. PELVIC: Normal external female genitalia. Vagina is pink and rugated.  Normal discharge. Normal appearing cervix. IUD strings not visualized. Uterus is normal in size.  No  adnexal mass or tenderness. EXTREMITIES: No cyanosis, clubbing, or edema, 2+ distal pulses.    Assessment:    Healthy female exam.      Plan:    Pap smear collected Patient declined STI screening Pelvic ultrasound ordered to confirm IUD location Patient desires to try birth control pills for cycle control- Rx Loestrin provided Patient will be contacted with results and further management. Discussed in office removal with or without hysteroscopy or OR removal See After Visit Summary for Counseling Recommendations

## 2021-04-22 NOTE — Patient Instructions (Signed)

## 2021-04-24 LAB — CYTOLOGY - PAP: Diagnosis: NEGATIVE

## 2021-05-15 ENCOUNTER — Ambulatory Visit
Admission: RE | Admit: 2021-05-15 | Discharge: 2021-05-15 | Disposition: A | Discharge status: Home / Self Care | Payer: BC Managed Care – PPO | Source: Ambulatory Visit | Attending: Obstetrics and Gynecology | Admitting: Obstetrics and Gynecology

## 2021-05-15 ENCOUNTER — Other Ambulatory Visit: Payer: Self-pay

## 2021-05-15 ENCOUNTER — Other Ambulatory Visit: Payer: Self-pay | Admitting: Obstetrics & Gynecology

## 2021-05-15 ENCOUNTER — Telehealth: Payer: Self-pay | Admitting: *Deleted

## 2021-05-15 DIAGNOSIS — Z30432 Encounter for removal of intrauterine contraceptive device: Secondary | ICD-10-CM

## 2021-05-15 DIAGNOSIS — Z30431 Encounter for routine checking of intrauterine contraceptive device: Secondary | ICD-10-CM | POA: Insufficient documentation

## 2021-05-15 DIAGNOSIS — R102 Pelvic and perineal pain: Secondary | ICD-10-CM | POA: Diagnosis not present

## 2021-05-15 DIAGNOSIS — Z975 Presence of (intrauterine) contraceptive device: Secondary | ICD-10-CM | POA: Diagnosis not present

## 2021-05-15 MED ORDER — IBUPROFEN 800 MG PO TABS: 800.0000 mg | ORAL_TABLET | Freq: Three times a day (TID) | ORAL | 0 refills | Status: DC | PRN

## 2021-05-15 MED ORDER — LORAZEPAM 0.5 MG PO TABS: 0.5000 mg | ORAL_TABLET | Freq: Four times a day (QID) | ORAL | 0 refills | Status: DC | PRN

## 2021-05-15 NOTE — Progress Notes (Signed)
Patient with malpositioned IUD, needs removal in office. She is very anxious about this and anticipates a lot of pain. Prescribed Ativan and Ibuprofen to be used pre-procedure, she will need someone to drive her home afterwards.  Procedure scheduled on 06/02/21, patient informed of pre-procedural instructions and recommendations.   [name], MD

## 2021-05-15 NOTE — Telephone Encounter (Signed)
Pt informed of [place] results, pt will come in office to try and remove it.

## 2021-05-15 NOTE — Telephone Encounter (Signed)
-----   Message from Tereso Newcomer, MD sent at 05/15/2021 11:32 AM EDT ----- IUD is malpositioned and in the cervix.  She can come in for removal trial in office. Please call to inform patient of results and recommendations.

## 2021-06-02 ENCOUNTER — Other Ambulatory Visit: Payer: Self-pay

## 2021-06-02 ENCOUNTER — Encounter: Payer: Self-pay | Admitting: Obstetrics & Gynecology

## 2021-06-02 ENCOUNTER — Ambulatory Visit (INDEPENDENT_AMBULATORY_CARE_PROVIDER_SITE_OTHER): Payer: BC Managed Care – PPO | Admitting: Obstetrics & Gynecology

## 2021-06-02 DIAGNOSIS — Z30432 Encounter for removal of intrauterine contraceptive device: Secondary | ICD-10-CM

## 2021-06-02 NOTE — Progress Notes (Signed)
    GYNECOLOGY OFFICE PROCEDURE NOTE  Angel Pratt is a 30 y.o. [id number] here for Liletta IUD removal due to malpositioning. No GYN concerns.  Currently on OCPs, being used more for menstrual control.  Last pap smear was on 04/22/2021 and was normal.  US PELVIC COMPLETE WITH TRANSVAGINAL  Result Date: 05/15/2021 CLINICAL DATA:  Missing IUD strings, unknown LMP, has an IUD for 5 years, started having pain recently and wanted 8 removed by strings could not be located EXAM: TRANSABDOMINAL AND TRANSVAGINAL ULTRASOUND OF PELVIS TECHNIQUE: Both transabdominal and transvaginal ultrasound examinations of the pelvis were performed. Transabdominal technique was performed for global imaging of the pelvis including uterus, ovaries, adnexal regions, and pelvic cul-de-sac. It was necessary to proceed with endovaginal exam following the transabdominal exam to visualize the ovaries, endometrium and IUD. COMPARISON:  01/02/2014 FINDINGS: Uterus Measurements: 8.0 x 5.2 x 6.6 cm = volume: 142 mL. Anteverted. Normal morphology without mass Endometrium Thickness: 7 mm. IUD identified within the cervix extending to the distal aspect of the lower uterine segment. The arms of the IUD likely extend into the wall. No endometrial fluid or mass Right ovary Measurements: 3.9 x 2.0 x 2.9 cm = volume: 12.0 mL. Normal morphology without mass Left ovary Measurements: 3.5 x 2.3 x 3.1 cm = volume: 13.0 mL. Normal morphology without mass Other findings Trace free pelvic fluid.  No adnexal masses. IMPRESSION: IUD identified within the cervix, with arms likely extending into the wall. Remainder of exam unremarkable. Electronically Signed   By: Ulyses Southward M.D.   On: 05/15/2021 09:48    IUD Removal  Patient identified, informed consent performed, consent signed.  Patient was in the dorsal lithotomy position, normal external genitalia was noted.  A speculum was placed in the patient's vagina, normal discharge was noted, no lesions. The cervix  was visualized,  no lesions, no abnormal discharge, no strings noted in os. However, the distal part of the IUD was noted in the surface of cervix on the left side about 1 cm from the os.  Physical Exam Genitourinary:     Blue dot denotes position of distal part of the IUD. This was grasped and pulled using ring forceps. The Liletta IUD was removed in its entirety, the strings were noted to be wrapped around the upper part of the IUD.  Patient tolerated the procedure well.    Patient will continue to use OCPs for menstrual control.  Routine preventative health maintenance measures emphasized.   [name], MD, FACOG Obstetrician & Gynecologist, [facility] for [employer], [facility]

## 2021-06-20 DIAGNOSIS — Z111 Encounter for screening for respiratory tuberculosis: Secondary | ICD-10-CM | POA: Diagnosis not present

## 2021-06-23 DIAGNOSIS — Z111 Encounter for screening for respiratory tuberculosis: Secondary | ICD-10-CM | POA: Diagnosis not present

## 2022-09-13 ENCOUNTER — Other Ambulatory Visit: Payer: Self-pay | Admitting: Obstetrics and Gynecology

## 2022-09-14 ENCOUNTER — Other Ambulatory Visit: Payer: Self-pay | Admitting: *Deleted

## 2022-09-14 MED ORDER — NORETHINDRONE ACET-ETHINYL EST 1-20 MG-MCG PO TABS: 1.0000 | ORAL_TABLET | Freq: Every day | ORAL | 5 refills | Status: DC

## 2022-10-02 ENCOUNTER — Other Ambulatory Visit: Payer: Self-pay | Admitting: Nurse Practitioner

## 2022-10-02 DIAGNOSIS — N3000 Acute cystitis without hematuria: Secondary | ICD-10-CM

## 2022-10-02 MED ORDER — CEPHALEXIN 500 MG PO CAPS: 500.0000 mg | ORAL_CAPSULE | Freq: Two times a day (BID) | ORAL | 0 refills | Status: AC

## 2022-10-09 ENCOUNTER — Other Ambulatory Visit: Payer: Self-pay | Admitting: Nurse Practitioner

## 2022-10-09 DIAGNOSIS — N3000 Acute cystitis without hematuria: Secondary | ICD-10-CM

## 2022-10-09 MED ORDER — CIPROFLOXACIN HCL 500 MG PO TABS: 500.0000 mg | ORAL_TABLET | Freq: Two times a day (BID) | ORAL | 0 refills | Status: AC

## 2023-03-24 ENCOUNTER — Other Ambulatory Visit: Payer: Self-pay | Admitting: *Deleted

## 2023-03-24 MED ORDER — NORETHINDRONE ACET-ETHINYL EST 1-20 MG-MCG PO TABS: 1.0000 | ORAL_TABLET | Freq: Every day | ORAL | 1 refills | Status: DC

## 2023-03-30 ENCOUNTER — Other Ambulatory Visit: Payer: Self-pay | Admitting: *Deleted

## 2023-03-30 ENCOUNTER — Encounter: Payer: Self-pay | Admitting: Obstetrics & Gynecology

## 2023-03-30 MED ORDER — NORGESTIMATE-ETH ESTRADIOL 0.25-35 MG-MCG PO TABS: 1.0000 | ORAL_TABLET | Freq: Every day | ORAL | 3 refills | Status: DC

## 2023-05-12 ENCOUNTER — Ambulatory Visit (INDEPENDENT_AMBULATORY_CARE_PROVIDER_SITE_OTHER): Payer: Self-pay | Admitting: Family Medicine

## 2023-05-12 ENCOUNTER — Encounter: Payer: Self-pay | Admitting: Family Medicine

## 2023-05-12 VITALS — BP 150/107 | HR 93 | Ht 62.0 in | Wt 212.2 lb

## 2023-05-12 DIAGNOSIS — N921 Excessive and frequent menstruation with irregular cycle: Secondary | ICD-10-CM

## 2023-05-12 DIAGNOSIS — Z124 Encounter for screening for malignant neoplasm of cervix: Secondary | ICD-10-CM

## 2023-05-12 DIAGNOSIS — Z01419 Encounter for gynecological examination (general) (routine) without abnormal findings: Secondary | ICD-10-CM

## 2023-05-12 NOTE — Patient Instructions (Signed)

## 2023-05-12 NOTE — Assessment & Plan Note (Signed)
Discussed options for treatments, as she has failed COCs and IUD. Normal U/s from 2022. She likely has adenomyosis. Discussed endometrial ablation vs. Hysterectomy as options. Will return with insurance for further discussion.

## 2023-05-12 NOTE — Progress Notes (Signed)
Subjective:     Angel Pratt is a 32 y.o. female and is here for a comprehensive physical exam. The patient reports problems - has irregular cycles.  Had been taking DIM supplements and that had given her regular cycles since 05/2022. She had normal cycles initially, but then in May she noted heavier bleeding. Has been bleeding since middle of June..Had been taking DIM supplements and that had given her regular cycles since 05/2022. Initially placed on Loestrin and bled all the time. She then switched to Sprintec. She is filling up 1-2 pads/day. Reports severe cramps  The following portions of the patient's history were reviewed and updated as appropriate: allergies, current medications, past family history, past medical history, past social history, past surgical history, and problem list.  Review of Systems Pertinent items noted in HPI and remainder of comprehensive ROS otherwise negative.   Objective:    BP (!) 150/107   Pulse 93   Ht 5\' 2"  (1.575 m)   Wt 212 lb 3.2 oz (96.3 kg)   BMI 38.81 kg/m  General appearance: alert, cooperative, and appears stated age Head: Normocephalic, without obvious abnormality, atraumatic Neck: no adenopathy, supple, symmetrical, trachea midline, and thyroid not enlarged, symmetric, no tenderness/mass/nodules Lungs: clear to auscultation bilaterally Breasts: normal appearance, no masses or tenderness Heart: regular rate and rhythm, S1, S2 normal, no murmur, click, rub or gallop Abdomen: soft, non-tender; bowel sounds normal; no masses,  no organomegaly Pelvic: cervix normal in appearance, external genitalia normal, no adnexal masses or tenderness, no cervical motion tenderness, uterus normal size, shape, and consistency, and vagina normal without discharge Extremities: extremities normal, atraumatic, no cyanosis or edema Pulses: 2+ and symmetric Skin: Skin color, texture, turgor normal. No rashes or lesions Lymph nodes: Cervical, supraclavicular, and  axillary nodes normal. Neurologic: Grossly normal    Assessment:    Healthy female exam.      Plan:   Problem List Items Addressed This Visit       Unprioritized   Menometrorrhagia    Discussed options for treatments, as she has failed COCs and IUD. Normal U/s from 2022. She likely has adenomyosis. Discussed endometrial ablation vs. Hysterectomy as options. Will return with insurance for further discussion.      Other Visit Diagnoses     Screening for malignant neoplasm of cervix    -  Primary   Encounter for gynecological examination without abnormal finding             See After Visit Summary for Counseling Recommendations

## 2023-05-12 NOTE — Progress Notes (Signed)
Patient presents for Annual.  LMP: 03/21/23 Last pap: Date: 2022 Contraception: OCP Mammogram: Not yet indicated STD Screening: Declines Flu Vaccine : N/A  CC:  Annual       CC: Pt stating that she has not stopped bleeding since fathers day, has happened before   On Ortho-cyclen but stating that she has been bleeding on this also

## 2023-08-02 ENCOUNTER — Telehealth: Payer: Self-pay

## 2023-08-02 NOTE — Telephone Encounter (Signed)
Called pt to schedule surgery consult w/ Dr. Shawnie Pons

## 2023-08-30 ENCOUNTER — Ambulatory Visit: Payer: Self-pay | Admitting: Obstetrics and Gynecology

## 2023-09-13 ENCOUNTER — Ambulatory Visit (INDEPENDENT_AMBULATORY_CARE_PROVIDER_SITE_OTHER): Payer: Self-pay | Admitting: Obstetrics & Gynecology

## 2023-09-13 ENCOUNTER — Other Ambulatory Visit (HOSPITAL_COMMUNITY)
Admission: RE | Admit: 2023-09-13 | Discharge: 2023-09-13 | Disposition: A | Discharge status: Home / Self Care | Payer: Self-pay | Source: Ambulatory Visit | Attending: Obstetrics & Gynecology | Admitting: Obstetrics & Gynecology

## 2023-09-13 ENCOUNTER — Encounter: Payer: Self-pay | Admitting: Obstetrics & Gynecology

## 2023-09-13 VITALS — BP 154/107 | HR 97

## 2023-09-13 DIAGNOSIS — N921 Excessive and frequent menstruation with irregular cycle: Secondary | ICD-10-CM | POA: Insufficient documentation

## 2023-09-13 DIAGNOSIS — Z3202 Encounter for pregnancy test, result negative: Secondary | ICD-10-CM

## 2023-09-13 LAB — CBC
Hematocrit: 40.3 % (ref 34.0–46.6)
Hemoglobin: 12.9 g/dL (ref 11.1–15.9)
MCH: 25 pg — ABNORMAL LOW (ref 26.6–33.0)
MCHC: 32 g/dL (ref 31.5–35.7)
MCV: 78 fL — ABNORMAL LOW (ref 79–97)
Platelets: 483 10*3/uL — ABNORMAL HIGH (ref 150–450)
RBC: 5.17 x10E6/uL (ref 3.77–5.28)
RDW: 14.5 % (ref 11.7–15.4)
WBC: 13.1 10*3/uL — ABNORMAL HIGH (ref 3.4–10.8)

## 2023-09-13 LAB — POCT URINE PREGNANCY: Preg Test, Ur: NEGATIVE

## 2023-09-13 NOTE — Progress Notes (Signed)
GYNECOLOGY OFFICE VISIT NOTE  History:   Angel Pratt is a 32 y.o. [id number] here today for discussion of surgery.  She has a history of menometrorrhagia for several months, not helped by hormonal medication. During her last visit in 05/2023, she discussed surgical intervention.  Patient wants definitive management with hysterectomy.  History of three term SVDs. Still has ongoing light bleeding, has been having bleeding for months.  Has not had endometrial biopsy yet, willing to do this today. She denies any abnormal vaginal discharge, pelvic pain or other concerns.    Past Medical History:  Diagnosis Date   Anemia    Kidney stones    Ovarian cyst    Preeclampsia    Pregnancy induced hypertension    Pyelonephritis complicating pregnancy 03/2013    Past Surgical History:  Procedure Laterality Date   DILATION AND CURETTAGE OF UTERUS     TONSILLECTOMY     2009    The following portions of the patient's history were reviewed and updated as appropriate: allergies, current medications, past family history, past medical history, past social history, past surgical history and problem list.   Health Maintenance:  Normal pap and negative HRHPV on 04/22/2021.   Review of Systems:  Pertinent items noted in HPI and remainder of comprehensive ROS otherwise negative.  Physical Exam:  BP (!) 154/107   Pulse 97  CONSTITUTIONAL: Well-developed, well-nourished female in no acute distress.  HEENT:  Normocephalic, atraumatic. External right and left ear normal. No scleral icterus.  NECK: Normal range of motion, supple, no masses noted on observation SKIN: No rash noted. Not diaphoretic. No erythema. No pallor. MUSCULOSKELETAL: Normal range of motion. No edema noted. NEUROLOGIC: Alert and oriented to person, place, and time. Normal muscle tone coordination. No cranial nerve deficit noted. PSYCHIATRIC: Normal mood and affect. Normal behavior. Normal judgment and thought content. CARDIOVASCULAR:  Normal heart rate noted RESPIRATORY: Effort and breath sounds normal, no problems with respiration noted ABDOMEN: No masses noted. No other overt distention noted.   PELVIC: Normal appearing external genitalia; normal urethral meatus; normal appearing vaginal mucosa and cervix.  Bloody discharge noted, pap obtained.  Normal uterine size, no other palpable masses, no uterine or adnexal tenderness. Performed in the presence of a chaperone  ENDOMETRIAL BIOPSY     The indications for endometrial biopsy were reviewed.   Risks of the biopsy including cramping, bleeding, infection, uterine perforation, inadequate specimen and need for additional procedures  were discussed. The patient states she understands and agrees to undergo procedure today. Consent was signed. Time out was performed. Urine HCG was negative. Chaperone was present during entire procedure. During the pelvic exam, the cervix was prepped with Betadine. A single-toothed tenaculum was placed on the anterior lip of the cervix to stabilize it. The 3 mm pipelle was introduced into the endometrial cavity without difficulty to a depth of 9 cm, and a moderate amount of tissue was obtained and sent to pathology. The instruments were removed from the patient's vagina. Minimal bleeding from the cervix was noted. The patient tolerated the procedure well. Routine post-procedure instructions were given to the patient.       Assessment and Plan:     1. Menometrorrhagia Endometrial biopsy and pap done today, will follow up results and manage accordingly. CBC also checked given long duration of bleeding. Ultrasound ordered, was normal in 2022, wanted to evaluate for any interval changes. - Surgical pathology - Cytology - PAP - US PELVIC COMPLETE WITH TRANSVAGINAL; Future - CBC -  Ambulatory Referral For Surgery Scheduling Patient desires definitive management with hysterectomy.  I proposed doing a total vaginal hysterectomy (TVH) and prophylactic  bilateral salpingectomy.  No  indication for oophorectomy.  Patient agrees with this proposed surgery.  The risks of surgery were discussed in detail with the patient including but not limited to: bleeding which may require transfusion or reoperation; infection which may require antibiotics; injury to bowel, bladder, ureters or other surrounding organs; need for additional procedures including laparotomy or subsequent procedures secondary to abnormal pathology; formation of adhesions; thromboembolic phenomenon; incisional problems and other postoperative/anesthesia complications.  Patient was also advised that she will remain in house for 1 night; and expected recovery time after a hysterectomy is 6-8 weeks.  Patient was told that the likelihood that her condition and symptoms will be treated effectively with this surgical management was very high; the postoperative expectations were also discussed in detail. The patient also understands the alternative treatment options which were discussed in full. All questions were answered.  She was told that she will be contacted by our surgical scheduler regarding the time and date of her surgery; routine preoperative instructions will be given to her by the preoperative nursing team.    Routine postoperative instructions will be reviewed with the patient in detail after surgery.  Printed patient education handouts about the procedure was given to the patient to review at home.  Routine preventative health maintenance measures emphasized. Please refer to After Visit Summary for other counseling recommendations.   Return for any gynecologic concerns.    I spent 30 minutes dedicated to the care of this patient including pre-visit review of records, face to face time with the patient discussing her conditions and treatments and post visit orders.    [name], MD, FACOG Obstetrician & Gynecologist, [facility] for [employer], [facility]

## 2023-09-13 NOTE — Patient Instructions (Signed)
ENDOMETRIAL BIOPSY POST-PROCEDURE INSTRUCTIONS  You may take Ibuprofen, Aleve or Tylenol for pain if needed.  Cramping should resolve within in 24 hours.  You may have a small amount of spotting.  You should wear a mini pad for the next few days.  You may have intercourse after 24 hours.  You need to call if you have any pelvic pain, fever, heavy bleeding or foul smelling vaginal discharge.  Shower or bathe as normal  6. We will call you within one week with results or we will discuss   the results at your follow-up appointment if needed.   

## 2023-09-14 ENCOUNTER — Encounter: Payer: Self-pay | Admitting: Obstetrics & Gynecology

## 2023-09-16 ENCOUNTER — Ambulatory Visit
Admission: RE | Admit: 2023-09-16 | Discharge: 2023-09-16 | Disposition: A | Discharge status: Home / Self Care | Payer: Self-pay | Source: Ambulatory Visit | Attending: Obstetrics & Gynecology | Admitting: Obstetrics & Gynecology

## 2023-09-16 DIAGNOSIS — N921 Excessive and frequent menstruation with irregular cycle: Secondary | ICD-10-CM | POA: Insufficient documentation

## 2023-09-16 LAB — SURGICAL PATHOLOGY

## 2023-09-17 LAB — CYTOLOGY - PAP
Chlamydia: NEGATIVE
Comment: NEGATIVE
Comment: NEGATIVE
Comment: NEGATIVE
Comment: NORMAL
Diagnosis: NEGATIVE
High risk HPV: NEGATIVE
Neisseria Gonorrhea: NEGATIVE
Trichomonas: NEGATIVE

## 2023-09-20 ENCOUNTER — Encounter: Payer: Self-pay | Admitting: Obstetrics & Gynecology

## 2023-10-07 ENCOUNTER — Telehealth: Payer: Self-pay

## 2023-10-07 NOTE — Telephone Encounter (Signed)
 Patient confirmed she would like to have her surgery scheduled w/ Dr. Macon Large on 11/17/23 @[facility]  at 11 am. I provided Pre-op instructions over the phone and patient is aware the written details will be sent to her Mychart.

## 2023-11-10 ENCOUNTER — Ambulatory Visit (INDEPENDENT_AMBULATORY_CARE_PROVIDER_SITE_OTHER): Payer: Self-pay

## 2023-11-10 DIAGNOSIS — N390 Urinary tract infection, site not specified: Secondary | ICD-10-CM

## 2023-11-10 HISTORY — DX: Urinary tract infection, site not specified: N39.0

## 2023-11-10 LAB — POCT URINALYSIS DIPSTICK

## 2023-11-10 MED ORDER — NITROFURANTOIN MONOHYD MACRO 100 MG PO CAPS: 100.0000 mg | ORAL_CAPSULE | Freq: Two times a day (BID) | ORAL | 0 refills | Status: DC

## 2023-11-10 NOTE — Progress Notes (Signed)
 SUBJECTIVE:  33 y.o. female complains of  urinary frequency Denies abnormal vaginal bleeding or significant pelvic pain or fever.Denies history of known exposure to STD.  No LMP recorded.  OBJECTIVE:  She appears alert, well appearing, in no apparent distress Urine dipstick: positive for leukocytes.  ASSESSMENT:  Dysuria    PLAN:  urine culture sent to lab. Treatment: To be determined once lab results are received ROV prn if symptoms persist or worsen.

## 2023-11-11 ENCOUNTER — Encounter (HOSPITAL_BASED_OUTPATIENT_CLINIC_OR_DEPARTMENT_OTHER): Payer: Self-pay | Admitting: Obstetrics & Gynecology

## 2023-11-11 NOTE — Progress Notes (Signed)
 Spoke w/ via phone for pre-op interview--- pt Lab needs dos---- urine preg        Lab results------ lab appt 11-15-2023 getting CBC/ T&S COVID test -----patient states asymptomatic no test needed Arrive at ------- 0930 on 11-17-2023 NPO after MN , sips water w/ meds Med rec completed Medications to take morning of surgery ----- claritin  Diabetic medication ----- n/a Patient instructed no nail polish to be worn day of surgery Patient instructed to bring photo id and insurance card day of surgery Patient aware to have Driver (ride ) / caregiver    for 24 hours after surgery - husband, steven Patient Special Instructions ----- will pick bag w/ hibicleans and written instrutions at lab appt Asked to call w/ any questions Pre-Op special Instructions ----- n/a Patient verbalized understanding of instructions that were given at this phone interview. Patient denies chest pain, sob, fever, cough at the interview.

## 2023-11-11 NOTE — Progress Notes (Signed)
 Your procedure is scheduled on  :  Wednesday,  11-17-2023  Report to [facility]  AT  _9:30__ AM.   Call this number if you have problems the morning of surgery:  [phone] Any questions prior to surgery call pre-op nurse,  Angel Pratt :  [phone]   OUR ADDRESS IS [address].  WE ARE LOCATED IN [facility]  PLEASE BRING YOUR INSURANCE CARD AND PHOTO ID DAY OF SURGERY.                                     REMEMBER: Do not eat any food and do not drink any liquid after midnight night before surgery.  This includes no water,  candy,  gum,  and  mints.   Please brush your teeth morning of surgery and rinse mouth out. _____________________________________________________________________     TAKE ONLY THESE MEDICATIONS MORNING OF SURGERY: May take with sips of water Claritin-D                                       DO NOT WEAR JEWERLY/  METAL/  PIERCINGS (INCLUDING NO PLASTIC PIERCINGS) DO NOT WEAR LOTIONS, POWDERS, PERFUMES OR NAIL POLISH ON YOUR FINGERNAILS. TOENAIL POLISH IS OK TO WEAR. DO NOT SHAVE FOR 48 HOURS PRIOR TO DAY OF SURGERY.  CONTACTS, GLASSES, OR DENTURES MAY NOT BE WORN TO SURGERY.  REMEMBER: NO SMOKING, VAPING ,  DRUGS OR ALCOHOL FOR 24 HOURS BEFORE YOUR SURGERY.                                    [name] IS NOT RESPONSIBLE  FOR ANY BELONGINGS.                                                                    Angel Pratt           [name] - Preparing for Surgery Before surgery, you can play an important role.  Because skin is not sterile, your skin needs to be as free of germs as possible.  You can reduce the number of germs on your skin by washing with CHG (chlorahexidine gluconate) soap before surgery.  CHG is an antiseptic cleaner which kills germs and bonds with the skin to continue killing germs even after washing. Please DO NOT use if you have an allergy to CHG or antibacterial soaps.  If your skin becomes  reddened/irritated stop using the CHG and inform your nurse when you arrive at Short Stay. Do not shave (including legs and underarms) for at least 48 hours prior to the first CHG shower.  You may shave your face/neck. Please follow these instructions carefully:  1.  Shower with CHG Soap the night before surgery and the  morning of Surgery.  2.  If you choose to wash your hair, wash your hair first as usual with your  normal  shampoo.  3.  After you shampoo, rinse your hair and body thoroughly to remove  the  shampoo.                                        4.  Use CHG as you would any other liquid soap.  You can apply chg directly  to the skin and wash , chg soap provided, night before and morning of your surgery.  5.  Apply the CHG Soap to your body ONLY FROM THE NECK DOWN.   Do not use on face/ open                           Wound or open sores. Avoid contact with eyes, ears mouth and genitals (private parts).                       Wash face,  Genitals (private parts) with your normal soap.             6.  Wash thoroughly, paying special attention to the area where your surgery  will be performed.  7.  Thoroughly rinse your body with warm water from the neck down.  8.  DO NOT shower/wash with your normal soap after using and rinsing off  the CHG Soap.             9.  Pat yourself dry with a clean towel.            10.  Wear clean pajamas.            11.  Place clean sheets on your bed the night of your first shower and do not  sleep with pets. Day of Surgery : Do not apply any lotions/ powders the morning of surgery.  Please wear clean clothes to the hospital/surgery center.  IF YOU HAVE ANY SKIN IRRITATION OR PROBLEMS WITH THE SURGICAL SOAP, PLEASE GET A BAR OF GOLD DIAL SOAP AND SHOWER THE NIGHT BEFORE YOUR SURGERY AND THE MORNING OF YOUR SURGERY. PLEASE LET THE NURSE KNOW MORNING OF YOUR SURGERY IF YOU HAD ANY PROBLEMS WITH THE SURGICAL SOAP.   YOUR SURGEON MAY HAVE REQUESTED EXTENDED  RECOVERY TIME AFTER YOUR SURGERY. IT COULD BE A  JUST A FEW HOURS  UP TO AN OVERNIGHT STAY.  YOUR SURGEON SHOULD HAVE DISCUSSED THIS WITH YOU PRIOR TO YOUR SURGERY. IN THE EVENT YOU NEED TO STAY OVERNIGHT PLEASE REFER TO THE FOLLOWING GUIDELINES. YOU MAY HAVE UP TO 4 VISITORS  MAY VISIT IN THE EXTENDED RECOVERY ROOM UNTIL 800 PM ONLY.  ONE  VISITOR AGE 29 AND OVER MAY SPEND THE NIGHT AND MUST BE IN EXTENDED RECOVERY ROOM NO LATER THAN 800 PM . YOUR DISCHARGE TIME AFTER YOU SPEND THE NIGHT IS 900 AM THE MORNING AFTER YOUR SURGERY. YOU MAY PACK A SMALL OVERNIGHT BAG WITH TOILETRIES FOR YOUR OVERNIGHT STAY IF YOU WISH.  REGARDLESS OF IF YOU STAY OVER NIGHT OR ARE DISCHARGED THE SAME DAY YOU WILL BE REQUIRED TO HAVE A RESPONSIBLE ADULT (18 YRS OLD OR OLDER) STAY WITH YOU FOR AT LEAST THE FIRST 78 HOURS WHEN HOME.  YOUR PRESCRIPTION MEDICATIONS WILL BE PROVIDED DURING [facility] STAY.  ________________________________________________________________________

## 2023-11-12 ENCOUNTER — Encounter: Payer: Self-pay | Admitting: Obstetrics & Gynecology

## 2023-11-12 LAB — URINE CULTURE

## 2023-11-15 ENCOUNTER — Encounter: Payer: Self-pay | Admitting: Obstetrics & Gynecology

## 2023-11-15 ENCOUNTER — Encounter (HOSPITAL_COMMUNITY)
Admission: RE | Admit: 2023-11-15 | Discharge: 2023-11-15 | Discharge status: Home / Self Care | Payer: Self-pay | Source: Ambulatory Visit | Attending: Obstetrics & Gynecology

## 2023-11-15 DIAGNOSIS — Z01812 Encounter for preprocedural laboratory examination: Secondary | ICD-10-CM | POA: Insufficient documentation

## 2023-11-15 DIAGNOSIS — N921 Excessive and frequent menstruation with irregular cycle: Secondary | ICD-10-CM | POA: Insufficient documentation

## 2023-11-15 LAB — CBC
HCT: 43.8 % (ref 36.0–46.0)
Hemoglobin: 13.3 g/dL (ref 12.0–15.0)
MCH: 23.7 pg — ABNORMAL LOW (ref 26.0–34.0)
MCHC: 30.4 g/dL (ref 30.0–36.0)
MCV: 78.1 fL — ABNORMAL LOW (ref 80.0–100.0)
Platelets: 385 10*3/uL (ref 150–400)
RBC: 5.61 MIL/uL — ABNORMAL HIGH (ref 3.87–5.11)
RDW: 14.4 % (ref 11.5–15.5)
WBC: 12.6 10*3/uL — ABNORMAL HIGH (ref 4.0–10.5)
nRBC: 0 % (ref 0.0–0.2)

## 2023-11-16 NOTE — Progress Notes (Signed)
Spoke with Angel Pratt to confirm arrival time change to 0630 11/17/2023. No solid food after midnight to include candy,gum, and mints. Clear liquids until 0530, no milk products.

## 2023-11-16 NOTE — H&P (Signed)
Preoperative History and Physical  Angel Pratt is a 33 y.o. [id number] here for surgical management of menometrorrhagia.   No significant preoperative concerns.  Proposed surgery: Total vaginal hysterectomy (TVH) and bilateral salpingectomy(BS).   Past Medical History:  Diagnosis Date   Abnormal uterine bleeding (AUB)    Anemia    Constipation    11-11-2023  pt stated caused by taking iron had stopped taking iron by adviced of GYN,  now eating alot of fiber   History of kidney stones    History of pre-eclampsia    History of pregnancy induced hypertension    History of pyelonephritis during pregnancy 03/05/2013   Menometrorrhagia    UTI (urinary tract infection) 11/10/2023   pt stated currently taking antibiotic   Past Surgical History:  Procedure Laterality Date   DILATION AND CURETTAGE OF UTERUS     x3  last one 2010   for miscarriage   TONSILLECTOMY AND ADENOIDECTOMY  2010   WISDOM TOOTH EXTRACTION     OB History  Gravida Para Term Preterm AB Living  6 3 3  3 3   SAB IAB Ectopic Multiple Live Births  3   0 3    # Outcome Date GA Lbr Len/2nd Weight Sex Type Anes PTL Lv  6 Term 11/13/15 [redacted]w[redacted]d 00:25 / 00:38 3395 g M Vag-Spont None  LIV  5 Term 06/07/13 [redacted]w[redacted]d 07:33 / 00:31 3075 g F Vag-Spont EPI N LIV  4 Term 09/19/10 [redacted]w[redacted]d  3147 g M Vag-Spont EPI N LIV     Birth Comments: PIH  3 SAB 2010          2 SAB 2010          1 SAB 2009          Patient denies any other pertinent gynecologic issues.   No current facility-administered medications on file prior to encounter.   Current Outpatient Medications on File Prior to Encounter  Medication Sig Dispense Refill   Ascorbic Acid (VITAMIN C) 500 MG CAPS Take 1 capsule by mouth daily.     loratadine-pseudoephedrine (CLARITIN-D 24-HOUR) 10-240 MG 24 hr tablet Take 1 tablet by mouth daily.     Probiotic Product (PROBIOTIC DAILY PO) Take by mouth daily. Per pt 2 chews daily     norgestimate-ethinyl estradiol (ORTHO-CYCLEN)  0.25-35 MG-MCG tablet Take 1 tablet by mouth daily. (Patient not taking: Reported on 11/11/2023) 84 tablet 3   Allergies  Allergen Reactions   Latex Hives, Shortness Of Breath and Swelling   Codeine Hives    Pt says she can take vicodin    Social History:   reports that she has never smoked. She has never used smokeless tobacco. She reports that she does not drink alcohol and does not use drugs.  Family History  Problem Relation Age of Onset   Cancer Mother        cervial dysplasia   Ovarian cysts Mother    Cancer Maternal Aunt        cervical   Cancer Maternal Grandmother 40       Cervical and Breast one breast   Ovarian cysts Maternal Grandmother    Heart disease Maternal Grandmother    Diabetes Maternal Grandfather    Heart disease Maternal Grandfather     Review of Systems: Pertinent items noted in HPI and remainder of comprehensive ROS otherwise negative.  PHYSICAL EXAM: Height 5\' 2"  (1.575 m), weight 90.7 kg, not currently breastfeeding. CONSTITUTIONAL: Well-developed, well-nourished female in no  acute distress.  HENT:  Normocephalic, atraumatic, External right and left ear normal. Oropharynx is clear and moist EYES: Conjunctivae and EOM are normal. Pupils are equal, round, and reactive to light. No scleral icterus.  NECK: Normal range of motion, supple, no masses SKIN: Skin is warm and dry. No rash noted. Not diaphoretic. No erythema. No pallor. NEUROLOGIC: Alert and oriented to person, place, and time. Normal reflexes, muscle tone coordination. No cranial nerve deficit noted. PSYCHIATRIC: Normal mood and affect. Normal behavior. Normal judgment and thought content. CARDIOVASCULAR: Normal heart rate noted, regular rhythm RESPIRATORY: Effort and breath sounds normal, no problems with respiration noted ABDOMEN: Soft, nontender, nondistended. PELVIC: Deferred MUSCULOSKELETAL: Normal range of motion. No edema and no tenderness. 2+ distal pulses.  Labs: Results for orders  placed or performed during the hospital encounter of 11/15/23 (from the past 2 weeks)  CBC   Collection Time: 11/15/23  1:16 PM  Result Value Ref Range   WBC 12.6 (H) 4.0 - 10.5 K/uL   RBC 5.61 (H) 3.87 - 5.11 MIL/uL   Hemoglobin 13.3 12.0 - 15.0 g/dL   HCT 16.1 09.6 - 04.5 %   MCV 78.1 (L) 80.0 - 100.0 fL   MCH 23.7 (L) 26.0 - 34.0 pg   MCHC 30.4 30.0 - 36.0 g/dL   RDW 40.9 81.1 - 91.4 %   Platelets 385 150 - 400 K/uL   nRBC 0.0 0.0 - 0.2 %  Type and screen   Collection Time: 11/15/23  1:16 PM  Result Value Ref Range   ABO/RH(D) O NEG    Antibody Screen NEG    Sample Expiration 11/29/2023,2359    Extend sample reason      NO TRANSFUSIONS OR PREGNANCY IN THE PAST 3 MONTHS Performed at [facility], [address], [place], [place] [address]   Results for orders placed or performed in visit on 11/10/23 (from the past 2 weeks)  Urine Culture   Collection Time: 11/10/23  8:00 AM   Specimen: Urine, Clean Catch   UR  Result Value Ref Range   Urine Culture, Routine Final report    Organism ID, Bacteria Comment   POCT Urinalysis Dipstick   Collection Time: 11/10/23  8:29 AM  Result Value Ref Range   Color, UA     Clarity, UA     Glucose, UA     Bilirubin, UA     Ketones, UA     Spec Grav, UA     Blood, UA     pH, UA     Protein, UA     Urobilinogen, UA     Nitrite, UA     Leukocytes, UA Small (1+) (A) Negative   Appearance     Odor     09/13/2023 Endometrial Biopsy ENDOMETRIUM, CURETTAGE:  Benign endometrium with inactive glands and stromal pseudodecidualization consistent with exogenous progestin effect.  Negative for hyperplasia, atypia or malignancy.   Imaging Studies: US PELVIC COMPLETE WITH TRANSVAGINAL Result Date: 09/20/2023 CLINICAL DATA:  Initial evaluation for abnormal uterine bleeding. EXAM: TRANSABDOMINAL AND TRANSVAGINAL ULTRASOUND OF PELVIS TECHNIQUE: Both transabdominal and transvaginal ultrasound examinations of the pelvis were  performed. Transabdominal technique was performed for global imaging of the pelvis including uterus, ovaries, adnexal regions, and pelvic cul-de-sac. It was necessary to proceed with endovaginal exam following the transabdominal exam to visualize the endometrium. COMPARISON:  Prior ultrasound from 05/15/2021 FINDINGS: Uterus Measurements: 9.2 x 4.8 x 4.8 cm = volume: 111.2 mL. Uterus is anteverted. No discrete fibroids.  Poor definition of the endometrial-myometrial interface, raising the question for adenomyosis. Endometrium Thickness: 13.1 mm.  No focal abnormality visualized. Right ovary Measurements: 4.4 x 2.5 x 2.9 cm = volume: 16.4 mL. Normal appearance/no adnexal mass. Left ovary Measurements: 3.1 x 2.4 x 2.3 cm = volume: 8.9 mL. Normal appearance/no adnexal mass. Other findings Trace free fluid noted within the pelvis. IMPRESSION: 1. Endometrial stripe measures 13.1 mm in thickness. If bleeding remains unresponsive to hormonal or medical therapy, sonohysterogram should be considered for focal lesion work-up. (Ref: Radiological Reasoning: Algorithmic Workup of Abnormal Vaginal Bleeding with Endovaginal Sonography and Sonohysterography. AJR 2008; 409:W11-91) 2. Poor definition of the endometrial-myometrial interface, raising the question for adenomyosis. No discrete fibroids. 3. Normal sonographic appearance of the ovaries.  No adnexal mass. 4. Trace free fluid within the pelvis, nonspecific, but most commonly physiologic. Electronically Signed   By: Rise Mu M.D.   On: 09/20/2023 04:00   Assessment: Principal Problem:   Menometrorrhagia   Plan: Patient will undergo surgical management with Total vaginal hysterectomy (TVH) and bilateral salpingectomy(BS).   The risks of surgery were discussed in detail with the patient including but not limited to: bleeding which may require transfusion or reoperation; infection which may require antibiotics; injury to bowel, bladder, ureters or other  surrounding organs; need for additional procedures including laparotomy or subsequent procedures secondary to abnormal pathology; formation of adhesions; thromboembolic phenomenon; incisional problems and other postoperative/anesthesia complications.  Patient was also advised that she will remain in house for 1 night; and expected recovery time after a hysterectomy is 6-8 weeks.  Likelihood of success in alleviating the patient's condition was discussed. Routine postoperative instructions will be reviewed with the patient and her family in detail after surgery.  The patient concurred with the proposed plan, giving informed written consent for the surgery.  Patient has been NPO since last night and she will remain NPO for procedure.  Anesthesia and OR aware.  Preoperative prophylactic antibiotics and SCDs ordered on call to the OR.  To OR when ready.    [name], MD, FACOG Obstetrician & Gynecologist, [facility] for [employer], [facility]

## 2023-11-17 ENCOUNTER — Ambulatory Visit (HOSPITAL_BASED_OUTPATIENT_CLINIC_OR_DEPARTMENT_OTHER): Payer: MEDICAID | Admitting: Anesthesiology

## 2023-11-17 ENCOUNTER — Other Ambulatory Visit: Payer: Self-pay

## 2023-11-17 ENCOUNTER — Encounter (HOSPITAL_BASED_OUTPATIENT_CLINIC_OR_DEPARTMENT_OTHER): Payer: Self-pay | Admitting: Obstetrics & Gynecology

## 2023-11-17 ENCOUNTER — Encounter (HOSPITAL_BASED_OUTPATIENT_CLINIC_OR_DEPARTMENT_OTHER)
Admission: RE | Disposition: A | Discharge status: Home / Self Care | Payer: Self-pay | Source: Home / Self Care | Attending: Obstetrics & Gynecology

## 2023-11-17 ENCOUNTER — Ambulatory Visit (HOSPITAL_COMMUNITY)
Admission: RE | Admit: 2023-11-17 | Discharge: 2023-11-18 | Disposition: A | Discharge status: Home / Self Care | Payer: MEDICAID | Attending: Obstetrics & Gynecology | Admitting: Obstetrics & Gynecology

## 2023-11-17 ENCOUNTER — Telehealth: Payer: Self-pay

## 2023-11-17 DIAGNOSIS — N921 Excessive and frequent menstruation with irregular cycle: Secondary | ICD-10-CM

## 2023-11-17 DIAGNOSIS — I10 Essential (primary) hypertension: Secondary | ICD-10-CM | POA: Diagnosis not present

## 2023-11-17 DIAGNOSIS — Z01818 Encounter for other preprocedural examination: Secondary | ICD-10-CM

## 2023-11-17 DIAGNOSIS — Z90710 Acquired absence of both cervix and uterus: Secondary | ICD-10-CM | POA: Diagnosis present

## 2023-11-17 HISTORY — DX: Abnormal uterine and vaginal bleeding, unspecified: N93.9

## 2023-11-17 HISTORY — DX: Personal history of other complications of pregnancy, childbirth and the puerperium: Z87.59

## 2023-11-17 HISTORY — DX: Personal history of urinary calculi: Z87.442

## 2023-11-17 HISTORY — DX: Excessive and frequent menstruation with irregular cycle: N92.1

## 2023-11-17 HISTORY — PX: VAGINAL HYSTERECTOMY: SHX2639

## 2023-11-17 HISTORY — DX: Constipation, unspecified: K59.00

## 2023-11-17 LAB — TYPE AND SCREEN
ABO/RH(D): O NEG
Antibody Screen: NEGATIVE

## 2023-11-17 LAB — POCT PREGNANCY, URINE: Preg Test, Ur: NEGATIVE

## 2023-11-17 SURGERY — HYSTERECTOMY, VAGINAL
Anesthesia: General | Site: Vagina

## 2023-11-17 MED ORDER — CELECOXIB 200 MG PO CAPS
ORAL_CAPSULE | ORAL | Status: AC
  Filled 2023-11-17: qty 2

## 2023-11-17 MED ORDER — TRANEXAMIC ACID-NACL 1000-0.7 MG/100ML-% IV SOLN
1000.0000 mg | Freq: Once | INTRAVENOUS | Status: AC
  Administered 2023-11-17: 1000 mg via INTRAVENOUS

## 2023-11-17 MED ORDER — LACTATED RINGERS IV SOLN: INTRAVENOUS | Status: AC

## 2023-11-17 MED ORDER — BUPIVACAINE-EPINEPHRINE (PF) 0.25% -1:200000 IJ SOLN
INTRAMUSCULAR | Status: AC
  Filled 2023-11-17: qty 30

## 2023-11-17 MED ORDER — ROCURONIUM BROMIDE 10 MG/ML (PF) SYRINGE
PREFILLED_SYRINGE | INTRAVENOUS | Status: AC
  Filled 2023-11-17: qty 10

## 2023-11-17 MED ORDER — LACTATED RINGERS IV SOLN: INTRAVENOUS | Status: DC

## 2023-11-17 MED ORDER — PROPOFOL 10 MG/ML IV BOLUS
INTRAVENOUS | Status: AC
  Filled 2023-11-17: qty 20

## 2023-11-17 MED ORDER — HYDROMORPHONE HCL 2 MG/ML IJ SOLN
INTRAMUSCULAR | Status: AC
  Filled 2023-11-17: qty 1

## 2023-11-17 MED ORDER — ONDANSETRON HCL 4 MG PO TABS
4.0000 mg | ORAL_TABLET | Freq: Four times a day (QID) | ORAL | Status: DC | PRN
Start: 2023-11-17 — End: 2023-11-18
  Administered 2023-11-17: 4 mg via ORAL

## 2023-11-17 MED ORDER — GABAPENTIN 100 MG PO CAPS
ORAL_CAPSULE | ORAL | Status: AC
  Filled 2023-11-17: qty 1

## 2023-11-17 MED ORDER — OXYCODONE HCL 5 MG PO TABS
5.0000 mg | ORAL_TABLET | ORAL | Status: DC | PRN
  Administered 2023-11-17 (×3): 5 mg via ORAL
  Administered 2023-11-18 (×2): 10 mg via ORAL

## 2023-11-17 MED ORDER — BUPIVACAINE-EPINEPHRINE 0.25% -1:200000 IJ SOLN
INTRAMUSCULAR | Status: DC | PRN
Start: 2023-11-17 — End: 2023-11-17
  Administered 2023-11-17: 30 mL

## 2023-11-17 MED ORDER — SIMETHICONE 80 MG PO CHEW: 80.0000 mg | CHEWABLE_TABLET | Freq: Four times a day (QID) | ORAL | Status: DC | PRN

## 2023-11-17 MED ORDER — DOCUSATE SODIUM 100 MG PO CAPS
ORAL_CAPSULE | ORAL | Status: AC
  Filled 2023-11-17: qty 1

## 2023-11-17 MED ORDER — GABAPENTIN 100 MG PO CAPS
100.0000 mg | ORAL_CAPSULE | Freq: Two times a day (BID) | ORAL | Status: DC
  Administered 2023-11-17: 100 mg via ORAL

## 2023-11-17 MED ORDER — BISACODYL 10 MG RE SUPP: 10.0000 mg | Freq: Every day | RECTAL | Status: DC | PRN

## 2023-11-17 MED ORDER — PANTOPRAZOLE SODIUM 40 MG PO TBEC: 40.0000 mg | DELAYED_RELEASE_TABLET | Freq: Every day | ORAL | Status: DC

## 2023-11-17 MED ORDER — ONDANSETRON HCL 4 MG/2ML IJ SOLN
INTRAMUSCULAR | Status: DC | PRN
  Administered 2023-11-17: 4 mg via INTRAVENOUS

## 2023-11-17 MED ORDER — KETOROLAC TROMETHAMINE 30 MG/ML IJ SOLN
30.0000 mg | Freq: Four times a day (QID) | INTRAMUSCULAR | Status: DC
  Administered 2023-11-17 – 2023-11-18 (×3): 30 mg via INTRAVENOUS

## 2023-11-17 MED ORDER — ACETAMINOPHEN 500 MG PO TABS
ORAL_TABLET | ORAL | Status: AC
  Filled 2023-11-17: qty 2

## 2023-11-17 MED ORDER — LIDOCAINE HCL (CARDIAC) PF 100 MG/5ML IV SOSY
PREFILLED_SYRINGE | INTRAVENOUS | Status: DC | PRN
  Administered 2023-11-17: 100 mg via INTRAVENOUS

## 2023-11-17 MED ORDER — PROPOFOL 500 MG/50ML IV EMUL
INTRAVENOUS | Status: DC | PRN
  Administered 2023-11-17: 160 mg via INTRAVENOUS

## 2023-11-17 MED ORDER — CEFOTETAN DISODIUM 2 G IJ SOLR
2.0000 g | INTRAMUSCULAR | Status: AC
  Administered 2023-11-17: 2 g via INTRAVENOUS

## 2023-11-17 MED ORDER — SUGAMMADEX SODIUM 200 MG/2ML IV SOLN
INTRAVENOUS | Status: DC | PRN
  Administered 2023-11-17: 200 mg via INTRAVENOUS

## 2023-11-17 MED ORDER — POVIDONE-IODINE 10 % EX SWAB
2.0000 | Freq: Once | CUTANEOUS | Status: AC
  Administered 2023-11-17: 2 via TOPICAL

## 2023-11-17 MED ORDER — IBUPROFEN 200 MG PO TABS: 600.0000 mg | ORAL_TABLET | Freq: Four times a day (QID) | ORAL | Status: DC

## 2023-11-17 MED ORDER — MIDAZOLAM HCL 2 MG/2ML IJ SOLN
INTRAMUSCULAR | Status: AC
  Filled 2023-11-17: qty 2

## 2023-11-17 MED ORDER — FENTANYL CITRATE (PF) 100 MCG/2ML IJ SOLN
25.0000 ug | INTRAMUSCULAR | Status: DC | PRN
  Administered 2023-11-17 (×2): 50 ug via INTRAVENOUS

## 2023-11-17 MED ORDER — ACETAMINOPHEN 10 MG/ML IV SOLN
INTRAVENOUS | Status: AC
Start: 2023-11-17 — End: ?
  Filled 2023-11-17: qty 100

## 2023-11-17 MED ORDER — OXYCODONE HCL 5 MG PO TABS
ORAL_TABLET | ORAL | Status: AC
  Filled 2023-11-17: qty 1

## 2023-11-17 MED ORDER — CELECOXIB 200 MG PO CAPS
400.0000 mg | ORAL_CAPSULE | ORAL | Status: AC
  Administered 2023-11-17: 400 mg via ORAL

## 2023-11-17 MED ORDER — SENNOSIDES-DOCUSATE SODIUM 8.6-50 MG PO TABS: 1.0000 | ORAL_TABLET | Freq: Every evening | ORAL | Status: DC | PRN

## 2023-11-17 MED ORDER — DEXMEDETOMIDINE HCL IN NACL 80 MCG/20ML IV SOLN
INTRAVENOUS | Status: AC
  Filled 2023-11-17: qty 20

## 2023-11-17 MED ORDER — AMLODIPINE BESYLATE 5 MG PO TABS
5.0000 mg | ORAL_TABLET | Freq: Every day | ORAL | Status: DC
  Administered 2023-11-18: 5 mg via ORAL
  Filled 2023-11-17: qty 1

## 2023-11-17 MED ORDER — ONDANSETRON HCL 4 MG PO TABS
ORAL_TABLET | ORAL | Status: AC
  Filled 2023-11-17: qty 1

## 2023-11-17 MED ORDER — SODIUM CHLORIDE 0.9 % IV SOLN
INTRAVENOUS | Status: AC
  Filled 2023-11-17: qty 2

## 2023-11-17 MED ORDER — MENTHOL 3 MG MT LOZG: 1.0000 | LOZENGE | OROMUCOSAL | Status: DC | PRN

## 2023-11-17 MED ORDER — OXYCODONE HCL 5 MG/5ML PO SOLN: 5.0000 mg | Freq: Once | ORAL | Status: AC | PRN

## 2023-11-17 MED ORDER — ONDANSETRON HCL 4 MG/2ML IJ SOLN
INTRAMUSCULAR | Status: AC
  Filled 2023-11-17: qty 2

## 2023-11-17 MED ORDER — TRANEXAMIC ACID-NACL 1000-0.7 MG/100ML-% IV SOLN
INTRAVENOUS | Status: AC
  Filled 2023-11-17: qty 100

## 2023-11-17 MED ORDER — FENTANYL CITRATE (PF) 100 MCG/2ML IJ SOLN
INTRAMUSCULAR | Status: AC
  Filled 2023-11-17: qty 2

## 2023-11-17 MED ORDER — ACETAMINOPHEN 500 MG PO TABS
1000.0000 mg | ORAL_TABLET | ORAL | Status: AC
  Administered 2023-11-17: 1000 mg via ORAL

## 2023-11-17 MED ORDER — DEXAMETHASONE SODIUM PHOSPHATE 10 MG/ML IJ SOLN
INTRAMUSCULAR | Status: AC
  Filled 2023-11-17: qty 1

## 2023-11-17 MED ORDER — HYDROMORPHONE HCL 1 MG/ML IJ SOLN
INTRAMUSCULAR | Status: DC | PRN
  Administered 2023-11-17 (×2): .5 mg via INTRAVENOUS

## 2023-11-17 MED ORDER — DEXAMETHASONE SODIUM PHOSPHATE 10 MG/ML IJ SOLN
INTRAMUSCULAR | Status: DC | PRN
  Administered 2023-11-17: 10 mg via INTRAVENOUS

## 2023-11-17 MED ORDER — ROCURONIUM BROMIDE 100 MG/10ML IV SOLN
INTRAVENOUS | Status: DC | PRN
  Administered 2023-11-17: 80 mg via INTRAVENOUS

## 2023-11-17 MED ORDER — DOCUSATE SODIUM 100 MG PO CAPS
100.0000 mg | ORAL_CAPSULE | Freq: Two times a day (BID) | ORAL | Status: DC
  Administered 2023-11-17: 100 mg via ORAL

## 2023-11-17 MED ORDER — OXYCODONE HCL 5 MG PO TABS
5.0000 mg | ORAL_TABLET | Freq: Once | ORAL | Status: AC | PRN
  Administered 2023-11-17: 5 mg via ORAL

## 2023-11-17 MED ORDER — HYDROMORPHONE HCL 1 MG/ML IJ SOLN
INTRAMUSCULAR | Status: AC
  Filled 2023-11-17: qty 1

## 2023-11-17 MED ORDER — HYDROMORPHONE HCL 1 MG/ML IJ SOLN
0.2000 mg | INTRAMUSCULAR | Status: DC | PRN
  Administered 2023-11-17 – 2023-11-18 (×2): 0.5 mg via INTRAVENOUS

## 2023-11-17 MED ORDER — FENTANYL CITRATE (PF) 100 MCG/2ML IJ SOLN
INTRAMUSCULAR | Status: DC | PRN
  Administered 2023-11-17: 50 ug via INTRAVENOUS
  Administered 2023-11-17: 100 ug via INTRAVENOUS
  Administered 2023-11-17: 50 ug via INTRAVENOUS

## 2023-11-17 MED ORDER — ONDANSETRON HCL 4 MG/2ML IJ SOLN
4.0000 mg | Freq: Once | INTRAMUSCULAR | Status: AC | PRN
  Administered 2023-11-17: 4 mg via INTRAVENOUS

## 2023-11-17 MED ORDER — MAGNESIUM CITRATE PO SOLN: 1.0000 | Freq: Once | ORAL | Status: DC | PRN

## 2023-11-17 MED ORDER — ACETAMINOPHEN 500 MG PO TABS
1000.0000 mg | ORAL_TABLET | Freq: Four times a day (QID) | ORAL | Status: DC
  Administered 2023-11-18 (×2): 1000 mg via ORAL

## 2023-11-17 MED ORDER — GABAPENTIN 300 MG PO CAPS
300.0000 mg | ORAL_CAPSULE | ORAL | Status: AC
Start: 2023-11-17 — End: 2023-11-17
  Administered 2023-11-17: 300 mg via ORAL

## 2023-11-17 MED ORDER — ONDANSETRON HCL 4 MG/2ML IJ SOLN
4.0000 mg | Freq: Four times a day (QID) | INTRAMUSCULAR | Status: DC | PRN
  Administered 2023-11-18: 4 mg via INTRAVENOUS

## 2023-11-17 MED ORDER — DEXMEDETOMIDINE HCL IN NACL 80 MCG/20ML IV SOLN
INTRAVENOUS | Status: DC | PRN
  Administered 2023-11-17: 8 ug via INTRAVENOUS
  Administered 2023-11-17: 12 ug via INTRAVENOUS

## 2023-11-17 MED ORDER — MIDAZOLAM HCL 5 MG/5ML IJ SOLN
INTRAMUSCULAR | Status: DC | PRN
  Administered 2023-11-17: 2 mg via INTRAVENOUS

## 2023-11-17 MED ORDER — OXYCODONE HCL 5 MG PO TABS
ORAL_TABLET | ORAL | Status: AC
Start: 2023-11-17 — End: ?
  Filled 2023-11-17: qty 1

## 2023-11-17 MED ORDER — ACETAMINOPHEN 10 MG/ML IV SOLN
1000.0000 mg | Freq: Once | INTRAVENOUS | Status: DC | PRN
  Administered 2023-11-17: 1000 mg via INTRAVENOUS

## 2023-11-17 MED ORDER — LABETALOL HCL 5 MG/ML IV SOLN
20.0000 mg | Freq: Once | INTRAVENOUS | Status: AC
  Administered 2023-11-17: 20 mg via INTRAVENOUS

## 2023-11-17 MED ORDER — KETOROLAC TROMETHAMINE 30 MG/ML IJ SOLN
INTRAMUSCULAR | Status: AC
  Filled 2023-11-17: qty 1

## 2023-11-17 MED ORDER — ALUM & MAG HYDROXIDE-SIMETH 200-200-20 MG/5ML PO SUSP: 30.0000 mL | ORAL | Status: DC | PRN

## 2023-11-17 MED ORDER — GABAPENTIN 300 MG PO CAPS
ORAL_CAPSULE | ORAL | Status: AC
  Filled 2023-11-17: qty 1

## 2023-11-17 MED ORDER — LIDOCAINE HCL (PF) 2 % IJ SOLN
INTRAMUSCULAR | Status: AC
  Filled 2023-11-17: qty 5

## 2023-11-17 MED ORDER — ZOLPIDEM TARTRATE 5 MG PO TABS: 5.0000 mg | ORAL_TABLET | Freq: Every evening | ORAL | Status: DC | PRN

## 2023-11-17 MED ORDER — LABETALOL HCL 5 MG/ML IV SOLN
INTRAVENOUS | Status: AC
  Filled 2023-11-17: qty 4

## 2023-11-17 SURGICAL SUPPLY — 21 items
APPLICATOR COTTON TIP 6 STRL (MISCELLANEOUS) ×1 IMPLANT
APPLICATOR COTTON TIP 6IN STRL (MISCELLANEOUS) ×1 IMPLANT
BAG COUNTER SPONGE SURGICOUNT (BAG) ×1 IMPLANT
COVER MAYO STAND STRL (DRAPES) ×1 IMPLANT
GLOVE BIOGEL PI IND STRL 7.0 (GLOVE) ×1 IMPLANT
GLOVE ECLIPSE 6.5 STRL STRAW (GLOVE) ×1 IMPLANT
GLOVE ECLIPSE 7.0 STRL STRAW (GLOVE) ×1 IMPLANT
KIT TURNOVER CYSTO (KITS) ×1 IMPLANT
LIGASURE IMPACT 36 18CM CVD LR (INSTRUMENTS) ×1 IMPLANT
NDL MAYO CATGUT SZ4 TPR NDL (NEEDLE) IMPLANT
NEEDLE MAYO CATGUT SZ4 (NEEDLE) IMPLANT
NS IRRIG 1000ML POUR BTL (IV SOLUTION) ×1 IMPLANT
PACK VAGINAL WOMENS (CUSTOM PROCEDURE TRAY) ×1 IMPLANT
SLEEVE SCD COMPRESS KNEE MED (STOCKING) ×1 IMPLANT
SPIKE FLUID TRANSFER (MISCELLANEOUS) ×1 IMPLANT
SUT VIC AB 0 CT1 18XCR BRD8 (SUTURE) ×2 IMPLANT
SUT VIC AB 0 CT1 36 (SUTURE) ×1 IMPLANT
SUT VICRYL 0 TIES 12 18 (SUTURE) ×1 IMPLANT
TOWEL OR 17X24 6PK STRL BLUE (TOWEL DISPOSABLE) ×1 IMPLANT
TRAY FOLEY W/BAG SLVR 14FR LF (SET/KITS/TRAYS/PACK) ×1 IMPLANT
UNDERPAD 30X36 HEAVY ABSORB (UNDERPADS AND DIAPERS) ×1 IMPLANT

## 2023-11-17 NOTE — Telephone Encounter (Signed)
Called pt to notify of post op appt details. No VM set up

## 2023-11-17 NOTE — Op Note (Signed)
Angel Pratt PROCEDURE DATE: 11/17/2023  PREOPERATIVE DIAGNOSIS:  Menometrorrhagia POSTOPERATIVE DIAGNOSIS:  Menometrorrhagia SURGEON:  Dr. [name] ASSISTANT:  Dr. Scheryl Darter and Clement Sayres, PA-C.  Experienced assistants were required given the standard of surgical care given the complexity of the case.  The assistants were needed for exposure, dissection, suctioning, retraction, instrument exchange, and for overall help during the procedure. OPERATION:  Total Vaginal Hysterectomy, Bilateral Salpingectomy ANESTHESIA:  General endotracheal.  INDICATIONS: The patient is a 33 y.o. [id number] with history of menometrorrhagia. The patient made a decision to undergo definite surgical treatment. On the preoperative visit, the risks, benefits, indications, and alternatives of the procedure were reviewed with the patient.  On the day of surgery, the risks of surgery were again discussed with the patient including but not limited to: bleeding which may require transfusion or reoperation; infection which may require antibiotics; injury to bowel, bladder, ureters or other surrounding organs; need for additional procedures; risk of pelvic organ prolapse; thromboembolic phenomenon, incisional problems and other postoperative/anesthesia complications. Written informed consent was obtained.    OPERATIVE FINDINGS: A 9 week size uterus with normal tubes and ovaries bilaterally.  ESTIMATED BLOOD LOSS: 100 ml SPECIMENS:  Uterus and cervix and bilateral fallopian tubes sent to pathology COMPLICATIONS:  None immediate.  DESCRIPTION OF PROCEDURE:  The patient received intravenous antibiotics and had sequential compression devices applied to her lower extremities while in the preoperative area.  She was then taken to the operating room where general anesthesia was administered and was found to be adequate.  She was placed in the dorsal lithotomy position, and was prepped and draped in a sterile manner.  A Foley  catheter was inserted into her bladder and attached to constant drainage. After an adequate timeout was performed, attention was turned to her pelvis.  A weighted speculum was then placed in the vagina, and the anterior and posterior lips of the cervix were grasped bilaterally with tenaculums.  The cervix was then injected circumferentially with 0.25% Marcaine with epinephrine solution to maintain hemostasis.  The cervix was then circumferentially incised, and the bladder was dissected off the pubocervical fascia anteriorly without complication.  The anterior cul-de-sac was then entered sharply without difficulty and a retractor was placed.  The same procedure was performed posteriorly and the posterior cul-de-sac was entered sharply without difficulty.   The anterior and posterior cuffs were reefed to their respective adjacent peritoneal layers.   A long weighted speculum was inserted into the posterior cul-de-sac.  The Heaney clamp was then used to clamp the uterosacral ligaments on either side.  They were then cut and sutured ligated with 0 Vicryl, and the ligated uterosacral ligaments were transfixed to the ipsilateral vaginal epithelium to further support the vagina and provide hemostasis.   Of note, all sutures used in this case were 0 Vicryl unless otherwise noted.     The cardinal ligaments were then clamped, cauterized and cut with the Ligasure Impact Device. The uterine vessels and broad ligaments were then serially clamped, cauterized and cut on both sides with the Ligasure. The uterus has to be cored to help with exposure and to be able to get to the uteroovarian ligaments.  The uterus was then delivered via the posterior cul-de-sac, and the cornua were clamped, cauterized and cut with the Ligasure.  The fallopian tubes including the fimbria were clamped, cauterized and cut with the Ligasure.  All pedicles from the uterosacral ligament to the cornua were examined hemostasis was confirmed.   The  vaginal cuff was then closed with a in a running locked fashion with care given to incorporate the uterosacral pedicles bilaterally.  All instruments were then removed from the pelvis.  The patient tolerated the procedure well.  All instruments, needles, and sponge counts were correct times three.  The patient was extubated successfully, and was taken to the recovery room in stable condition.      [name], MD, FACOG Obstetrician & Gynecologist, [facility] for [employer], [facility]

## 2023-11-17 NOTE — Anesthesia Postprocedure Evaluation (Signed)
Anesthesia Post Note  Patient: Angel Pratt  Procedure(s) Performed: TOTAL HYSTERECTOMY VAGINAL WITH T/O (Vagina )     Patient location during evaluation: PACU Anesthesia Type: General Level of consciousness: awake and alert Pain management: pain level controlled Vital Signs Assessment: post-procedure vital signs reviewed and stable Respiratory status: spontaneous breathing, nonlabored ventilation, respiratory function stable and patient connected to nasal cannula oxygen Cardiovascular status: blood pressure returned to baseline and stable Postop Assessment: no apparent nausea or vomiting Anesthetic complications: no   No notable events documented.  Last Vitals:  Vitals:   11/17/23 1057 11/17/23 1106  BP:  (!) 138/100  Pulse: (!) 104 94  Resp: 20 18  Temp:  (!) 36.4 C  SpO2: 96% 97%    Last Pain:  Vitals:   11/17/23 1106  TempSrc:   PainSc: 3                  Mariann Barter

## 2023-11-17 NOTE — Transfer of Care (Signed)
Immediate Anesthesia Transfer of Care Note  Patient: Angel Pratt  Procedure(s) Performed: TOTAL HYSTERECTOMY VAGINAL WITH T/O (Vagina )  Patient Location: PACU  Anesthesia Type:General  Level of Consciousness: awake, alert , oriented, and patient cooperative  Airway & Oxygen Therapy: Patient Spontanous Breathing  Post-op Assessment: Report given to RN and Post -op Vital signs reviewed and stable  Post vital signs: Reviewed and stable  Last Vitals:  Vitals Value Taken Time  BP 173/96 11/17/23 1003  Temp 36.7 C 11/17/23 1002  Pulse 100 11/17/23 1003  Resp 17 11/17/23 1003  SpO2 95 % 11/17/23 1003  Vitals shown include unfiled device data.  Last Pain:  Vitals:   11/17/23 0747  TempSrc: Oral  PainSc: 0-No pain      Patients Stated Pain Goal: 5 (11/17/23 0747)  Complications: No notable events documented.

## 2023-11-17 NOTE — Anesthesia Procedure Notes (Signed)
Procedure Name: Intubation Date/Time: 11/17/2023 8:34 AM  Performed by: Bishop Limbo, CRNAPre-anesthesia Checklist: Patient identified, Emergency Drugs available, Suction available and Patient being monitored Patient Re-evaluated:Patient Re-evaluated prior to induction Oxygen Delivery Method: Circle System Utilized Preoxygenation: Pre-oxygenation with 100% oxygen Induction Type: IV induction Ventilation: Mask ventilation without difficulty Laryngoscope Size: Mac and 3 Grade View: Grade I Tube type: Oral Tube size: 7.0 mm Number of attempts: 1 Airway Equipment and Method: Stylet Placement Confirmation: ETT inserted through vocal cords under direct vision, positive ETCO2 and breath sounds checked- equal and bilateral Secured at: 22 cm Tube secured with: Tape Dental Injury: Teeth and Oropharynx as per pre-operative assessment

## 2023-11-17 NOTE — Anesthesia Preprocedure Evaluation (Signed)
Anesthesia Evaluation  Patient identified by MRN, date of birth, ID band Patient awake    Reviewed: Allergy & Precautions, NPO status , Patient's Chart, lab work & pertinent test results, reviewed documented beta blocker date and time   History of Anesthesia Complications Negative for: history of anesthetic complications  Airway Mallampati: III  TM Distance: >3 FB Neck ROM: Full    Dental no notable dental hx.    Pulmonary neg COPD, neg PE   breath sounds clear to auscultation       Cardiovascular Exercise Tolerance: Good (-) hypertension(-) angina (-) CAD, (-) Past MI and (-) CHF  Rhythm:Regular Rate:Normal     Neuro/Psych neg Seizures    GI/Hepatic ,neg GERD  ,,(+) neg Cirrhosis        Endo/Other    Renal/GU Renal disease     Musculoskeletal   Abdominal   Peds  Hematology   Anesthesia Other Findings   Reproductive/Obstetrics                              Anesthesia Physical Anesthesia Plan  ASA: 2  Anesthesia Plan: General   Post-op Pain Management:    Induction: Intravenous  PONV Risk Score and Plan: 2 and Ondansetron and Dexamethasone  Airway Management Planned: Oral ETT  Additional Equipment:   Intra-op Plan:   Post-operative Plan: Extubation in OR  Informed Consent: I have reviewed the patients History and Physical, chart, labs and discussed the procedure including the risks, benefits and alternatives for the proposed anesthesia with the patient or authorized representative who has indicated his/her understanding and acceptance.     Dental advisory given  Plan Discussed with: CRNA  Anesthesia Plan Comments:          Anesthesia Quick Evaluation

## 2023-11-18 ENCOUNTER — Other Ambulatory Visit: Payer: Self-pay | Admitting: Obstetrics and Gynecology

## 2023-11-18 ENCOUNTER — Encounter (HOSPITAL_BASED_OUTPATIENT_CLINIC_OR_DEPARTMENT_OTHER): Payer: Self-pay | Admitting: Obstetrics & Gynecology

## 2023-11-18 ENCOUNTER — Other Ambulatory Visit: Payer: Self-pay | Admitting: Obstetrics & Gynecology

## 2023-11-18 DIAGNOSIS — N921 Excessive and frequent menstruation with irregular cycle: Secondary | ICD-10-CM | POA: Diagnosis not present

## 2023-11-18 DIAGNOSIS — I10 Essential (primary) hypertension: Secondary | ICD-10-CM | POA: Diagnosis present

## 2023-11-18 LAB — BASIC METABOLIC PANEL
Anion gap: 9 (ref 5–15)
BUN: 9 mg/dL (ref 6–20)
CO2: 22 mmol/L (ref 22–32)
Calcium: 9.2 mg/dL (ref 8.9–10.3)
Chloride: 104 mmol/L (ref 98–111)
Creatinine, Ser: 0.73 mg/dL (ref 0.44–1.00)
GFR, Estimated: >60 mL/min (ref >60)
Glucose, Bld: 109 mg/dL — ABNORMAL HIGH (ref 70–99)
Potassium: 4 mmol/L (ref 3.5–5.1)
Sodium: 135 mmol/L (ref 135–145)

## 2023-11-18 LAB — CBC
HCT: 36.8 % (ref 36.0–46.0)
Hemoglobin: 11.6 g/dL — ABNORMAL LOW (ref 12.0–15.0)
MCH: 24.6 pg — ABNORMAL LOW (ref 26.0–34.0)
MCHC: 31.5 g/dL (ref 30.0–36.0)
MCV: 78 fL — ABNORMAL LOW (ref 80.0–100.0)
Platelets: 408 10*3/uL — ABNORMAL HIGH (ref 150–400)
RBC: 4.72 MIL/uL (ref 3.87–5.11)
RDW: 14.6 % (ref 11.5–15.5)
WBC: 17.9 10*3/uL — ABNORMAL HIGH (ref 4.0–10.5)
nRBC: 0 % (ref 0.0–0.2)

## 2023-11-18 LAB — SURGICAL PATHOLOGY

## 2023-11-18 MED ORDER — OXYCODONE HCL 5 MG PO TABS: 5.0000 mg | ORAL_TABLET | ORAL | 0 refills | Status: DC | PRN

## 2023-11-18 MED ORDER — GABAPENTIN 100 MG PO CAPS: 100.0000 mg | ORAL_CAPSULE | Freq: Two times a day (BID) | ORAL | 2 refills | Status: AC

## 2023-11-18 MED ORDER — OXYCODONE HCL 5 MG PO TABS
5.0000 mg | ORAL_TABLET | ORAL | 0 refills | Status: DC | PRN
Start: 2023-11-18 — End: 2024-01-04

## 2023-11-18 MED ORDER — AMLODIPINE BESYLATE 5 MG PO TABS: 5.0000 mg | ORAL_TABLET | Freq: Every day | ORAL | 3 refills | Status: DC

## 2023-11-18 MED ORDER — IBUPROFEN 600 MG PO TABS: 600.0000 mg | ORAL_TABLET | Freq: Four times a day (QID) | ORAL | Status: AC | PRN

## 2023-11-18 MED ORDER — ACETAMINOPHEN 500 MG PO TABS
ORAL_TABLET | ORAL | Status: AC
  Filled 2023-11-18: qty 2

## 2023-11-18 MED ORDER — CEFADROXIL 500 MG PO CAPS: 500.0000 mg | ORAL_CAPSULE | Freq: Two times a day (BID) | ORAL | 0 refills | Status: AC

## 2023-11-18 MED ORDER — DOCUSATE SODIUM 100 MG PO CAPS: 100.0000 mg | ORAL_CAPSULE | Freq: Two times a day (BID) | ORAL | Status: AC | PRN

## 2023-11-18 MED ORDER — OXYCODONE HCL 5 MG PO TABS
ORAL_TABLET | ORAL | Status: AC
  Filled 2023-11-18: qty 2

## 2023-11-18 MED ORDER — POLYETHYLENE GLYCOL 3350 17 G PO PACK
17.0000 g | PACK | Freq: Every day | ORAL | Status: DC | PRN
Start: 2023-11-18 — End: 2024-01-04

## 2023-11-18 MED ORDER — KETOROLAC TROMETHAMINE 30 MG/ML IJ SOLN
INTRAMUSCULAR | Status: AC
  Filled 2023-11-18: qty 1

## 2023-11-18 MED ORDER — ACETAMINOPHEN 500 MG PO TABS: 1000.0000 mg | ORAL_TABLET | Freq: Four times a day (QID) | ORAL | Status: AC | PRN

## 2023-11-18 MED ORDER — ONDANSETRON HCL 4 MG/2ML IJ SOLN
INTRAMUSCULAR | Status: AC
  Filled 2023-11-18: qty 2

## 2023-11-18 NOTE — Discharge Summary (Signed)
Gynecology Physician Postoperative Discharge Summary  Patient ID: Angel Pratt MRN: [id number] DOB/AGE: 05/28/1991 33 y.o.  Admit Date: 11/17/2023 Discharge Date: 11/18/2023  Preoperative Diagnoses: Menometrorrhagia  Procedures: Procedure(s) (LRB): TOTAL HYSTERECTOMY VAGINAL WITH T/O (N/A)  Hospital Course:  NAIMAH YINGST is a 33 y.o. [id number] admitted for scheduled surgery.  She underwent the procedures as mentioned above, her operation was uncomplicated. For further details about surgery, please refer to the operative report. Patient had an postoperative course complicated by elevated BPs; on review of chart, patient had undiagnosed HTN.  Needed Labetalol 220 mg IV x 1 to bring it down from severe ranges on POD#0, and she was started on Amlodipine 5 mg po daily.  She will follow up with PCP after wards for further management, BP checks at home recommended. By time of discharge on POD#1, her BP was improved, her pain was managed on oral pain medications; she was ambulating, voiding without difficulty, tolerating regular diet and passing flatus. She was deemed stable for discharge to home.   Significant Labs:    Latest Ref Rng & Units 11/18/2023    3:30 AM 11/15/2023    1:16 PM 09/13/2023    4:30 PM  CBC  WBC 4.0 - 10.5 K/uL 17.9  12.6  13.1   Hemoglobin 12.0 - 15.0 g/dL 46.9  62.9  52.8   Hematocrit 36.0 - 46.0 % 36.8  43.8  40.3   Platelets 150 - 400 K/uL 408  385  483       Latest Ref Rng & Units 11/18/2023    3:30 AM 11/12/2015    4:46 PM 08/29/2015   11:43 PM  BMP  Glucose 70 - 99 mg/dL 413  244  010   BUN 6 - 20 mg/dL 9  9  10    Creatinine 0.44 - 1.00 mg/dL 2.72  5.36  6.44   Sodium 135 - 145 mmol/L 135  136  138   Potassium 3.5 - 5.1 mmol/L 4.0  4.1  3.7   Chloride 98 - 111 mmol/L 104  106  107   CO2 22 - 32 mmol/L 22  21  23    Calcium 8.9 - 10.3 mg/dL 9.2  9.2  9.3     Discharge Exam: Blood pressure (!) 139/93, pulse 79, temperature 98.2 F (36.8 C), resp. rate 20,  height 5\' 2"  (1.575 m), weight 99.2 kg, SpO2 100%, not currently breastfeeding. General appearance: alert and no distress  Resp: clear to auscultation bilaterally  Cardio: regular rate and rhythm  GI: soft, non-tender; bowel sounds normal; no masses, no organomegaly.  Pelvic: scant blood on pad reported by patient. Extremities: extremities normal, atraumatic, no cyanosis or edema and Homans sign is negative, no sign of DVT  Discharged Condition: Stable  Disposition: Discharge disposition: 01-Home or Self Care      Discharge Instructions     Call MD for:  difficulty breathing, headache or visual disturbances   Complete by: As directed    Call MD for:  persistant dizziness or light-headedness   Complete by: As directed    Call MD for:  persistant nausea and vomiting   Complete by: As directed    Call MD for:  severe uncontrolled pain   Complete by: As directed    Call MD for:  temperature >100.4   Complete by: As directed    Diet general   Complete by: As directed    Increase activity slowly   Complete by: As directed  Allergies as of 11/18/2023       Reactions   Latex Hives, Shortness Of Breath, Swelling   Codeine Hives   Pt says she can take vicodin        Medication List     STOP taking these medications    nitrofurantoin (macrocrystal-monohydrate) 100 MG capsule Commonly known as: MACROBID       TAKE these medications    acetaminophen 500 MG tablet Commonly known as: TYLENOL Take 2 tablets (1,000 mg total) by mouth every 6 (six) hours as needed for mild pain (pain score 1-3) or moderate pain (pain score 4-6).   amLODipine 5 MG tablet Commonly known as: NORVASC Take 1 tablet (5 mg total) by mouth daily. Start taking on: November 19, 2023   cefadroxil 500 MG capsule Commonly known as: DURICEF Take 1 capsule (500 mg total) by mouth 2 (two) times daily for 5 days.   cholecalciferol 25 MCG (1000 UNIT) tablet Commonly known as: VITAMIN D3 Take  1,000 Units by mouth daily.   docusate sodium 100 MG capsule Commonly known as: COLACE Take 1 capsule (100 mg total) by mouth 2 (two) times daily as needed for mild constipation.   gabapentin 100 MG capsule Commonly known as: NEURONTIN Take 1-2 capsules (100-200 mg total) by mouth 2 (two) times daily.   ibuprofen 600 MG tablet Commonly known as: ADVIL Take 1 tablet (600 mg total) by mouth every 6 (six) hours as needed for headache, moderate pain (pain score 4-6) or cramping.   loratadine-pseudoephedrine 10-240 MG 24 hr tablet Commonly known as: CLARITIN-D 24-hour Take 1 tablet by mouth daily.   oxyCODONE 5 MG immediate release tablet Commonly known as: Oxy IR/ROXICODONE Take 1-2 tablets (5-10 mg total) by mouth every 4 (four) hours as needed for moderate pain (pain score 4-6).   polyethylene glycol 17 g packet Commonly known as: MiraLax Take 17 g by mouth daily as needed for moderate constipation or severe constipation.   PROBIOTIC DAILY PO Take by mouth daily. Per pt 2 chews daily   Vitamin C 500 MG Caps Take 1 capsule by mouth daily.       Future Appointments  Date Time Provider Department Center  11/30/2023  1:30 PM Naliah Eddington, Jethro Bastos, MD [facility]  01/04/2024  8:55 AM Marcy Bogosian, Jethro Bastos, MD [facility]     Total discharge time: 20 minutes   Signed:  [name], MD, FACOG Attending Obstetrician & Gynecologist Faculty [facility], [facility]

## 2023-11-18 NOTE — Progress Notes (Signed)
Notified MD of patient's elevated blood pressure. New orders received from Dr Macon Large. Will implement new orders and recheck patient's blood pressure.

## 2023-11-19 ENCOUNTER — Encounter: Payer: Self-pay | Admitting: Obstetrics & Gynecology

## 2023-11-30 ENCOUNTER — Ambulatory Visit: Payer: Self-pay | Admitting: Obstetrics & Gynecology

## 2023-11-30 VITALS — BP 152/107 | HR 111 | Wt 216.0 lb

## 2023-11-30 DIAGNOSIS — I10 Essential (primary) hypertension: Secondary | ICD-10-CM

## 2023-11-30 DIAGNOSIS — Z09 Encounter for follow-up examination after completed treatment for conditions other than malignant neoplasm: Secondary | ICD-10-CM

## 2023-11-30 DIAGNOSIS — Z90710 Acquired absence of both cervix and uterus: Secondary | ICD-10-CM

## 2023-11-30 MED ORDER — AMLODIPINE BESYLATE 10 MG PO TABS: 10.0000 mg | ORAL_TABLET | Freq: Every day | ORAL | 2 refills | Status: AC

## 2023-11-30 NOTE — Progress Notes (Deleted)
   GYNECOLOGY POSTOPERATIVE VISIT  Subjective:     Angel Pratt is a 33 y.o. female who presents to the clinic 2 weeks status post vaginal hysterectomy with bilateral salpingectomy for menometrorrhagia that was done on 11/17/2023. Eating a regular diet without difficulty. Bowel movements are normal. Pain is controlled with current analgesics.   The following portions of the patient's history were reviewed and updated as appropriate: allergies, current medications, past family history, past medical history, past social history, past surgical history, and problem list.  Review of Systems Pertinent items noted in HPI and remainder of comprehensive ROS otherwise negative.    Objective:    BP (!) 152/107   Pulse (!) 111   Wt 216 lb (98 kg)   LMP  (LMP Unknown)   BMI 39.51 kg/m  General:  alert and no distress  Abdomen: soft, bowel sounds active, non-tender  Pelvic:   deferred   11/17/2023 Surgical pathology UTERUS, CERVIX, FALLOPIAN TUBE, BILATERAL, HYSTERECTOMY:  Cervix:  Unremarkable.  Negative for dysplasia or malignancy.  Endocervix: Nabothian cysts.  Negative for hyperplasia, atypia or malignancy.  Endometrium:  Benign disordered proliferative endometrium.  Negative for hyperplasia, atypia or malignancy.  Myometrium: Unremarkable.  Negative for malignancy.  Serosa: Unremarkable.  Negative for malignancy.   Bilateral fallopian tubes:  Benign fimbriated fallopian tubes.  Negative for malignancy.     Assessment:    Postoperative course complicated by HTN, hard to control Operative findings again reviewed. Pathology report discussed.    Plan:    1. Continue any current medications. Amlodipine increased to 10 mg daily, told to follow up with PCP. 2. Wound care discussed, continue pelvic rest for 8 weeks after surgery. 3. Activity restrictions: none 4. Anticipated return to work: not applicable. 5. Follow up: 4 weeks as scheduled for postoperative pelvic examination.     [name], MD, FACOG Obstetrician & Gynecologist, [facility] [employer], [facility]

## 2023-11-30 NOTE — Progress Notes (Signed)
   GYNECOLOGY POSTOPERATIVE VISIT  Subjective:     Angel Pratt is a 33 y.o. female who presents to the clinic 2 weeks status post vaginal hysterectomy with bilateral salpingectomy for menometrorrhagia that was done on 11/17/2023. Eating a regular diet without difficulty. Bowel movements are normal. Pain is controlled with current analgesics.   The following portions of the patient's history were reviewed and updated as appropriate: allergies, current medications, past family history, past medical history, past social history, past surgical history, and problem list.  Review of Systems Pertinent items noted in HPI and remainder of comprehensive ROS otherwise negative.    Objective:    BP (!) 152/107   Pulse (!) 111   Wt 216 lb (98 kg)   LMP  (LMP Unknown)   BMI 39.51 kg/m  General:  alert and no distress  Abdomen: soft, bowel sounds active, non-tender  Pelvic:   deferred   11/17/2023 Surgical pathology UTERUS, CERVIX, FALLOPIAN TUBE, BILATERAL, HYSTERECTOMY:  Cervix:  Unremarkable.  Negative for dysplasia or malignancy.  Endocervix: Nabothian cysts.  Negative for hyperplasia, atypia or malignancy.  Endometrium:  Benign disordered proliferative endometrium.  Negative for hyperplasia, atypia or malignancy.  Myometrium: Unremarkable.  Negative for malignancy.  Serosa: Unremarkable.  Negative for malignancy.   Bilateral fallopian tubes:  Benign fimbriated fallopian tubes.  Negative for malignancy.     Assessment:    Postoperative course complicated by HTN, hard to control Operative findings again reviewed. Pathology report discussed.    Plan:    1. Continue any current medications. Amlodipine increased to 10 mg daily, told to follow up with PCP. 2. Wound care discussed, continue pelvic rest for 8 weeks after surgery. 3. Activity restrictions: none 4. Anticipated return to work: not applicable. 5. Follow up: 4 weeks as scheduled for postoperative pelvic examination.     [name], MD, FACOG Obstetrician & Gynecologist, [facility] [employer], [facility]

## 2024-01-04 ENCOUNTER — Encounter: Payer: Self-pay | Admitting: Obstetrics & Gynecology

## 2024-01-04 ENCOUNTER — Ambulatory Visit: Payer: Self-pay | Admitting: Obstetrics & Gynecology

## 2024-01-04 VITALS — BP 144/83 | HR 118 | Wt 220.4 lb

## 2024-01-04 DIAGNOSIS — Z09 Encounter for follow-up examination after completed treatment for conditions other than malignant neoplasm: Secondary | ICD-10-CM

## 2024-01-04 DIAGNOSIS — Z90710 Acquired absence of both cervix and uterus: Secondary | ICD-10-CM

## 2024-01-04 NOTE — Progress Notes (Signed)
   GYNECOLOGY POSTOPERATIVE VISIT  Subjective:     Angel Pratt is a 33 y.o. female who presents to the clinic 6 weeks status post vaginal hysterectomy with bilateral salpingectomy for menometrorrhagia that was done on 11/17/2023. Eating a regular diet without difficulty. Bowel movements are normal. Pain is controlled with current analgesics.  No other concerns.  The following portions of the patient's history were reviewed and updated as appropriate: allergies, current medications, past family history, past medical history, past social history, past surgical history, and problem list.  Review of Systems Pertinent items noted in HPI and remainder of comprehensive ROS otherwise negative.    Objective:   Blood pressure (!) 144/83, pulse (!) 118, weight 220 lb 6.4 oz (100 kg). General:  alert and no distress  Abdomen: soft, bowel sounds active, non-tender  Pelvic:   Well healing vaginal cuff, no concerning findings. Done in presence of RN as chaperone.   11/17/2023 Surgical pathology UTERUS, CERVIX, FALLOPIAN TUBE, BILATERAL, HYSTERECTOMY:  Cervix:  Unremarkable.  Negative for dysplasia or malignancy.  Endocervix: Nabothian cysts.  Negative for hyperplasia, atypia or malignancy.  Endometrium:  Benign disordered proliferative endometrium.  Negative for hyperplasia, atypia or malignancy.  Myometrium: Unremarkable.  Negative for malignancy.  Serosa: Unremarkable.  Negative for malignancy.   Bilateral fallopian tubes:  Benign fimbriated fallopian tubes.  Negative for malignancy.     Assessment:   Postoperative course complicated by HTN Operative findings again reviewed. Pathology report discussed.    Plan:    1. Continue any current medications. On Amlodipine 10 mg daily, told to follow up with PCP. 2. Wound care discussed, continue pelvic rest for 8 weeks after surgery. 3. Activity restrictions: none 4. Anticipated return to work: not applicable. 5. Follow up: as needed.     [name], MD, FACOG Obstetrician & Gynecologist, [facility] [employer], [facility]

## 2024-05-15 ENCOUNTER — Telehealth: Payer: Self-pay

## 2024-05-15 NOTE — Telephone Encounter (Signed)
 Attempt to reach pt again regarding consent form no answer not able to leave a vm.

## 2024-06-13 ENCOUNTER — Telehealth: Payer: Self-pay | Admitting: *Deleted

## 2024-06-13 NOTE — Telephone Encounter (Signed)
 Telephone call to patient and emergency contact numbers.  Both are non working numbers.  I have emailed patient to see if she will contact us  and come in to the office to sign her hysterectomy statement.

## 2024-07-10 ENCOUNTER — Encounter: Payer: Self-pay | Admitting: *Deleted
